# Patient Record
Sex: Male | Born: 1935
Health system: Southern US, Community
[De-identification: ages and names within clinical notes are randomized; demographics above are authoritative.]

## PROBLEM LIST (undated history)

## (undated) DIAGNOSIS — H269 Unspecified cataract: Secondary | ICD-10-CM

## (undated) DIAGNOSIS — N183 Chronic kidney disease, stage 3 unspecified: Secondary | ICD-10-CM

## (undated) DIAGNOSIS — Z8719 Personal history of other diseases of the digestive system: Secondary | ICD-10-CM

## (undated) DIAGNOSIS — R079 Chest pain, unspecified: Secondary | ICD-10-CM

## (undated) DIAGNOSIS — K219 Gastro-esophageal reflux disease without esophagitis: Secondary | ICD-10-CM

## (undated) DIAGNOSIS — I4891 Unspecified atrial fibrillation: Secondary | ICD-10-CM

## (undated) DIAGNOSIS — I10 Essential (primary) hypertension: Secondary | ICD-10-CM

## (undated) DIAGNOSIS — C4490 Unspecified malignant neoplasm of skin, unspecified: Secondary | ICD-10-CM

## (undated) DIAGNOSIS — F1910 Other psychoactive substance abuse, uncomplicated: Secondary | ICD-10-CM

## (undated) DIAGNOSIS — K2270 Barrett's esophagus without dysplasia: Secondary | ICD-10-CM

## (undated) DIAGNOSIS — E785 Hyperlipidemia, unspecified: Secondary | ICD-10-CM

## (undated) HISTORY — DX: Gastro-esophageal reflux disease without esophagitis: K21.9

## (undated) HISTORY — DX: Personal history of other diseases of the digestive system: Z87.19

## (undated) HISTORY — PX: UPPER GASTROINTESTINAL ENDOSCOPY: SHX188

## (undated) HISTORY — DX: Unspecified malignant neoplasm of skin, unspecified: C44.90

## (undated) HISTORY — DX: Hyperlipidemia, unspecified: E78.5

## (undated) HISTORY — PX: WISDOM TOOTH EXTRACTION: SHX21

## (undated) HISTORY — PX: COLONOSCOPY: SHX174

## (undated) HISTORY — DX: Essential (primary) hypertension: I10

## (undated) HISTORY — DX: Chronic kidney disease, stage 3 (moderate): N18.3

## (undated) HISTORY — DX: Chest pain, unspecified: R07.9

## (undated) HISTORY — DX: Chronic kidney disease, stage 3 unspecified: N18.30

## (undated) HISTORY — DX: Other psychoactive substance abuse, uncomplicated: F19.10

## (undated) HISTORY — DX: Unspecified cataract: H26.9

## (undated) HISTORY — DX: Barrett's esophagus without dysplasia: K22.70

## (undated) HISTORY — DX: Unspecified atrial fibrillation: I48.91

## (undated) HISTORY — PX: SKIN CANCER EXCISION: SHX779

---

## 1978-12-06 HISTORY — PX: WISDOM TOOTH EXTRACTION: SHX21

## 2005-04-07 ENCOUNTER — Ambulatory Visit: Payer: Self-pay | Admitting: Family Medicine

## 2005-04-23 ENCOUNTER — Ambulatory Visit: Payer: Self-pay | Admitting: Family Medicine

## 2005-05-07 ENCOUNTER — Ambulatory Visit: Payer: Self-pay | Admitting: Family Medicine

## 2005-11-22 ENCOUNTER — Ambulatory Visit: Payer: Self-pay | Admitting: Family Medicine

## 2005-12-15 ENCOUNTER — Ambulatory Visit: Payer: Self-pay | Admitting: Internal Medicine

## 2005-12-16 ENCOUNTER — Ambulatory Visit: Payer: Self-pay | Admitting: Internal Medicine

## 2005-12-16 ENCOUNTER — Encounter (INDEPENDENT_AMBULATORY_CARE_PROVIDER_SITE_OTHER): Payer: Self-pay | Admitting: Specialist

## 2007-12-08 ENCOUNTER — Ambulatory Visit: Payer: Self-pay | Admitting: Internal Medicine

## 2007-12-22 ENCOUNTER — Ambulatory Visit: Payer: Self-pay | Admitting: Internal Medicine

## 2007-12-22 ENCOUNTER — Encounter: Payer: Self-pay | Admitting: Internal Medicine

## 2008-08-02 ENCOUNTER — Encounter: Admission: RE | Admit: 2008-08-02 | Discharge: 2008-08-02 | Payer: Self-pay | Admitting: Internal Medicine

## 2008-12-06 HISTORY — PX: KNEE ARTHROSCOPY: SUR90

## 2009-03-05 ENCOUNTER — Encounter: Admission: RE | Admit: 2009-03-05 | Discharge: 2009-03-05 | Payer: Self-pay | Admitting: Orthopaedic Surgery

## 2009-12-09 ENCOUNTER — Encounter (INDEPENDENT_AMBULATORY_CARE_PROVIDER_SITE_OTHER): Payer: Self-pay | Admitting: *Deleted

## 2010-01-19 ENCOUNTER — Encounter (INDEPENDENT_AMBULATORY_CARE_PROVIDER_SITE_OTHER): Payer: Self-pay | Admitting: *Deleted

## 2010-01-21 ENCOUNTER — Ambulatory Visit: Payer: Self-pay | Admitting: Internal Medicine

## 2010-02-02 ENCOUNTER — Ambulatory Visit: Payer: Self-pay | Admitting: Internal Medicine

## 2010-02-03 ENCOUNTER — Encounter: Payer: Self-pay | Admitting: Internal Medicine

## 2010-12-06 HISTORY — PX: CATARACT EXTRACTION, BILATERAL: SHX1313

## 2011-01-05 NOTE — Letter (Signed)
Summary: EGD Instructions  [facility]  [address] [place], [place] [address]   Phone: [phone]  Fax: [phone]       DAI MCADAMS    07-22-1934    MRN: [id number]       Procedure Day /Date:  Monday 02/02/2010     Arrival Time: 8:00 am     Procedure Time: 9:00 am     Location of Procedure:                    _x  _ [facility] (4th Floor)    PREPARATION FOR ENDOSCOPY   OnMonday 2/28  THE DAY OF THE PROCEDURE:  1.   No solid foods, milk or milk products are allowed after midnight the night before your procedure.  2.   Do not drink anything colored red or purple.  Avoid juices with pulp.  No orange juice.  3.  You may drink clear liquids until 7:00 am, which is 2 hours before your procedure.                                                                                                CLEAR LIQUIDS INCLUDE: Water Jello Ice Popsicles Tea (sugar ok, no milk/cream) Powdered fruit flavored drinks Coffee (sugar ok, no milk/cream) Gatorade Juice: apple, white grape, white cranberry  Lemonade Clear bullion, consomm, broth Carbonated beverages (any kind) Strained chicken noodle soup Hard Candy   MEDICATION INSTRUCTIONS  Unless otherwise instructed, you should take regular prescription medications with a small sip of water as early as possible the morning of your procedure.              OTHER INSTRUCTIONS  You will need a responsible adult at least 75 years of age to accompany you and drive you home.   This person must remain in the waiting room during your procedure.  Wear loose fitting clothing that is easily removed.  Leave jewelry and other valuables at home.  However, you may wish to bring a book to read or an iPod/MP3 player to listen to music as you wait for your procedure to start.  Remove all body piercing jewelry and leave at home.  Total time from sign-in until discharge is approximately 2-3 hours.  You  should go home directly after your procedure and rest.  You can resume normal activities the day after your procedure.  The day of your procedure you should not:   Drive   Make legal decisions   Operate machinery   Drink alcohol   Return to work  You will receive specific instructions about eating, activities and medications before you leave.    The above instructions have been reviewed and explained to me by   Ezra Sites RN  January 21, 2010 8:09 AM    I fully understand and can verbalize these instructions _____________________________ Date _________

## 2011-01-05 NOTE — Letter (Signed)
Summary: Patient Notice-[facility]  [address] [place], [place] [address]   Phone: [phone]  Fax: [phone]        February 03, 2010 MRN: [id number]    ISHAAQ PENNA [address] [place], [place]  [address]    Dear Mr. VISCOMI,  I am pleased to inform you that the biopsies taken during your recent endoscopic examination did not show any evidence of cancer upon pathologic examination.  However, your biopsies indicate you have a condition known as Barrett's esophagus. While not cancer, it is pre-cancerous (can progress to cancer) and needs to be monitored with repeat endoscopic examination and biopsies.  Fortunately, it is quite rare that this develops into cancer, but careful monitoring of the condition along with taking your medication as prescribed is important in reducing the risk of developing cancer.  It is my recommendation that you have a repeat upper gastrointestinal endoscopic examination in 2_ years.  Additional information/recommendations:  __Please call [phone] to schedule a return visit to further      evaluate your condition.  _x_Continue with treatment plan as outlined the day of your exam.  Please call us if you have or develop heartburn, reflux symptoms, any swallowing problems, or if you have questions about your condition that have not been fully answered at this time.  Sincerely,  Hart Carwin MD  This letter has been electronically signed by your physician.  Appended Document: Patient Notice-Barrett's Esopghagus letter mailed 3.3.11

## 2011-01-05 NOTE — Procedures (Signed)
Summary: Upper Endoscopy  Patient: [name] Note: All result statuses are Final unless otherwise noted.  Tests: (1) Upper Endoscopy (EGD)   EGD Upper Endoscopy       DONE     [facility]     [address]     [place], [place]  [address]           ENDOSCOPY PROCEDURE REPORT           PATIENT:  David Bush, David Bush  MR#:  [id number]     BIRTHDATE:  [birth year], 75 yrs. old  GENDER:  male           ENDOSCOPIST:  Hedwig Morton. Juanda Chance, MD     Referred by:  Burton Apley, M.D.           PROCEDURE DATE:  02/02/2010     PROCEDURE:  EGD with biopsy     ASA CLASS:  Class II     INDICATIONS:  Barrett's esophagus on EGD 2007 and again in 12/2007           doing well on Nexiem 40 mg qd           MEDICATIONS:   Versed 7 mg, Fentanyl 50 mcg     TOPICAL ANESTHETIC:  Exactacain Spray           DESCRIPTION OF PROCEDURE:   After the risks benefits and     alternatives of the procedure were thoroughly explained, informed     consent was obtained.  The LB GIF-H180 [id number] endoscope was     introduced through the mouth and advanced to the second portion of     the duodenum, without limitations.  The instrument was slowly     withdrawn as the mucosa was fully examined.     <<PROCEDUREIMAGES>>           Barrett's esophagus was found in the distal esophagus. irregular     z-line, 4 cm Barrett's 38- 42 cm Multiple biopsies were obtained     and sent to pathology (see image1, image7, image6, and image8).     polyps, # of Two polyps were found. fundic gland polyps With     standard forceps, a biopsy was obtained and sent to pathology (see     image5).  Otherwise the examination was normal (see image2,     image3, and image4).    Retroflexed views revealed no     abnormalities.    The scope was then withdrawn from the patient     and the procedure completed.           COMPLICATIONS:  None           ENDOSCOPIC IMPRESSION:     1) Barrett's esophagus in the distal esophagus     2) Polyps,  # of Two polyps     3) Otherwise normal examination     3-4 cm Barrett's esophagus, s/p biopsies to r/o dysplasia     RECOMMENDATIONS:     1) Await biopsy results     2) Anti-reflux regimen to be follow     continue Nexiem 40 mg qd infefinitely           REPEAT EXAM:  In 2 year(s) for.           ______________________________     Hedwig Morton. Juanda Chance, MD           CC:           n.  eSIGNED:   Hedwig Morton. Louretta Tantillo at 02/02/2010 09:35 AM           Foye Spurling, [id number]  Note: An exclamation mark (!) indicates a result that was not dispersed into the flowsheet. Document Creation Date: 02/02/2010 9:36 AM _______________________________________________________________________  (1) Order result status: Final Collection or observation date-time: 02/02/2010 09:25 Requested date-time:  Receipt date-time:  Reported date-time:  Referring Physician:   Ordering Physician: Lina Sar [phone]) Specimen Source:  Source: Launa Grill Order Number: [id number] Lab site:   Appended Document: Upper Endoscopy     Procedures Next Due Date:    EGD: 02/2012

## 2011-01-05 NOTE — Letter (Signed)
Summary: Endoscopy Letter  [facility]  [address] [place], [place] [address]   Phone: [phone]  Fax: [phone]      December 09, 2009 MRN: [id number]   [name] [address] [place], [place]  [address]   Dear Mr. SOLORIO,   According to your medical record, it is time for you to schedule an Endoscopy. Endoscopic screening is recommended for patients with certain upper digestive tract conditions because of associated increased risk for cancers of the upper digestive system.  This letter has been generated based on the recommendations made at the time of your prior procedure. If you feel that in your particular situation this may no longer apply, please contact our office.  Please call our office at [phone]) to schedule this appointment or to update your records at your earliest convenience.  Thank you for cooperating with [place] to provide you with the very best care possible.   Sincerely,  Hedwig Morton. Juanda Chance, M.D.  [facility] [phone]

## 2011-01-05 NOTE — Miscellaneous (Signed)
Summary: [facility]  Clinical Lists Changes  Observations: Added new observation of NKA: T (01/21/2010 7:51)

## 2011-11-26 ENCOUNTER — Other Ambulatory Visit: Payer: Self-pay | Admitting: Dermatology

## 2012-01-27 ENCOUNTER — Encounter: Payer: Self-pay | Admitting: Internal Medicine

## 2012-02-25 ENCOUNTER — Ambulatory Visit (AMBULATORY_SURGERY_CENTER): Payer: Medicare Other | Admitting: *Deleted

## 2012-02-25 VITALS — Ht 73.0 in | Wt 210.0 lb

## 2012-02-25 DIAGNOSIS — K2270 Barrett's esophagus without dysplasia: Secondary | ICD-10-CM

## 2012-03-10 ENCOUNTER — Ambulatory Visit (AMBULATORY_SURGERY_CENTER): Payer: Medicare Other | Admitting: Internal Medicine

## 2012-03-10 ENCOUNTER — Encounter: Payer: Self-pay | Admitting: Internal Medicine

## 2012-03-10 VITALS — BP 127/72 | HR 55 | Temp 98.2°F | Resp 20 | Ht 73.0 in | Wt 210.0 lb

## 2012-03-10 DIAGNOSIS — K2270 Barrett's esophagus without dysplasia: Secondary | ICD-10-CM

## 2012-03-10 MED ORDER — SODIUM CHLORIDE 0.9 % IV SOLN: 500.0000 mL | INTRAVENOUS | Status: DC

## 2012-03-10 NOTE — Progress Notes (Signed)
Patient did not have preoperative order for IV antibiotic SSI prophylaxis. (G8918)Patient did not experience any of the following events: a burn prior to discharge; a fall within the facility; wrong site/side/patient/procedure/implant event; or a hospital transfer or hospital admission upon discharge from the facility. (G8907)Patient did not experience any of the following events: a burn prior to discharge; a fall within the facility; wrong site/side/patient/procedure/implant event; or a hospital transfer or hospital admission upon discharge from the facility. (G8907) 

## 2012-03-10 NOTE — Patient Instructions (Signed)
YOU HAD AN ENDOSCOPIC PROCEDURE TODAY AT THE [facility]: Refer to the procedure report that was given to you for any specific questions about what was found during the examination.  If the procedure report does not answer your questions, please call your gastroenterologist to clarify.  If you requested that your care partner not be given the details of your procedure findings, then the procedure report has been included in a sealed envelope for you to review at your convenience later.  YOU SHOULD EXPECT: Some feelings of bloating in the abdomen. Passage of more gas than usual.  Walking can help get rid of the air that was put into your GI tract during the procedure and reduce the bloating. If you had a lower endoscopy (such as a colonoscopy or flexible sigmoidoscopy) you may notice spotting of blood in your stool or on the toilet paper. If you underwent a bowel prep for your procedure, then you may not have a normal bowel movement for a few days.  DIET: Your first meal following the procedure should be a light meal and then it is ok to progress to your normal diet.  A half-sandwich or bowl of soup is an example of a good first meal.  Heavy or fried foods are harder to digest and may make you feel nauseous or bloated.  Likewise meals heavy in dairy and vegetables can cause extra gas to form and this can also increase the bloating.  Drink plenty of fluids but you should avoid alcoholic beverages for 24 hours.  ACTIVITY: Your care partner should take you home directly after the procedure.  You should plan to take it easy, moving slowly for the rest of the day.  You can resume normal activity the day after the procedure however you should NOT DRIVE or use heavy machinery for 24 hours (because of the sedation medicines used during the test).    SYMPTOMS TO REPORT IMMEDIATELY: A gastroenterologist can be reached at any hour.  During normal business hours, 8:30 AM to 5:00 PM Monday through Friday,  call [phone].  After hours and on weekends, please call the GI answering service at [phone] who will take a message and have the physician on call contact you.   Following lower endoscopy (colonoscopy or flexible sigmoidoscopy):  Excessive amounts of blood in the stool  Significant tenderness or worsening of abdominal pains  Swelling of the abdomen that is new, acute  Fever of 100F or higher  Following upper endoscopy (EGD)  Vomiting of blood or coffee ground material  New chest pain or pain under the shoulder blades  Painful or persistently difficult swallowing  New shortness of breath  Fever of 100F or higher  Black, tarry-looking stools  FOLLOW UP: If any biopsies were taken you will be contacted by phone or by letter within the next 1-3 weeks.  Call your gastroenterologist if you have not heard about the biopsies in 3 weeks.  Our staff will call the home number listed on your records the next business day following your procedure to check on you and address any questions or concerns that you may have at that time regarding the information given to you following your procedure. This is a courtesy call and so if there is no answer at the home number and we have not heard from you through the emergency physician on call, we will assume that you have returned to your regular daily activities without incident.  SIGNATURES/CONFIDENTIALITY: You and/or your care   partner have signed paperwork which will be entered into your electronic medical record.  These signatures attest to the fact that that the information above on your After Visit Summary has been reviewed and is understood.  Full responsibility of the confidentiality of this discharge information lies with you and/or your care-partner.  

## 2012-03-10 NOTE — Op Note (Signed)
[facility] [address] [place], [place]  [address]  ENDOSCOPY PROCEDURE REPORT  PATIENT:  David Bush, David Bush  MR#:  [id number] BIRTHDATE:  1934/06/21, 77 yrs. old  GENDER:  male  ENDOSCOPIST:  Hedwig Morton. Juanda Chance, MD Referred by:  Burton Apley, M.D.  PROCEDURE DATE:  03/10/2012 PROCEDURE:  EGD with biopsy, [address] ASA CLASS:  Class II INDICATIONS:  h/o Barrett's Esophagus EGD 2007,2009, 2011 showed Barrett's  MEDICATIONS:   MAC sedation, administered by CRNA, propofol (Diprivan) 120 mg TOPICAL ANESTHETIC:  none  DESCRIPTION OF PROCEDURE:   After the risks benefits and alternatives of the procedure were thoroughly explained, informed consent was obtained.  The LB GIF-H180 [id number] endoscope was introduced through the mouth and advanced to the second portion of the duodenum, without limitations.  The instrument was slowly withdrawn as the mucosa was fully examined. <<PROCEDUREIMAGES>>  irregular Z-line. no esophagitis or a stricture, Multiple biopsies were obtained and sent to pathology (see image1 and image5). r/o barrett's  Otherwise the examination was normal (see image4, image3, and image2).    Retroflexed views revealed no abnormalities.    The scope was then withdrawn from the patient and the procedure completed.  COMPLICATIONS:  None  ENDOSCOPIC IMPRESSION: 1) Irregular Z-line 2) Otherwise normal examination Barrett's esophagus - irregular z-line RECOMMENDATIONS: 1) Await pathology results 2) Anti-reflux regimen to be follow cont Nexiem 40 mg po qd  REPEAT EXAM:  In 2 year(s) for.  ______________________________ Hedwig Morton. Juanda Chance, MD  CC:  n. eSIGNED:   Hedwig Morton. Earl Losee at 03/10/2012 11:29 AM  Foye Spurling, [id number]

## 2012-03-13 ENCOUNTER — Telehealth: Payer: Self-pay | Admitting: *Deleted

## 2012-03-13 NOTE — Telephone Encounter (Signed)
  Follow up Call-  Call back number 03/10/2012  Post procedure Call Back phone  # [phone]  Permission to leave phone message Yes     Patient questions:  Do you have a fever, pain , or abdominal swelling? no Pain Score  0 *  Have you tolerated food without any problems? yes  Have you been able to return to your normal activities? yes  Do you have any questions about your discharge instructions: Diet   no Medications  no Follow up visit  no  Do you have questions or concerns about your Care? no  Actions: * If pain score is 4 or above: No action needed, pain <4.

## 2012-03-14 ENCOUNTER — Encounter: Payer: Self-pay | Admitting: Internal Medicine

## 2013-03-07 ENCOUNTER — Other Ambulatory Visit: Payer: Self-pay | Admitting: Dermatology

## 2013-09-07 ENCOUNTER — Other Ambulatory Visit: Payer: Self-pay | Admitting: Dermatology

## 2014-01-01 ENCOUNTER — Other Ambulatory Visit: Payer: Self-pay | Admitting: Dermatology

## 2014-02-12 ENCOUNTER — Encounter: Payer: Self-pay | Admitting: Internal Medicine

## 2014-02-19 ENCOUNTER — Encounter: Payer: Self-pay | Admitting: Internal Medicine

## 2014-03-22 ENCOUNTER — Other Ambulatory Visit: Payer: Self-pay | Admitting: Dermatology

## 2014-04-10 ENCOUNTER — Ambulatory Visit (AMBULATORY_SURGERY_CENTER): Payer: Self-pay | Admitting: *Deleted

## 2014-04-10 VITALS — Ht 73.0 in | Wt 214.4 lb

## 2014-04-10 DIAGNOSIS — K2270 Barrett's esophagus without dysplasia: Secondary | ICD-10-CM

## 2014-04-10 NOTE — Progress Notes (Signed)
No allergies to eggs or soy. No problems with anesthesia.  Pt given Emmi instructions for colonoscopy  No oxygen use  No diet drug use  

## 2014-04-24 ENCOUNTER — Ambulatory Visit (AMBULATORY_SURGERY_CENTER): Payer: Medicare Other | Admitting: Internal Medicine

## 2014-04-24 ENCOUNTER — Encounter: Payer: Self-pay | Admitting: Internal Medicine

## 2014-04-24 VITALS — BP 117/66 | HR 51 | Temp 96.5°F | Resp 16 | Ht 73.0 in | Wt 210.0 lb

## 2014-04-24 DIAGNOSIS — K2270 Barrett's esophagus without dysplasia: Secondary | ICD-10-CM

## 2014-04-24 MED ORDER — SODIUM CHLORIDE 0.9 % IV SOLN: 500.0000 mL | INTRAVENOUS | Status: DC

## 2014-04-24 NOTE — Patient Instructions (Signed)
Discharge instructions given with verbal understanding. Biopsies taken. Resume previous medications. YOU HAD AN ENDOSCOPIC PROCEDURE TODAY AT THE [facility]: Refer to the procedure report that was given to you for any specific questions about what was found during the examination.  If the procedure report does not answer your questions, please call your gastroenterologist to clarify.  If you requested that your care partner not be given the details of your procedure findings, then the procedure report has been included in a sealed envelope for you to review at your convenience later.  YOU SHOULD EXPECT: Some feelings of bloating in the abdomen. Passage of more gas than usual.  Walking can help get rid of the air that was put into your GI tract during the procedure and reduce the bloating. If you had a lower endoscopy (such as a colonoscopy or flexible sigmoidoscopy) you may notice spotting of blood in your stool or on the toilet paper. If you underwent a bowel prep for your procedure, then you may not have a normal bowel movement for a few days.  DIET: Your first meal following the procedure should be a light meal and then it is ok to progress to your normal diet.  A half-sandwich or bowl of soup is an example of a good first meal.  Heavy or fried foods are harder to digest and may make you feel nauseous or bloated.  Likewise meals heavy in dairy and vegetables can cause extra gas to form and this can also increase the bloating.  Drink plenty of fluids but you should avoid alcoholic beverages for 24 hours.  ACTIVITY: Your care partner should take you home directly after the procedure.  You should plan to take it easy, moving slowly for the rest of the day.  You can resume normal activity the day after the procedure however you should NOT DRIVE or use heavy machinery for 24 hours (because of the sedation medicines used during the test).    SYMPTOMS TO REPORT IMMEDIATELY: A gastroenterologist  can be reached at any hour.  During normal business hours, 8:30 AM to 5:00 PM Monday through Friday, call [phone].  After hours and on weekends, please call the GI answering service at [phone] who will take a message and have the physician on call contact you.   Following upper endoscopy (EGD)  Vomiting of blood or coffee ground material  New chest pain or pain under the shoulder blades  Painful or persistently difficult swallowing  New shortness of breath  Fever of 100F or higher  Black, tarry-looking stools  FOLLOW UP: If any biopsies were taken you will be contacted by phone or by letter within the next 1-3 weeks.  Call your gastroenterologist if you have not heard about the biopsies in 3 weeks.  Our staff will call the home number listed on your records the next business day following your procedure to check on you and address any questions or concerns that you may have at that time regarding the information given to you following your procedure. This is a courtesy call and so if there is no answer at the home number and we have not heard from you through the emergency physician on call, we will assume that you have returned to your regular daily activities without incident.  SIGNATURES/CONFIDENTIALITY: You and/or your care partner have signed paperwork which will be entered into your electronic medical record.  These signatures attest to the fact that that the information above on your After   Visit Summary has been reviewed and is understood.  Full responsibility of the confidentiality of this discharge information lies with you and/or your care-partner. 

## 2014-04-24 NOTE — Progress Notes (Signed)
Called to room to assist during endoscopic procedure.  Patient ID and intended procedure confirmed with present staff. Received instructions for my participation in the procedure from the performing physician.  

## 2014-04-24 NOTE — Op Note (Signed)
[facility]  [address]. [place], [address]   ENDOSCOPY PROCEDURE REPORT  PATIENT: David Bush, David Bush  MR#: [id number] BIRTHDATE: 1934-03-10 , 79  yrs. old GENDER: Male ENDOSCOPIST: Lafayette Dragon, MD REFERRED BY:  Lorene Dy, M.D. PROCEDURE DATE:  04/24/2014 PROCEDURE:  EGD w/ biopsy ASA CLASS:     Class II INDICATIONS:  Barrett's esophagus on prior endoscopies in 2007, 2011 and in April 2013.  Patient remains asymptomatic on Nexium 40 mg every morning. MEDICATIONS: MAC sedation, administered by CRNA and propofol (Diprivan) 100mg  IV TOPICAL ANESTHETIC: none  DESCRIPTION OF PROCEDURE: After the risks benefits and alternatives of the procedure were thoroughly explained, informed consent was obtained.  The LB [id number]-[id number] endoscope was introduced through the mouth and advanced to the second portion of the duodenum. Without limitations.  The instrument was slowly withdrawn as the mucosa was fully examined.      Esophagus: Proximal and mid esophageal mucosa appeared normal. Z line was very irregular, there were tones of gastric mucosa extending 2-3 cm into the esophagus. There was no stricture or active esophagitis. Multiple 4 quadrant biopsies were obtained to rule out dysplasia.[NBI confirmed presence of Barrett's esophagus Stomach: Rugal folds were unremarkable. Gastric antrum show few superficial erosions. Gastric outlet was normal. Retroflexion of the endoscope revealed normal fundus and cardia duodenum: Duodenal bulb and descending duodenum was normal          The scope was then withdrawn from the patient and the procedure completed.  COMPLICATIONS: There were no complications. ENDOSCOPIC IMPRESSION: short segment Barrett's esophagus. Status post biopsies to rule out dysplasia Minimal antral gastritis RECOMMENDATIONS: 1.  Await pathology results 2.  Anti-reflux regimen to be follow 3.  Continue PPI  REPEAT EXAM: for EGD pending  biopsy results.,probably in 2 years  eSigned:  Lafayette Dragon, MD 04/24/2014 10:01 AM   CC:  PATIENT NAME:  David Bush, David Bush MR#: [id number]

## 2014-04-25 ENCOUNTER — Telehealth: Payer: Self-pay | Admitting: *Deleted

## 2014-04-25 NOTE — Telephone Encounter (Signed)
  Follow up Call-  Call back number 04/24/2014 03/10/2012  Post procedure Call Back phone  # [phone]  Permission to leave phone message Yes Yes     Patient questions:  Do you have a fever, pain , or abdominal swelling? no Pain Score  0 *  Have you tolerated food without any problems? yes  Have you been able to return to your normal activities? yes  Do you have any questions about your discharge instructions: Diet   no Medications  no Follow up visit  no  Do you have questions or concerns about your Care? no  Actions: * If pain score is 4 or above: No action needed, pain <4.

## 2014-04-30 ENCOUNTER — Encounter: Payer: Self-pay | Admitting: Internal Medicine

## 2014-05-01 ENCOUNTER — Encounter: Payer: Self-pay | Admitting: *Deleted

## 2014-05-03 ENCOUNTER — Encounter: Payer: Self-pay | Admitting: Internal Medicine

## 2014-07-11 ENCOUNTER — Other Ambulatory Visit: Payer: Self-pay | Admitting: Dermatology

## 2015-01-01 DIAGNOSIS — J029 Acute pharyngitis, unspecified: Secondary | ICD-10-CM | POA: Diagnosis not present

## 2015-01-01 DIAGNOSIS — J209 Acute bronchitis, unspecified: Secondary | ICD-10-CM | POA: Diagnosis not present

## 2015-01-28 DIAGNOSIS — J209 Acute bronchitis, unspecified: Secondary | ICD-10-CM | POA: Diagnosis not present

## 2015-01-28 DIAGNOSIS — I10 Essential (primary) hypertension: Secondary | ICD-10-CM | POA: Diagnosis not present

## 2015-01-29 ENCOUNTER — Ambulatory Visit
Admission: RE | Admit: 2015-01-29 | Discharge: 2015-01-29 | Disposition: A | Discharge status: Home / Self Care | Payer: Medicare Other | Source: Ambulatory Visit | Attending: Internal Medicine | Admitting: Internal Medicine

## 2015-01-29 ENCOUNTER — Other Ambulatory Visit: Payer: Self-pay | Admitting: Internal Medicine

## 2015-01-29 DIAGNOSIS — R05 Cough: Secondary | ICD-10-CM

## 2015-01-29 DIAGNOSIS — R059 Cough, unspecified: Secondary | ICD-10-CM

## 2015-01-29 IMAGING — CR DG CHEST 2V
2 series · 2 of 2 positions shown · non-contrast
Comparison: [DATE]

CLINICAL DATA: Persistent cough for 1 month

EXAM:
CHEST  2 VIEW

[w chest pa]
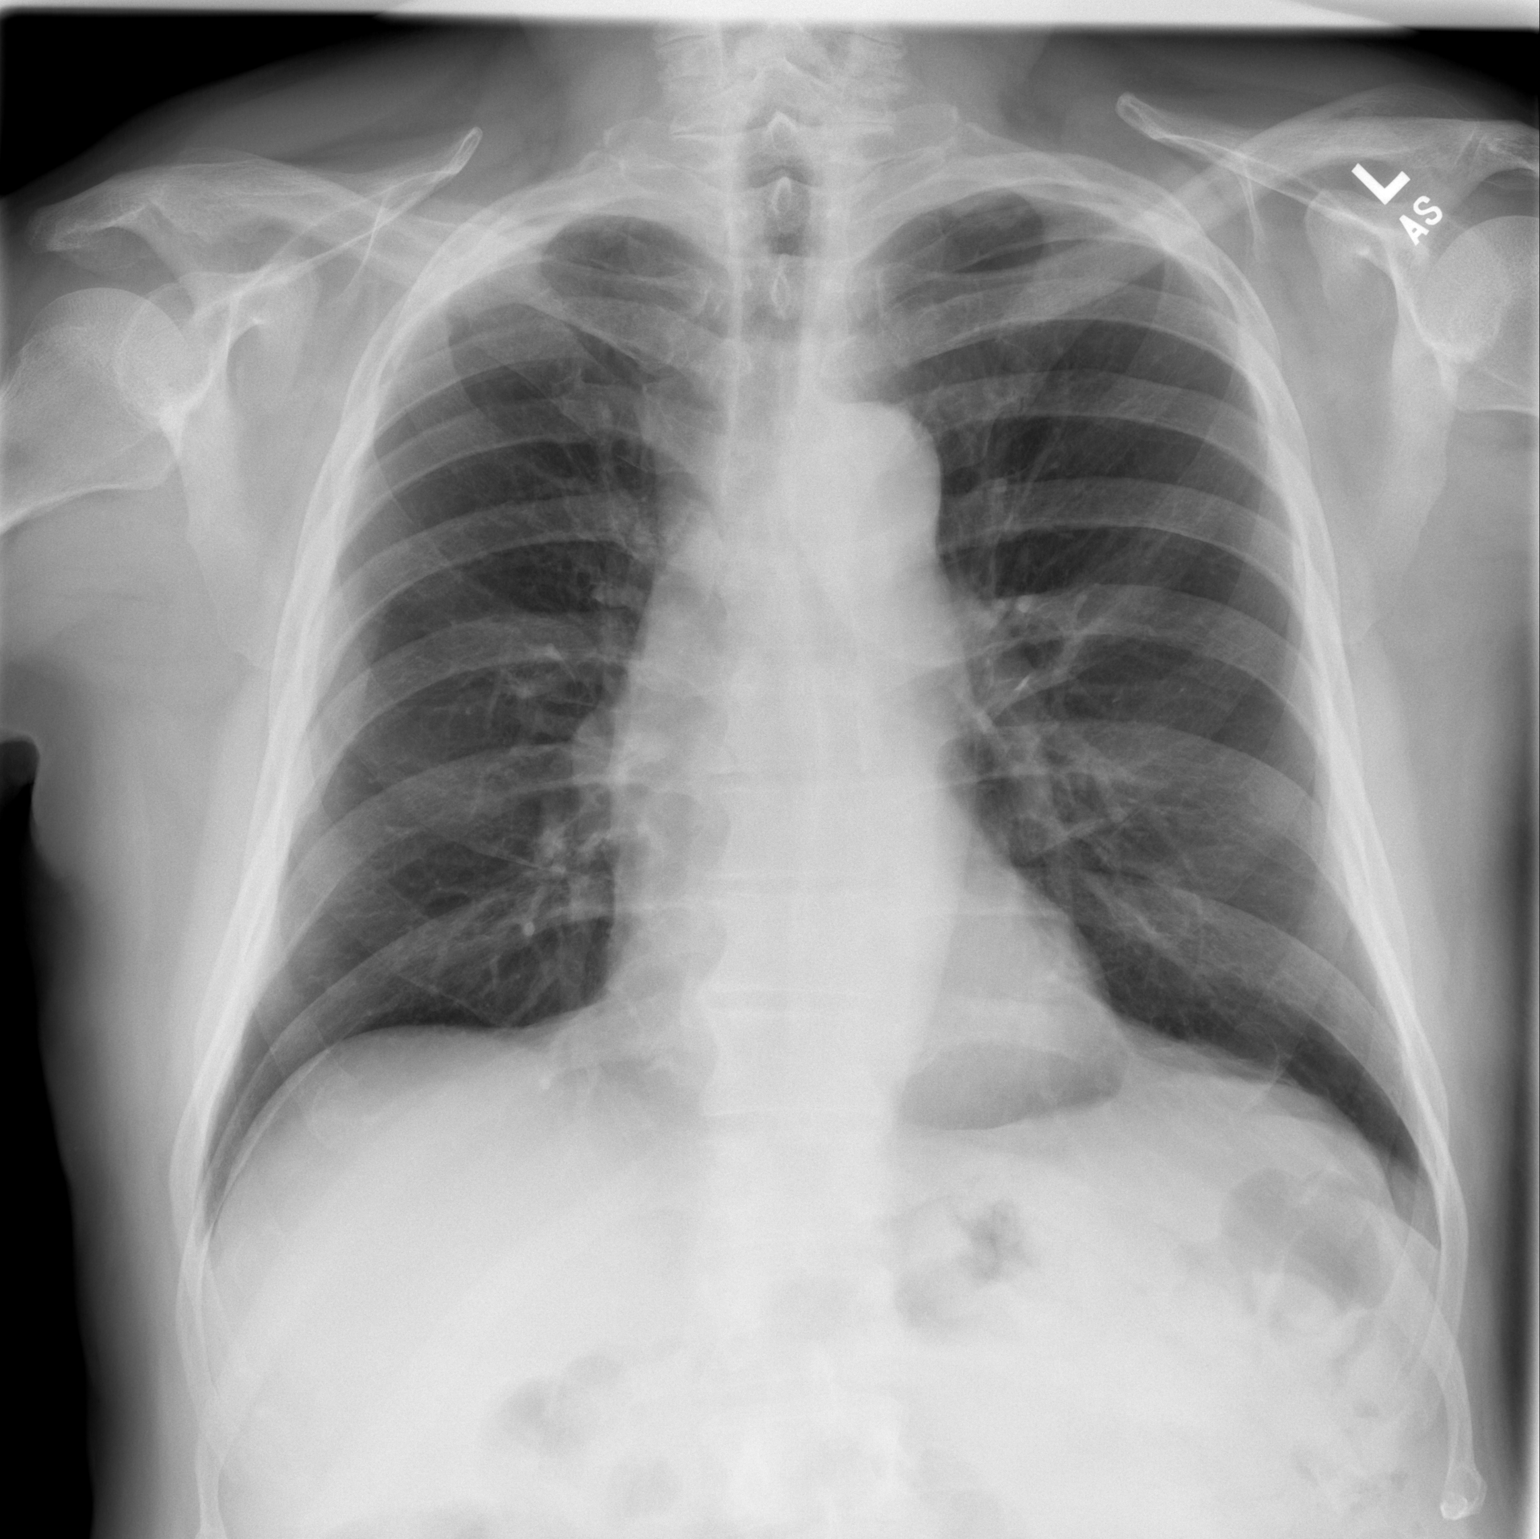

[w chest lat]
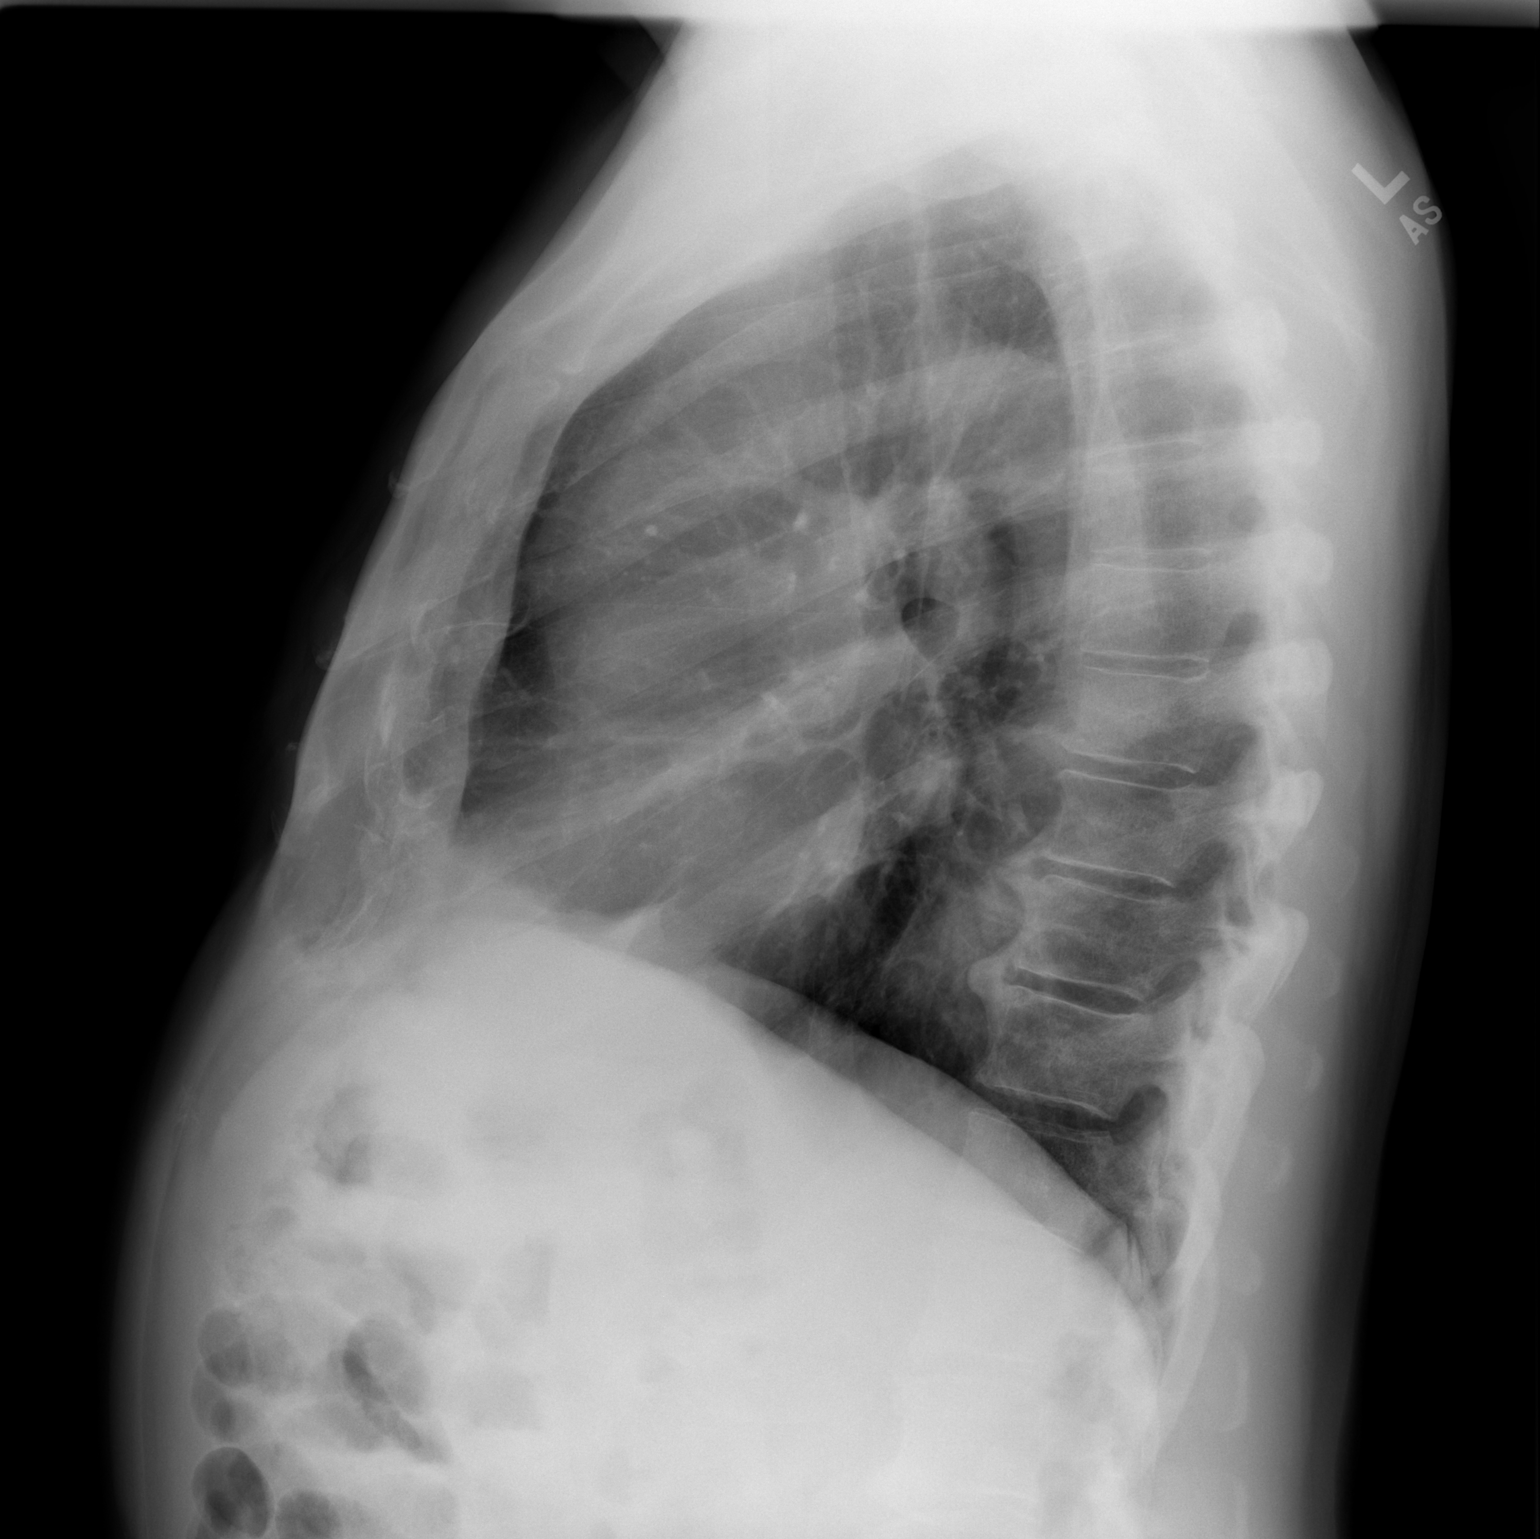

[2 of 2 positions shown; findings below may reference images not displayed]

FINDINGS: Cardiomediastinal silhouette is unremarkable. No acute infiltrate or
pleural effusion. No pulmonary edema. Degenerative changes lower
thoracic spine.
IMPRESSION: No active cardiopulmonary disease.

## 2015-02-10 DIAGNOSIS — N181 Chronic kidney disease, stage 1: Secondary | ICD-10-CM | POA: Diagnosis not present

## 2015-02-10 DIAGNOSIS — R972 Elevated prostate specific antigen [PSA]: Secondary | ICD-10-CM | POA: Diagnosis not present

## 2015-03-12 ENCOUNTER — Other Ambulatory Visit: Payer: Self-pay | Admitting: Dermatology

## 2015-03-12 DIAGNOSIS — L570 Actinic keratosis: Secondary | ICD-10-CM | POA: Diagnosis not present

## 2015-03-12 DIAGNOSIS — C44629 Squamous cell carcinoma of skin of left upper limb, including shoulder: Secondary | ICD-10-CM | POA: Diagnosis not present

## 2015-03-12 DIAGNOSIS — D485 Neoplasm of uncertain behavior of skin: Secondary | ICD-10-CM | POA: Diagnosis not present

## 2015-03-12 DIAGNOSIS — M795 Residual foreign body in soft tissue: Secondary | ICD-10-CM | POA: Diagnosis not present

## 2015-04-03 ENCOUNTER — Other Ambulatory Visit: Payer: Self-pay | Admitting: Dermatology

## 2015-04-03 DIAGNOSIS — C44629 Squamous cell carcinoma of skin of left upper limb, including shoulder: Secondary | ICD-10-CM | POA: Diagnosis not present

## 2015-04-30 DIAGNOSIS — L820 Inflamed seborrheic keratosis: Secondary | ICD-10-CM | POA: Diagnosis not present

## 2015-04-30 DIAGNOSIS — Z85828 Personal history of other malignant neoplasm of skin: Secondary | ICD-10-CM | POA: Diagnosis not present

## 2015-04-30 DIAGNOSIS — L570 Actinic keratosis: Secondary | ICD-10-CM | POA: Diagnosis not present

## 2015-04-30 DIAGNOSIS — L821 Other seborrheic keratosis: Secondary | ICD-10-CM | POA: Diagnosis not present

## 2015-05-12 DIAGNOSIS — N189 Chronic kidney disease, unspecified: Secondary | ICD-10-CM | POA: Diagnosis not present

## 2015-05-12 DIAGNOSIS — R972 Elevated prostate specific antigen [PSA]: Secondary | ICD-10-CM | POA: Diagnosis not present

## 2015-05-12 DIAGNOSIS — I10 Essential (primary) hypertension: Secondary | ICD-10-CM | POA: Diagnosis not present

## 2015-08-25 DIAGNOSIS — I70219 Atherosclerosis of native arteries of extremities with intermittent claudication, unspecified extremity: Secondary | ICD-10-CM | POA: Diagnosis not present

## 2015-08-25 DIAGNOSIS — R002 Palpitations: Secondary | ICD-10-CM | POA: Diagnosis not present

## 2015-08-25 DIAGNOSIS — Z79899 Other long term (current) drug therapy: Secondary | ICD-10-CM | POA: Diagnosis not present

## 2015-08-25 DIAGNOSIS — Z1389 Encounter for screening for other disorder: Secondary | ICD-10-CM | POA: Diagnosis not present

## 2015-08-25 DIAGNOSIS — H9113 Presbycusis, bilateral: Secondary | ICD-10-CM | POA: Diagnosis not present

## 2015-08-25 DIAGNOSIS — R42 Dizziness and giddiness: Secondary | ICD-10-CM | POA: Diagnosis not present

## 2015-08-25 DIAGNOSIS — Z0000 Encounter for general adult medical examination without abnormal findings: Secondary | ICD-10-CM | POA: Diagnosis not present

## 2015-08-25 DIAGNOSIS — I10 Essential (primary) hypertension: Secondary | ICD-10-CM | POA: Diagnosis not present

## 2015-08-25 DIAGNOSIS — Z1211 Encounter for screening for malignant neoplasm of colon: Secondary | ICD-10-CM | POA: Diagnosis not present

## 2015-08-25 DIAGNOSIS — E785 Hyperlipidemia, unspecified: Secondary | ICD-10-CM | POA: Diagnosis not present

## 2015-08-25 DIAGNOSIS — Z131 Encounter for screening for diabetes mellitus: Secondary | ICD-10-CM | POA: Diagnosis not present

## 2015-08-25 DIAGNOSIS — E780 Pure hypercholesterolemia: Secondary | ICD-10-CM | POA: Diagnosis not present

## 2015-08-25 DIAGNOSIS — R1032 Left lower quadrant pain: Secondary | ICD-10-CM | POA: Diagnosis not present

## 2015-08-25 DIAGNOSIS — R5383 Other fatigue: Secondary | ICD-10-CM | POA: Diagnosis not present

## 2015-08-25 DIAGNOSIS — Z125 Encounter for screening for malignant neoplasm of prostate: Secondary | ICD-10-CM | POA: Diagnosis not present

## 2015-09-03 DIAGNOSIS — Z23 Encounter for immunization: Secondary | ICD-10-CM | POA: Diagnosis not present

## 2015-09-12 DIAGNOSIS — L570 Actinic keratosis: Secondary | ICD-10-CM | POA: Diagnosis not present

## 2015-09-12 DIAGNOSIS — L821 Other seborrheic keratosis: Secondary | ICD-10-CM | POA: Diagnosis not present

## 2015-09-12 DIAGNOSIS — Z85828 Personal history of other malignant neoplasm of skin: Secondary | ICD-10-CM | POA: Diagnosis not present

## 2015-10-15 DIAGNOSIS — L821 Other seborrheic keratosis: Secondary | ICD-10-CM | POA: Diagnosis not present

## 2015-10-15 DIAGNOSIS — C44622 Squamous cell carcinoma of skin of right upper limb, including shoulder: Secondary | ICD-10-CM | POA: Diagnosis not present

## 2015-10-15 DIAGNOSIS — D485 Neoplasm of uncertain behavior of skin: Secondary | ICD-10-CM | POA: Diagnosis not present

## 2015-10-29 DIAGNOSIS — H26493 Other secondary cataract, bilateral: Secondary | ICD-10-CM | POA: Diagnosis not present

## 2015-11-25 DIAGNOSIS — L905 Scar conditions and fibrosis of skin: Secondary | ICD-10-CM | POA: Diagnosis not present

## 2015-11-25 DIAGNOSIS — L570 Actinic keratosis: Secondary | ICD-10-CM | POA: Diagnosis not present

## 2015-11-25 DIAGNOSIS — C44622 Squamous cell carcinoma of skin of right upper limb, including shoulder: Secondary | ICD-10-CM | POA: Diagnosis not present

## 2015-12-26 ENCOUNTER — Encounter: Payer: Self-pay | Admitting: Internal Medicine

## 2016-03-24 ENCOUNTER — Encounter: Payer: Self-pay | Admitting: Gastroenterology

## 2016-04-09 DIAGNOSIS — D485 Neoplasm of uncertain behavior of skin: Secondary | ICD-10-CM | POA: Diagnosis not present

## 2016-04-09 DIAGNOSIS — L570 Actinic keratosis: Secondary | ICD-10-CM | POA: Diagnosis not present

## 2016-04-09 DIAGNOSIS — Z85828 Personal history of other malignant neoplasm of skin: Secondary | ICD-10-CM | POA: Diagnosis not present

## 2016-04-09 DIAGNOSIS — C44622 Squamous cell carcinoma of skin of right upper limb, including shoulder: Secondary | ICD-10-CM | POA: Diagnosis not present

## 2016-04-09 DIAGNOSIS — L2084 Intrinsic (allergic) eczema: Secondary | ICD-10-CM | POA: Diagnosis not present

## 2016-04-26 ENCOUNTER — Encounter: Payer: Self-pay | Admitting: Gastroenterology

## 2016-05-10 ENCOUNTER — Ambulatory Visit (AMBULATORY_SURGERY_CENTER): Payer: Self-pay | Admitting: *Deleted

## 2016-05-10 VITALS — Ht 72.0 in | Wt 209.0 lb

## 2016-05-10 DIAGNOSIS — K2270 Barrett's esophagus without dysplasia: Secondary | ICD-10-CM

## 2016-05-10 NOTE — Progress Notes (Signed)
Patient denies any allergies to eggs or soy. Patient denies any problems with anesthesia/sedation. Patient denies any oxygen use at home and does not take any diet/weight loss medications.  

## 2016-05-21 DIAGNOSIS — C44622 Squamous cell carcinoma of skin of right upper limb, including shoulder: Secondary | ICD-10-CM | POA: Diagnosis not present

## 2016-05-24 ENCOUNTER — Encounter: Payer: Self-pay | Admitting: Gastroenterology

## 2016-05-24 ENCOUNTER — Ambulatory Visit (AMBULATORY_SURGERY_CENTER): Payer: Medicare Other | Admitting: Gastroenterology

## 2016-05-24 VITALS — BP 103/55 | HR 50 | Temp 97.8°F | Resp 17 | Ht 72.0 in | Wt 209.0 lb

## 2016-05-24 DIAGNOSIS — I10 Essential (primary) hypertension: Secondary | ICD-10-CM | POA: Diagnosis not present

## 2016-05-24 DIAGNOSIS — K2270 Barrett's esophagus without dysplasia: Secondary | ICD-10-CM

## 2016-05-24 MED ORDER — SODIUM CHLORIDE 0.9 % IV SOLN: 500.0000 mL | INTRAVENOUS | Status: DC

## 2016-05-24 NOTE — Op Note (Addendum)
[place] Patient Name: Romelle Glanton Procedure Date: 05/24/2016 10:08 AM MRN: [id number] Endoscopist: Mallie Mussel L. Loletha Carrow , MD Age: 82 Referring MD:  Date of Birth: 1934/07/17 Gender: Male Account #: [id number] Procedure:                Upper GI endoscopy Indications:              Surveillance for malignancy due to personal history                            of Barrett's esophagus Medicines:                Monitored Anesthesia Care Procedure:                Pre-Anesthesia Assessment:                           - Prior to the procedure, a History and Physical                            was performed, and patient medications and                            allergies were reviewed. The patient's tolerance of                            previous anesthesia was also reviewed. The risks                            and benefits of the procedure and the sedation                            options and risks were discussed with the patient.                            All questions were answered, and informed consent                            was obtained. Prior Anticoagulants: The patient has                            taken no previous anticoagulant or antiplatelet                            agents. ASA Grade Assessment: II - A patient with                            mild systemic disease. After reviewing the risks                            and benefits, the patient was deemed in                            satisfactory condition to undergo the procedure.  After obtaining informed consent, the endoscope was                            passed under direct vision. Throughout the                            procedure, the patient's blood pressure, pulse, and                            oxygen saturations were monitored continuously. The                            Model GIF-HQ190 [id number]) scope was introduced                            through the mouth, and  advanced to the second part                            of duodenum. The upper GI endoscopy was                            accomplished without difficulty. The patient                            tolerated the procedure well. Scope In: Scope Out: Findings:                 The esophagus and gastroesophageal junction were                            examined with white light. There were esophageal                            mucosal changes secondary to established                            short-segment Barrett's disease. There were two                            tongues and one island of Barrett's epithelium.                            These non-circumferential changes involved the                            mucosa at the upper extent of the gastric folds                            (39-40 cm from the incisors) extending to the                            Z-line (37 cm from the incisors). The maximum                            longitudinal  extent of these esophageal mucosal                            changes was therefore 3 cm in length. There were no                            suspicious lesions. Mucosa was biopsied with a cold                            forceps A total of 2 specimen bottles were sent to                            pathology.                           The stomach was normal.                           The cardia and gastric fundus were normal on                            retroflexion.                           The examined duodenum was normal. Complications:            No immediate complications. Estimated Blood Loss:     Estimated blood loss was minimal. Impression:               - Esophageal mucosal changes secondary to                            established short-segment Barrett's disease.                            Biopsied.                           - Normal stomach.                           - Normal examined duodenum. Recommendation:           - Patient has a contact  number available for                            emergencies. The signs and symptoms of potential                            delayed complications were discussed with the                            patient. Return to normal activities tomorrow.                            Written discharge instructions were provided to the  patient.                           - Resume previous diet.                           - Continue present medications.                           - Await pathology results. Henry L. Loletha Carrow, MD 05/24/2016 10:32:37 AM This report has been signed electronically.

## 2016-05-24 NOTE — Progress Notes (Signed)
Report given to PACU RN, vss 

## 2016-05-24 NOTE — Patient Instructions (Signed)
YOU HAD AN ENDOSCOPIC PROCEDURE TODAY AT THE [facility]:   Refer to the procedure report that was given to you for any specific questions about what was found during the examination.  If the procedure report does not answer your questions, please call your gastroenterologist to clarify.  If you requested that your care partner not be given the details of your procedure findings, then the procedure report has been included in a sealed envelope for you to review at your convenience later.  YOU SHOULD EXPECT: Some feelings of bloating in the abdomen. Passage of more gas than usual.  Walking can help get rid of the air that was put into your GI tract during the procedure and reduce the bloating. If you had a lower endoscopy (such as a colonoscopy or flexible sigmoidoscopy) you may notice spotting of blood in your stool or on the toilet paper. If you underwent a bowel prep for your procedure, you may not have a normal bowel movement for a few days.  Please Note:  You might notice some irritation and congestion in your nose or some drainage.  This is from the oxygen used during your procedure.  There is no need for concern and it should clear up in a day or so.  SYMPTOMS TO REPORT IMMEDIATELY:    Following upper endoscopy (EGD)  Vomiting of blood or coffee ground material  New chest pain or pain under the shoulder blades  Painful or persistently difficult swallowing  New shortness of breath  Fever of 100F or higher  Black, tarry-looking stools  For urgent or emergent issues, a gastroenterologist can be reached at any hour by calling [phone].   DIET: Your first meal following the procedure should be a small meal and then it is ok to progress to your normal diet. Heavy or fried foods are harder to digest and may make you feel nauseous or bloated.  Likewise, meals heavy in dairy and vegetables can increase bloating.  Drink plenty of fluids but you should avoid alcoholic beverages  for 24 hours.  ACTIVITY:  You should plan to take it easy for the rest of today and you should NOT DRIVE or use heavy machinery until tomorrow (because of the sedation medicines used during the test).    FOLLOW UP: Our staff will call the number listed on your records the next business day following your procedure to check on you and address any questions or concerns that you may have regarding the information given to you following your procedure. If we do not reach you, we will leave a message.  However, if you are feeling well and you are not experiencing any problems, there is no need to return our call.  We will assume that you have returned to your regular daily activities without incident.  If any biopsies were taken you will be contacted by phone or by letter within the next 1-3 weeks.  Please call us at [phone] if you have not heard about the biopsies in 3 weeks.    SIGNATURES/CONFIDENTIALITY: You and/or your care partner have signed paperwork which will be entered into your electronic medical record.  These signatures attest to the fact that that the information above on your After Visit Summary has been reviewed and is understood.  Full responsibility of the confidentiality of this discharge information lies with you and/or your care-partner. 

## 2016-05-24 NOTE — Progress Notes (Signed)
Called to room to assist during endoscopic procedure.  Patient ID and intended procedure confirmed with present staff. Received instructions for my participation in the procedure from the performing physician.  

## 2016-05-25 ENCOUNTER — Telehealth: Payer: Self-pay

## 2016-05-25 NOTE — Telephone Encounter (Signed)
  Follow up Call-  Call back number 05/24/2016 04/24/2014  Post procedure Call Back phone  # [phone]  Permission to leave phone message Yes Yes     Patient questions:  Do you have a fever, pain , or abdominal swelling? No. Pain Score  0 *  Have you tolerated food without any problems? Yes.    Have you been able to return to your normal activities? Yes.    Do you have any questions about your discharge instructions: Diet   No. Medications  No. Follow up visit  No.  Do you have questions or concerns about your Care? No.  Actions: * If pain score is 4 or above: No action needed, pain <4.

## 2016-05-28 ENCOUNTER — Encounter: Payer: Self-pay | Admitting: Gastroenterology

## 2016-08-11 ENCOUNTER — Other Ambulatory Visit: Payer: Self-pay | Admitting: Internal Medicine

## 2016-08-11 DIAGNOSIS — R1031 Right lower quadrant pain: Secondary | ICD-10-CM

## 2016-08-11 DIAGNOSIS — R1032 Left lower quadrant pain: Secondary | ICD-10-CM | POA: Diagnosis not present

## 2016-08-14 ENCOUNTER — Emergency Department (HOSPITAL_COMMUNITY)
Admission: EM | Admit: 2016-08-14 | Discharge: 2016-08-14 | Disposition: A | Discharge status: Home / Self Care | Payer: Medicare Other | Attending: Emergency Medicine | Admitting: Emergency Medicine

## 2016-08-14 ENCOUNTER — Emergency Department (HOSPITAL_COMMUNITY): Payer: Medicare Other

## 2016-08-14 ENCOUNTER — Encounter (HOSPITAL_COMMUNITY): Payer: Self-pay

## 2016-08-14 DIAGNOSIS — I10 Essential (primary) hypertension: Secondary | ICD-10-CM | POA: Diagnosis not present

## 2016-08-14 DIAGNOSIS — S39012A Strain of muscle, fascia and tendon of lower back, initial encounter: Secondary | ICD-10-CM | POA: Diagnosis not present

## 2016-08-14 DIAGNOSIS — T148 Other injury of unspecified body region: Secondary | ICD-10-CM | POA: Diagnosis not present

## 2016-08-14 DIAGNOSIS — Z7982 Long term (current) use of aspirin: Secondary | ICD-10-CM | POA: Diagnosis not present

## 2016-08-14 DIAGNOSIS — Z79899 Other long term (current) drug therapy: Secondary | ICD-10-CM | POA: Insufficient documentation

## 2016-08-14 DIAGNOSIS — Z87891 Personal history of nicotine dependence: Secondary | ICD-10-CM | POA: Diagnosis not present

## 2016-08-14 DIAGNOSIS — T148XXA Other injury of unspecified body region, initial encounter: Secondary | ICD-10-CM

## 2016-08-14 DIAGNOSIS — R109 Unspecified abdominal pain: Secondary | ICD-10-CM | POA: Diagnosis not present

## 2016-08-14 DIAGNOSIS — M5136 Other intervertebral disc degeneration, lumbar region: Secondary | ICD-10-CM | POA: Diagnosis not present

## 2016-08-14 DIAGNOSIS — Z85828 Personal history of other malignant neoplasm of skin: Secondary | ICD-10-CM | POA: Diagnosis not present

## 2016-08-14 LAB — URINALYSIS, ROUTINE W REFLEX MICROSCOPIC
Bilirubin Urine: NEGATIVE
GLUCOSE, UA: NEGATIVE mg/dL
HGB URINE DIPSTICK: NEGATIVE
Ketones, ur: NEGATIVE mg/dL
Nitrite: NEGATIVE
PROTEIN: NEGATIVE mg/dL
Specific Gravity, Urine: 1.018 (ref 1.005–1.030)
pH: 5.5 (ref 5.0–8.0)

## 2016-08-14 LAB — URINE MICROSCOPIC-ADD ON

## 2016-08-14 NOTE — ED Notes (Signed)
Pt stated right sided back pain. Dull pain that will move to the middle of his back.

## 2016-08-14 NOTE — ED Triage Notes (Signed)
Pt with rt flank pain since Monday.  No fever.  No n/v.  Pt wonders if pulled muscle.  Did not feel but has been lifting.  Denies urinary symptoms.

## 2016-08-14 NOTE — ED Provider Notes (Signed)
[place] DEPT Provider Note   CSN: [id number] Arrival date & time: 08/14/16  [id number]     History   Chief Complaint Chief Complaint  Patient presents with  . Flank Pain    HPI David Bush is a 82 y.o. male.  Patient is an 82 year old male with a history of Barrett's esophagus, hypertension, recovering alcoholic presenting today with a six-day history of right-sided back and side pain. He states it started Monday night and has not improved. Symptoms seem to be the worst at night especially when he rolls over in bed. He does notice it during the day but is not as bad. Laying flat on his back gives him complete relief and he describes the pain as a tight pulling sensation. He denies any radiation of the pain. He denies any numbness, tingling or leg pain in either of his lower extremities. No dysuria, hematuria or pain in his back with urination. He denies any change in bowel movements. Eating does not affect the pain. He does recall on Monday he pulled someone chairs out of the trunk of his car and several hours later is when the pain started. He denies any other trauma or fall. Patient used to drink alcohol but has been dry for 23 years. He denies any new medications. He states he has never had pain like this before. When he takes Tylenol it does help him sleep better.      Past Medical History:  Diagnosis Date  . Barrett's esophagus   . Cancer ([place])   . Hyperlipidemia   . Hypertension   . Skin cancer    squamous cell of face    There are no active problems to display for this patient.   Past Surgical History:  Procedure Laterality Date  . CATARACT EXTRACTION, BILATERAL  2012  . KNEE ARTHROSCOPY Left 2010   torn meniscus  . SKIN CANCER EXCISION  2015, 2012  . UPPER GASTROINTESTINAL ENDOSCOPY     barrett's esophagus  . [place] Medications    Prior to Admission medications   Medication Sig Start Date End Date Taking?  Authorizing Provider  acetaminophen (TYLENOL) 500 MG tablet Take 1,000 mg by mouth every 6 (six) hours as needed for moderate pain.   Yes Historical Provider, MD  aspirin 81 MG tablet Take 81 mg by mouth at bedtime.    Yes Historical Provider, MD  atenolol (TENORMIN) 50 MG tablet Take 25 mg by mouth daily.    Yes Historical Provider, MD  benazepril (LOTENSIN) 10 MG tablet Take 10 mg by mouth daily.   Yes Historical Provider, MD  chlorthalidone (HYGROTON) 25 MG tablet Take 12.5 mg by mouth daily. 06/16/16  Yes Historical Provider, MD  esomeprazole (NEXIUM) 40 MG capsule Take 40 mg by mouth at bedtime.    Yes Historical Provider, MD  Multiple Vitamins-Minerals (CENTRUM SILVER PO) Take 1 tablet by mouth daily.   Yes Historical Provider, MD  simvastatin (ZOCOR) 10 MG tablet Take 10 mg by mouth at bedtime.   Yes Historical Provider, MD  vitamin C (ASCORBIC ACID) 500 MG tablet Take 500 mg by mouth daily.   Yes Historical Provider, MD    Family History Family History  Problem Relation Age of Onset  . Esophageal cancer Mother   . Colon cancer Neg Hx     Social History Social History  Substance Use Topics  . Smoking status: Former Smoker    Quit date: 12/06/1966  .  Smokeless tobacco: Never Used  . 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     Allergies   Review of patient's allergies indicates no known allergies.   Review of Systems Review of Systems  All other systems reviewed and are negative.    Physical Exam Updated Vital Signs BP 132/82 (BP Location: Right Arm)   Pulse (!) 53   Temp 97.9 F (36.6 C) (Oral)   Resp 16   SpO2 98%   Physical Exam  Constitutional: He is oriented to person, place, and time. He appears well-developed and well-nourished. No distress.  HENT:  Head: Normocephalic and atraumatic.  Mouth/Throat: Oropharynx is clear and moist.  Eyes: Conjunctivae and EOM are normal. Pupils are equal, round, and reactive to light.  Neck: Normal  range of motion. Neck supple.  Cardiovascular: Normal rate, regular rhythm and intact distal pulses.   No murmur heard. Pulmonary/Chest: Effort normal and breath sounds normal. No respiratory distress. He has no wheezes. He has no rales.  Abdominal: Soft. He exhibits no distension. There is no tenderness. There is no rebound, no guarding and no CVA tenderness.  Musculoskeletal: Normal range of motion. He exhibits no edema or tenderness.       Back:  Neurological: He is alert and oriented to person, place, and time.  Skin: Skin is warm and dry. No rash noted. No erythema.  Psychiatric: He has a normal mood and affect. His behavior is normal.  Nursing note and vitals reviewed.    ED Treatments / Results  Labs (all labs ordered are listed, but only abnormal results are displayed) Labs Reviewed  URINALYSIS, ROUTINE W REFLEX MICROSCOPIC (NOT AT [facility]) - Abnormal; Notable for the following:       Result Value   Leukocytes, UA TRACE (*)    All other components within normal limits  URINE MICROSCOPIC-ADD ON - Abnormal; Notable for the following:    Squamous Epithelial / LPF 0-5 (*)    Bacteria, UA RARE (*)    All other components within normal limits    EKG  EKG Interpretation None       Radiology Ct Abdomen Pelvis Wo Contrast  Result Date: 08/14/2016 CLINICAL DATA:  Patient with right flank pain since Monday EXAM: CT ABDOMEN AND PELVIS WITHOUT CONTRAST TECHNIQUE: Multidetector CT imaging of the abdomen and pelvis was performed following the standard protocol without IV contrast. COMPARISON:  None. FINDINGS: Lower chest: Mild dependent atelectasis. No acute infiltrate or large effusion. Hepatobiliary: Isolated granuloma in the left hepatic lobe. Tiny 7 mm hypodensity within the peripheral left hepatic lobe, series 2., image number 11, too small to further characterize, possible tiny cyst. No calcified gallstones identified. No biliary dilatation identified. Pancreas: Noncontrast  appearance is within normal limits. Spleen: Non contrasted appearance within normal limits. Adrenals/Urinary Tract: Adrenal glands within normal limits. Nonspecific bilateral perinephric fat stranding. No evidence for hydronephrosis or hydroureter. No calcified stones are visualized within the kidneys or along the course of the ureters. Urinary bladder is unremarkable. Stomach/Bowel: Mild air filled distal esophagus. Gastric wall appears thickened but the stomach is nondistended. Small amount of radiodense material within the proximal duodenum. There is no dilated small or large bowel. Moderate stool burden in the colon. Minimal high attenuation within the appendix, but no evidence for acute inflammation. Multiple colonic diverticula, without evidence for diverticulitis. Vascular/Lymphatic: Mild atherosclerotic vascular calcification. No significantly enlarged mesenteric or retroperitoneal nodes. Reproductive: Coarse calcifications within the prostate gland. Fat filled inguinal hernias.  Other: No free air or free fluid. Musculoskeletal: Mild retrolisthesis of L3 on L4. Moderate-to-marked narrowing at L3-L4, L4-L5 and L5-S1 with vacuum discs and osteophytes. Bilateral facet disease also present. No acute osseous abnormality. IMPRESSION: 1. No evidence for nephrolithiasis, hydronephrosis, or hydroureter. Nonspecific bilateral perinephric fat stranding. 2. 7 mm hypodense probable cyst in the peripheral left hepatic lobe 3. Colonic diverticulosis without CT evidence for diverticulitis. 4. Mild atherosclerotic vascular calcifications. 5. Coarse prostatic calcification. 6. Moderate to marked degenerative disc disease of the lumbar spine Electronically Signed   By: Donavan Foil M.D.   On: 08/14/2016 09:23    Procedures Procedures (including critical care time)  Medications Ordered in ED Medications - No data to display   Initial Impression / Assessment and Plan / ED Course  I have reviewed the triage vital  signs and the nursing notes.  Pertinent labs & imaging results that were available during my care of the patient were reviewed by me and considered in my medical decision making (see chart for details).  Clinical Course   Patient is an 82 year old male complaining of right flank pain and side pain that started 6 days ago. Particular movements seem to make it worse but he has no other symptoms such as dysuria, GI symptoms, rashes. The pain does not radiate down his leg. Symptoms may be related to muscular strain with lower suspicion for AAA or dissection. Patient has good pulses distally with normal strength and sensation. Also potential for kidney stone versus other kidney pathology. UA without any hematuria however patient had seen his PCP earlier this week and has a CAT scan scheduled for Tuesday which will be done today. He denies any GI symptoms and has no abdominal tenderness on exam. Patient is reproduced by twisting but no significant tenderness with palpation. CAT scan pending  9:59 AM CT without significant findings. Other than lumbar disc disease and osteophytes. Patient's symptoms are most likely related to this. No sign of aortic aneurysm, renal mass or kidney stone. Patient will continue doing Tylenol and ibuprofen. Given strict return precautions and follow-up.  Final Clinical Impressions(s) / ED Diagnoses   Final diagnoses:  Muscle strain  Degenerative disc disease, lumbar    New Prescriptions New Prescriptions   No medications on file     Blanchie Dessert, MD 08/14/16 1003

## 2016-08-17 ENCOUNTER — Other Ambulatory Visit: Payer: Medicare Other

## 2016-08-20 ENCOUNTER — Encounter: Payer: Self-pay | Admitting: Family Medicine

## 2016-08-20 ENCOUNTER — Ambulatory Visit (INDEPENDENT_AMBULATORY_CARE_PROVIDER_SITE_OTHER): Payer: Medicare Other | Admitting: Family Medicine

## 2016-08-20 VITALS — BP 118/64 | HR 56 | Temp 97.9°F | Resp 16 | Ht 73.0 in | Wt 210.0 lb

## 2016-08-20 DIAGNOSIS — I10 Essential (primary) hypertension: Secondary | ICD-10-CM

## 2016-08-20 DIAGNOSIS — E785 Hyperlipidemia, unspecified: Secondary | ICD-10-CM | POA: Diagnosis not present

## 2016-08-20 DIAGNOSIS — Z7689 Persons encountering health services in other specified circumstances: Secondary | ICD-10-CM

## 2016-08-20 DIAGNOSIS — K2270 Barrett's esophagus without dysplasia: Secondary | ICD-10-CM | POA: Diagnosis not present

## 2016-08-20 DIAGNOSIS — Z0000 Encounter for general adult medical examination without abnormal findings: Secondary | ICD-10-CM | POA: Diagnosis not present

## 2016-08-20 DIAGNOSIS — M5136 Other intervertebral disc degeneration, lumbar region: Secondary | ICD-10-CM

## 2016-08-20 DIAGNOSIS — Z79899 Other long term (current) drug therapy: Secondary | ICD-10-CM | POA: Diagnosis not present

## 2016-08-20 DIAGNOSIS — Z7189 Other specified counseling: Secondary | ICD-10-CM | POA: Diagnosis not present

## 2016-08-20 DIAGNOSIS — Z125 Encounter for screening for malignant neoplasm of prostate: Secondary | ICD-10-CM | POA: Diagnosis not present

## 2016-08-20 LAB — CBC WITH DIFFERENTIAL/PLATELET
BASOS ABS: 71 {cells}/uL (ref 0–200)
BASOS PCT: 1 %
EOS ABS: 142 {cells}/uL (ref 15–500)
Eosinophils Relative: 2 %
HEMATOCRIT: 43.5 % (ref 38.5–50.0)
Hemoglobin: 14.9 g/dL (ref 13.0–17.0)
LYMPHS PCT: 25 %
Lymphs Abs: 1775 cells/uL (ref 850–3900)
MCH: 30.5 pg (ref 27.0–33.0)
MCHC: 34.3 g/dL (ref 32.0–36.0)
MCV: 89.1 fL (ref 80.0–100.0)
MONO ABS: 426 {cells}/uL (ref 200–950)
MONOS PCT: 6 %
MPV: 9.8 fL (ref 7.5–12.5)
Neutro Abs: 4686 cells/uL (ref 1500–7800)
Neutrophils Relative %: 66 %
Platelets: 219 10*3/uL (ref 140–400)
RBC: 4.88 MIL/uL (ref 4.20–5.80)
RDW: 13.7 % (ref 11.0–15.0)
WBC: 7.1 10*3/uL (ref 3.8–10.8)

## 2016-08-20 LAB — PSA: PSA: 2.9 ng/mL (ref <=4.0)

## 2016-08-20 MED ORDER — PANTOPRAZOLE SODIUM 40 MG PO TBEC: 40.0000 mg | DELAYED_RELEASE_TABLET | Freq: Every day | ORAL | 3 refills | Status: DC

## 2016-08-20 MED ORDER — PREDNISONE 20 MG PO TABS: ORAL_TABLET | ORAL | 0 refills | Status: DC

## 2016-08-20 MED ORDER — CIPROFLOXACIN HCL 500 MG PO TABS: 500.0000 mg | ORAL_TABLET | Freq: Two times a day (BID) | ORAL | 0 refills | Status: DC

## 2016-08-20 NOTE — Progress Notes (Signed)
Subjective:    Patient ID: David Bush, male    DOB: 1934/08/25, 82 y.o.   MRN: [id number]  HPI Was seen at the emergency room recently for severe right-sided lower back pain just above his gluteus. The pain was intense. It was exacerbated by changing positions. It hurts to roll over in the bed. It hurts to stand and walk. Patient went to emergency room. Urinalysis revealed 6-30 white blood cells per high-powered field and trace leukocyte esterase. This was not treated there. A CT scan was obtained which revealed diffuse degenerative disc disease particularly with retrolisthesis of L3 on L4. The patient does report pain radiating from his right lower back into his right hip. He denies any radiation of the pain below his gluteus. He denies any numbness or weakness in his legs. However the pain is not better and this was a week ago that the studies were done. He is here today also to establish care. He is due for a flu shot as well as the pneumonia vaccine. He is due for digital rectal exam as well as a PSA. He has a history of Barrett's esophagus. He is followed by GI, Dr. Wilfrid Lund.  He is on Nexium for Barrett's esophagus. His insurance will no longer cover Nexium but they will cover protonix. Past Medical History:  Diagnosis Date  . Barrett's esophagus   . Cancer ([place])   . Hyperlipidemia   . Hypertension   . Skin cancer    squamous cell of face   Past Surgical History:  Procedure Laterality Date  . CATARACT EXTRACTION, BILATERAL  2012  . KNEE ARTHROSCOPY Left 2010   torn meniscus  . SKIN CANCER EXCISION  2015, 2012  . UPPER GASTROINTESTINAL ENDOSCOPY     barrett's esophagus  . WISDOM TOOTH EXTRACTION  1980   Current Outpatient Prescriptions on File Prior to Visit  Medication Sig Dispense Refill  . acetaminophen (TYLENOL) 500 MG tablet Take 1,000 mg by mouth every 6 (six) hours as needed for moderate pain.    Marland Kitchen aspirin 81 MG tablet Take 81 mg by mouth at bedtime.     Marland Kitchen  atenolol (TENORMIN) 50 MG tablet Take 25 mg by mouth daily.     . benazepril (LOTENSIN) 10 MG tablet Take 10 mg by mouth daily.    . chlorthalidone (HYGROTON) 25 MG tablet Take 12.5 mg by mouth daily.    Marland Kitchen esomeprazole (NEXIUM) 40 MG capsule Take 40 mg by mouth at bedtime.     . Multiple Vitamins-Minerals (CENTRUM SILVER PO) Take 1 tablet by mouth daily.    . simvastatin (ZOCOR) 10 MG tablet Take 10 mg by mouth at bedtime.    . vitamin C (ASCORBIC ACID) 500 MG tablet Take 500 mg by mouth daily.     No current facility-administered medications on file prior to visit.    No Known Allergies Social History   Social History  . Marital status: Married    Spouse name: N/A  . Number of children: N/A  . Years of education: N/A   Occupational History  . Not on file.   Social History Main Topics  . Smoking status: Former Smoker    Quit date: 12/06/1966  . Smokeless tobacco: Never Used  . 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  . Drug use: No  . Sexual activity: Not on file   Other Topics Concern  . Not on file   Social History Narrative  .  No narrative on file   Family History  Problem Relation Age of Onset  . Esophageal cancer Mother   . Colon cancer Neg Hx       Review of Systems  All other systems reviewed and are negative.      Objective:   Physical Exam  Constitutional: He is oriented to person, place, and time. He appears well-developed and well-nourished. No distress.  HENT:  Head: Normocephalic and atraumatic.  Right Ear: External ear normal.  Left Ear: External ear normal.  Nose: Nose normal.  Mouth/Throat: Oropharynx is clear and moist. No oropharyngeal exudate.  Eyes: Conjunctivae and EOM are normal. Pupils are equal, round, and reactive to light. Right eye exhibits no discharge. Left eye exhibits no discharge. No scleral icterus.  Neck: Normal range of motion. Neck supple. No JVD present. No tracheal deviation present. No thyromegaly  present.  Cardiovascular: Normal rate, regular rhythm and intact distal pulses.   Murmur heard. Pulmonary/Chest: Effort normal and breath sounds normal. No stridor. No respiratory distress. He has no wheezes. He has no rales. He exhibits no tenderness.  Abdominal: Soft. Bowel sounds are normal. He exhibits no distension and no mass. There is no tenderness. There is no rebound and no guarding.  Genitourinary: Rectum normal, prostate normal and penis normal.  Musculoskeletal: He exhibits no edema or deformity.       Lumbar back: He exhibits decreased range of motion, tenderness, pain and spasm. He exhibits no bony tenderness.  Lymphadenopathy:    He has no cervical adenopathy.  Neurological: He is alert and oriented to person, place, and time. He has normal reflexes. He displays normal reflexes. No cranial nerve deficit. He exhibits normal muscle tone. Coordination normal.  Skin: Skin is warm. No rash noted. He is not diaphoretic. No erythema. No pallor.  Vitals reviewed.         Assessment & Plan:  DDD (degenerative disc disease), lumbar - Plan: predniSONE (DELTASONE) 20 MG tablet  Routine general medical examination at a health care facility - Plan: CBC with Differential/Platelet, COMPLETE METABOLIC PANEL WITH GFR, Lipid panel, PSA  Encounter to establish care with new doctor  Benign essential HTN  HLD (hyperlipidemia)  I reviewed the CT scan report from the emergency room along with urinalysis. I will treat the patient with Cipro 500 mg by mouth twice a day for 10 days for possible prostatitis given the fact he had 6-30 white blood cells per high-powered field in his urine sample and he also has trace leukocyte esterase on his urine sample. However I believe that the pain in his back was musculoskeletal and possibly referred from L3-L4 area therefore I will start the patient on a prednisone taper pack. Once he is off prednisone or like him to return for a flu shot and Prevnar 13.  Cancer screening is up-to-date. I will check a CBC, CMP, fasting lipid panel. Goal LDL cholesterol is less than 130. His blood pressure today is well goal. Regular anticipatory guidance is provided.  Switch Nexium to protonix for cost.

## 2016-08-21 LAB — COMPLETE METABOLIC PANEL WITH GFR
ALK PHOS: 52 U/L (ref 40–115)
ALT: 21 U/L (ref 9–46)
AST: 19 U/L (ref 10–35)
Albumin: 4.8 g/dL (ref 3.6–5.1)
BUN: 28 mg/dL — ABNORMAL HIGH (ref 7–25)
CALCIUM: 10.1 mg/dL (ref 8.6–10.3)
CO2: 27 mmol/L (ref 20–31)
Chloride: 100 mmol/L (ref 98–110)
Creat: 1.4 mg/dL — ABNORMAL HIGH (ref 0.70–1.11)
GFR, EST NON AFRICAN AMERICAN: 47 mL/min — AB (ref >=60)
GFR, Est African American: 54 mL/min — ABNORMAL LOW (ref >=60)
GLUCOSE: 106 mg/dL — AB (ref 70–99)
POTASSIUM: 4.2 mmol/L (ref 3.5–5.3)
Sodium: 137 mmol/L (ref 135–146)
TOTAL PROTEIN: 7.2 g/dL (ref 6.1–8.1)
Total Bilirubin: 0.4 mg/dL (ref 0.2–1.2)

## 2016-08-21 LAB — LIPID PANEL
CHOL/HDL RATIO: 3.9 ratio (ref <=5.0)
Cholesterol: 150 mg/dL (ref 125–200)
HDL: 38 mg/dL — AB (ref >=40)
LDL Cholesterol: 70 mg/dL (ref <130)
Triglycerides: 210 mg/dL — ABNORMAL HIGH (ref <150)
VLDL: 42 mg/dL — ABNORMAL HIGH (ref <30)

## 2016-08-23 ENCOUNTER — Encounter: Payer: Self-pay | Admitting: Family Medicine

## 2016-08-25 ENCOUNTER — Encounter: Payer: Self-pay | Admitting: Family Medicine

## 2016-08-31 ENCOUNTER — Ambulatory Visit (INDEPENDENT_AMBULATORY_CARE_PROVIDER_SITE_OTHER): Payer: Medicare Other

## 2016-08-31 DIAGNOSIS — Z23 Encounter for immunization: Secondary | ICD-10-CM | POA: Diagnosis not present

## 2016-09-01 ENCOUNTER — Other Ambulatory Visit: Payer: Self-pay | Admitting: Family Medicine

## 2016-09-01 MED ORDER — ATENOLOL 50 MG PO TABS: 25.0000 mg | ORAL_TABLET | Freq: Every day | ORAL | 3 refills | Status: DC

## 2016-09-01 MED ORDER — BENAZEPRIL HCL 10 MG PO TABS: 10.0000 mg | ORAL_TABLET | Freq: Every day | ORAL | 3 refills | Status: DC

## 2016-09-01 MED ORDER — PANTOPRAZOLE SODIUM 40 MG PO TBEC: 40.0000 mg | DELAYED_RELEASE_TABLET | Freq: Every day | ORAL | 3 refills | Status: DC

## 2016-09-01 MED ORDER — SIMVASTATIN 10 MG PO TABS: 10.0000 mg | ORAL_TABLET | Freq: Every day | ORAL | 3 refills | Status: DC

## 2016-09-01 MED ORDER — CHLORTHALIDONE 25 MG PO TABS
12.5000 mg | ORAL_TABLET | Freq: Every day | ORAL | 3 refills | Status: DC
Start: 2016-09-01 — End: 2017-10-11

## 2016-09-21 ENCOUNTER — Encounter: Payer: Self-pay | Admitting: Family Medicine

## 2016-09-21 DIAGNOSIS — E785 Hyperlipidemia, unspecified: Secondary | ICD-10-CM | POA: Insufficient documentation

## 2016-09-21 DIAGNOSIS — K2270 Barrett's esophagus without dysplasia: Secondary | ICD-10-CM | POA: Insufficient documentation

## 2016-09-21 DIAGNOSIS — I10 Essential (primary) hypertension: Secondary | ICD-10-CM | POA: Insufficient documentation

## 2016-09-21 DIAGNOSIS — H269 Unspecified cataract: Secondary | ICD-10-CM | POA: Insufficient documentation

## 2016-09-22 ENCOUNTER — Other Ambulatory Visit: Payer: Self-pay | Admitting: Family Medicine

## 2016-09-22 MED ORDER — ATENOLOL 50 MG PO TABS: 25.0000 mg | ORAL_TABLET | Freq: Every day | ORAL | 3 refills | Status: DC

## 2016-10-01 ENCOUNTER — Ambulatory Visit (INDEPENDENT_AMBULATORY_CARE_PROVIDER_SITE_OTHER): Payer: Medicare Other | Admitting: Family Medicine

## 2016-10-01 ENCOUNTER — Other Ambulatory Visit: Payer: Self-pay | Admitting: Family Medicine

## 2016-10-01 DIAGNOSIS — Z23 Encounter for immunization: Secondary | ICD-10-CM | POA: Diagnosis not present

## 2016-10-01 MED ORDER — ATENOLOL 50 MG PO TABS: 25.0000 mg | ORAL_TABLET | Freq: Every day | ORAL | 3 refills | Status: DC

## 2016-10-01 NOTE — Telephone Encounter (Signed)
Pt brought in refill forms from [facility].  Med escribed

## 2016-10-06 DIAGNOSIS — L821 Other seborrheic keratosis: Secondary | ICD-10-CM | POA: Diagnosis not present

## 2016-10-06 DIAGNOSIS — D485 Neoplasm of uncertain behavior of skin: Secondary | ICD-10-CM | POA: Diagnosis not present

## 2016-10-06 DIAGNOSIS — L570 Actinic keratosis: Secondary | ICD-10-CM | POA: Diagnosis not present

## 2016-10-06 DIAGNOSIS — L905 Scar conditions and fibrosis of skin: Secondary | ICD-10-CM | POA: Diagnosis not present

## 2016-10-06 DIAGNOSIS — Z85828 Personal history of other malignant neoplasm of skin: Secondary | ICD-10-CM | POA: Diagnosis not present

## 2016-10-06 DIAGNOSIS — Z23 Encounter for immunization: Secondary | ICD-10-CM | POA: Diagnosis not present

## 2016-11-03 DIAGNOSIS — H52223 Regular astigmatism, bilateral: Secondary | ICD-10-CM | POA: Diagnosis not present

## 2016-11-03 DIAGNOSIS — H35033 Hypertensive retinopathy, bilateral: Secondary | ICD-10-CM | POA: Diagnosis not present

## 2016-11-03 DIAGNOSIS — H5213 Myopia, bilateral: Secondary | ICD-10-CM | POA: Diagnosis not present

## 2016-11-03 DIAGNOSIS — H524 Presbyopia: Secondary | ICD-10-CM | POA: Diagnosis not present

## 2016-11-19 ENCOUNTER — Ambulatory Visit (INDEPENDENT_AMBULATORY_CARE_PROVIDER_SITE_OTHER): Payer: Medicare Other | Admitting: Family Medicine

## 2016-11-19 ENCOUNTER — Encounter: Payer: Self-pay | Admitting: Family Medicine

## 2016-11-19 VITALS — BP 110/70 | HR 62 | Temp 98.6°F | Resp 14 | Ht 73.0 in | Wt 206.0 lb

## 2016-11-19 DIAGNOSIS — J029 Acute pharyngitis, unspecified: Secondary | ICD-10-CM | POA: Diagnosis not present

## 2016-11-19 DIAGNOSIS — J069 Acute upper respiratory infection, unspecified: Secondary | ICD-10-CM

## 2016-11-19 DIAGNOSIS — B9789 Other viral agents as the cause of diseases classified elsewhere: Secondary | ICD-10-CM

## 2016-11-19 LAB — STREP GROUP A AG, W/REFLEX TO CULT: STREGTOCOCCUS GROUP A AG SCREEN: NOT DETECTED

## 2016-11-19 MED ORDER — GUAIFENESIN-CODEINE 100-10 MG/5ML PO SOLN: 5.0000 mL | Freq: Four times a day (QID) | ORAL | 0 refills | Status: DC | PRN

## 2016-11-19 NOTE — Patient Instructions (Addendum)
Take cough medicine,  Throat lozenges Call if not improved by next week  Upper Respiratory Infection, Adult Most upper respiratory infections (URIs) are a viral infection of the air passages leading to the lungs. A URI affects the nose, throat, and upper air passages. The most common type of URI is nasopharyngitis and is typically referred to as "the common cold." URIs run their course and usually go away on their own. Most of the time, a URI does not require medical attention, but sometimes a bacterial infection in the upper airways can follow a viral infection. This is called a secondary infection. Sinus and middle ear infections are common types of secondary upper respiratory infections. Bacterial pneumonia can also complicate a URI. A URI can worsen asthma and chronic obstructive pulmonary disease (COPD). Sometimes, these complications can require emergency medical care and may be life threatening. What are the causes? Almost all URIs are caused by viruses. A virus is a type of germ and can spread from one person to another. What increases the risk? You may be at risk for a URI if:  You smoke.  You have chronic heart or lung disease.  You have a weakened defense (immune) system.  You are very young or very old.  You have nasal allergies or asthma.  You work in crowded or poorly ventilated areas.  You work in health care facilities or schools. What are the signs or symptoms? Symptoms typically develop 2-3 days after you come in contact with a cold virus. Most viral URIs last 7-10 days. However, viral URIs from the influenza virus (flu virus) can last 14-18 days and are typically more severe. Symptoms may include:  Runny or stuffy (congested) nose.  Sneezing.  Cough.  Sore throat.  Headache.  Fatigue.  Fever.  Loss of appetite.  Pain in your forehead, behind your eyes, and over your cheekbones (sinus pain).  Muscle aches. How is this diagnosed? Your health care  provider may diagnose a URI by:  Physical exam.  Tests to check that your symptoms are not due to another condition such as:  Strep throat.  Sinusitis.  Pneumonia.  Asthma. How is this treated? A URI goes away on its own with time. It cannot be cured with medicines, but medicines may be prescribed or recommended to relieve symptoms. Medicines may help:  Reduce your fever.  Reduce your cough.  Relieve nasal congestion. Follow these instructions at home:  Take medicines only as directed by your health care provider.  Gargle warm saltwater or take cough drops to comfort your throat as directed by your health care provider.  Use a warm mist humidifier or inhale steam from a shower to increase air moisture. This may make it easier to breathe.  Drink enough fluid to keep your urine clear or pale yellow.  Eat soups and other clear broths and maintain good nutrition.  Rest as needed.  Return to work when your temperature has returned to normal or as your health care provider advises. You may need to stay home longer to avoid infecting others. You can also use a face mask and careful hand washing to prevent spread of the virus.  Increase the usage of your inhaler if you have asthma.  Do not use any tobacco products, including cigarettes, chewing tobacco, or electronic cigarettes. If you need help quitting, ask your health care provider. How is this prevented? The best way to protect yourself from getting a cold is to practice good hygiene.  Avoid oral or  hand contact with people with cold symptoms.  Wash your hands often if contact occurs. There is no clear evidence that vitamin C, vitamin E, echinacea, or exercise reduces the chance of developing a cold. However, it is always recommended to get plenty of rest, exercise, and practice good nutrition. Contact a health care provider if:  You are getting worse rather than better.  Your symptoms are not controlled by  medicine.  You have chills.  You have worsening shortness of breath.  You have brown or red mucus.  You have yellow or brown nasal discharge.  You have pain in your face, especially when you bend forward.  You have a fever.  You have swollen neck glands.  You have pain while swallowing.  You have white areas in the back of your throat. Get help right away if:  You have severe or persistent:  Headache.  Ear pain.  Sinus pain.  Chest pain.  You have chronic lung disease and any of the following:  Wheezing.  Prolonged cough.  Coughing up blood.  A change in your usual mucus.  You have a stiff neck.  You have changes in your:  Vision.  Hearing.  Thinking.  Mood. This information is not intended to replace advice given to you by your health care provider. Make sure you discuss any questions you have with your health care provider. Document Released: 05/18/2001 Document Revised: 07/25/2016 Document Reviewed: 02/27/2014 Elsevier Interactive Patient Education  2017 [employer].

## 2016-11-19 NOTE — Progress Notes (Signed)
   Subjective:    Patient ID: David Bush, male    DOB: 1934-01-15, 82 y.o.   MRN: [id number]  Patient presents for Illness (x3 days- sore throat, nonproductive cough)  Patient here with sore throat and nonproductive cough for the past 3 days. States he does not feel really bad. He has not had any fever no sinus pressure or drainage. He took Delsym over-the-counter and throat lozenge but this is not helping with this cough and it is keeping him up at night. No known sick contacts. He is a nonsmoker. No lung disease   Review Of Systems:  GEN- denies fatigue, fever, weight loss,weakness, recent illness HEENT- denies eye drainage, change in vision,+ nasal discharge, CVS- denies chest pain, palpitations RESP- denies SOB, +cough, wheeze ABD- denies N/V, change in stools, abd pain GU- denies dysuria, hematuria, dribbling, incontinence MSK- denies joint pain, muscle aches, injury Neuro- denies headache, dizziness, syncope, seizure activity       Objective:    BP 110/70 (BP Location: Left Arm, Patient Position: Sitting, Cuff Size: Large)   Pulse 62   Temp 98.6 F (37 C) (Oral)   Resp 14   Ht 6\' 1"  (1.854 m)   Wt 206 lb (93.4 kg)   SpO2 98%   BMI 27.18 kg/m  GEN- NAD, alert and oriented x3 HEENT- PERRL, EOMI, non injected sclera, pink conjunctiva, MMM, oropharynx mild injection, TM clear bilat no effusion,  no maxillary sinus tenderness, nares clear Neck- Supple, no LAD CVS- RRR, no murmur RESP-CTAB Pulses- Radial 2+  Dictation #1 [id number]  Strep Neg       Assessment & Plan:      Problem List Items Addressed This Visit    None    Visit Diagnoses    Viral URI with cough    -  Primary   Viral illness, treat symptoms, throat lozenge, robitussin codiene given, no red flags, if he worsens, fever, change in production call out zpak next week if needed   Relevant Orders   STREP GROUP A AG, W/REFLEX TO CULT (Completed)      Note: This dictation  was prepared with Dragon dictation along with smaller phrase technology. Any transcriptional errors that result from this process are unintentional.

## 2016-11-21 LAB — CULTURE, GROUP A STREP: Organism ID, Bacteria: NORMAL

## 2016-11-24 ENCOUNTER — Ambulatory Visit (INDEPENDENT_AMBULATORY_CARE_PROVIDER_SITE_OTHER): Payer: Medicare Other | Admitting: Family Medicine

## 2016-11-24 ENCOUNTER — Encounter: Payer: Self-pay | Admitting: Family Medicine

## 2016-11-24 VITALS — BP 112/72 | HR 80 | Temp 98.1°F | Resp 18 | Ht 73.0 in | Wt 204.0 lb

## 2016-11-24 DIAGNOSIS — J069 Acute upper respiratory infection, unspecified: Secondary | ICD-10-CM | POA: Diagnosis not present

## 2016-11-24 MED ORDER — AZITHROMYCIN 250 MG PO TABS: ORAL_TABLET | ORAL | 0 refills | Status: DC

## 2016-11-24 MED ORDER — METHYLPREDNISOLONE ACETATE 40 MG/ML IJ SUSP
40.0000 mg | Freq: Once | INTRAMUSCULAR | Status: AC
  Administered 2016-11-24: 40 mg via INTRAMUSCULAR

## 2016-11-24 NOTE — Patient Instructions (Signed)
Take antibiotics Prednisone shot given Take delsym for cough F/u as needed

## 2016-11-24 NOTE — Progress Notes (Signed)
   Subjective:    Patient ID: David Bush, male    DOB: Feb 23, 1934, 82 y.o.   MRN: [id number]  Patient presents for Illness (x1 week- cough, sore throat- seen on 12/15 with dx of viral URI)   Pt here with persistant cough, sore throat, seen on 12/15 I diagnosed him with viral URI and he was given robitussin with codiene.  Cough medicine did not help as much, continues to have productive cough, not improving, post nasal drip. No SOB, no wheezing,  Review Of Systems:  GEN- denies fatigue, fever, weight loss,weakness, recent illness HEENT- denies eye drainage, change in vision, +nasal discharge, CVS- denies chest pain, palpitations RESP- denies SOB, +cough, wheeze ABD- denies N/V, change in stools, abd pain GU- denies dysuria, hematuria, dribbling, incontinence MSK- denies joint pain, muscle aches, injury Neuro- denies headache, dizziness, syncope, seizure activity       Objective:    BP 112/72 (BP Location: Left Arm, Patient Position: Sitting, Cuff Size: Large)   Pulse 80   Temp 98.1 F (36.7 C) (Oral)   Resp 18   Ht 6\' 1"  (1.854 m)   Wt 204 lb (92.5 kg)   SpO2 98%   BMI 26.91 kg/m  GEN- NAD, alert and oriented x3 HEENT- PERRL, EOMI, non injected sclera, pink conjunctiva, MMM, oropharynx mild erythema, no exudate, nares clear rhimorrhea, TM clear  Neck- Supple, no LAD CVS- RRR, no murmur RESP-CTAB.irritated cough EXT- No edema Pulses- Radial  2+        Assessment & Plan:      Problem List Items Addressed This Visit    None    Visit Diagnoses    Acute URI    -  Primary   persistant symptoms, given depo medrol see if this helps with some of the bronchial irritation, zpak, delsym      Note: This dictation was prepared with Dragon dictation along with smaller phrase technology. Any transcriptional errors that result from this process are unintentional.

## 2017-04-13 DIAGNOSIS — Z85828 Personal history of other malignant neoplasm of skin: Secondary | ICD-10-CM | POA: Diagnosis not present

## 2017-04-13 DIAGNOSIS — D485 Neoplasm of uncertain behavior of skin: Secondary | ICD-10-CM | POA: Diagnosis not present

## 2017-04-13 DIAGNOSIS — L820 Inflamed seborrheic keratosis: Secondary | ICD-10-CM | POA: Diagnosis not present

## 2017-04-13 DIAGNOSIS — L570 Actinic keratosis: Secondary | ICD-10-CM | POA: Diagnosis not present

## 2017-04-13 DIAGNOSIS — L821 Other seborrheic keratosis: Secondary | ICD-10-CM | POA: Diagnosis not present

## 2017-08-24 ENCOUNTER — Other Ambulatory Visit: Payer: Self-pay | Admitting: Family Medicine

## 2017-08-24 DIAGNOSIS — Z0000 Encounter for general adult medical examination without abnormal findings: Secondary | ICD-10-CM

## 2017-08-24 DIAGNOSIS — Z79899 Other long term (current) drug therapy: Secondary | ICD-10-CM

## 2017-08-24 DIAGNOSIS — I10 Essential (primary) hypertension: Secondary | ICD-10-CM

## 2017-08-24 DIAGNOSIS — E785 Hyperlipidemia, unspecified: Secondary | ICD-10-CM

## 2017-08-24 DIAGNOSIS — Z125 Encounter for screening for malignant neoplasm of prostate: Secondary | ICD-10-CM

## 2017-08-25 ENCOUNTER — Ambulatory Visit (INDEPENDENT_AMBULATORY_CARE_PROVIDER_SITE_OTHER): Payer: Medicare Other | Admitting: *Deleted

## 2017-08-25 ENCOUNTER — Other Ambulatory Visit: Payer: Medicare Other

## 2017-08-25 DIAGNOSIS — Z125 Encounter for screening for malignant neoplasm of prostate: Secondary | ICD-10-CM

## 2017-08-25 DIAGNOSIS — I10 Essential (primary) hypertension: Secondary | ICD-10-CM

## 2017-08-25 DIAGNOSIS — Z0000 Encounter for general adult medical examination without abnormal findings: Secondary | ICD-10-CM | POA: Diagnosis not present

## 2017-08-25 DIAGNOSIS — Z79899 Other long term (current) drug therapy: Secondary | ICD-10-CM

## 2017-08-25 DIAGNOSIS — E785 Hyperlipidemia, unspecified: Secondary | ICD-10-CM | POA: Diagnosis not present

## 2017-08-25 DIAGNOSIS — Z23 Encounter for immunization: Secondary | ICD-10-CM

## 2017-08-25 LAB — COMPLETE METABOLIC PANEL WITH GFR
AG RATIO: 1.8 (calc) (ref 1.0–2.5)
ALT: 18 U/L (ref 9–46)
AST: 20 U/L (ref 10–35)
Albumin: 4.4 g/dL (ref 3.6–5.1)
Alkaline phosphatase (APISO): 57 U/L (ref 40–115)
BUN/Creatinine Ratio: 15 (calc) (ref 6–22)
BUN: 22 mg/dL (ref 7–25)
CHLORIDE: 100 mmol/L (ref 98–110)
CO2: 29 mmol/L (ref 20–32)
Calcium: 9.8 mg/dL (ref 8.6–10.3)
Creat: 1.5 mg/dL — ABNORMAL HIGH (ref 0.70–1.11)
GFR, EST AFRICAN AMERICAN: 50 mL/min/{1.73_m2} — AB (ref >=60)
GFR, EST NON AFRICAN AMERICAN: 43 mL/min/{1.73_m2} — AB (ref >=60)
GLOBULIN: 2.4 g/dL (ref 1.9–3.7)
Glucose, Bld: 114 mg/dL — ABNORMAL HIGH (ref 65–99)
Potassium: 4.6 mmol/L (ref 3.5–5.3)
Sodium: 137 mmol/L (ref 135–146)
Total Bilirubin: 0.6 mg/dL (ref 0.2–1.2)
Total Protein: 6.8 g/dL (ref 6.1–8.1)

## 2017-08-25 LAB — CBC WITH DIFFERENTIAL/PLATELET
BASOS PCT: 0.6 %
Basophils Absolute: 38 cells/uL (ref 0–200)
EOS PCT: 2.2 %
Eosinophils Absolute: 139 cells/uL (ref 15–500)
HCT: 42.5 % (ref 38.5–50.0)
HEMOGLOBIN: 14.4 g/dL (ref 13.2–17.1)
LYMPHS ABS: 1411 {cells}/uL (ref 850–3900)
MCH: 30.6 pg (ref 27.0–33.0)
MCHC: 33.9 g/dL (ref 32.0–36.0)
MCV: 90.2 fL (ref 80.0–100.0)
MONOS PCT: 7.5 %
MPV: 10 fL (ref 7.5–12.5)
NEUTROS ABS: 4240 {cells}/uL (ref 1500–7800)
Neutrophils Relative %: 67.3 %
Platelets: 189 10*3/uL (ref 140–400)
RBC: 4.71 10*6/uL (ref 4.20–5.80)
RDW: 12.5 % (ref 11.0–15.0)
Total Lymphocyte: 22.4 %
WBC mixed population: 473 cells/uL (ref 200–950)
WBC: 6.3 10*3/uL (ref 3.8–10.8)

## 2017-08-25 LAB — LIPID PANEL
CHOL/HDL RATIO: 4.1 (calc) (ref <5.0)
CHOLESTEROL: 153 mg/dL (ref <200)
HDL: 37 mg/dL — AB (ref >40)
LDL Cholesterol (Calc): 87 mg/dL (calc)
Non-HDL Cholesterol (Calc): 116 mg/dL (calc) (ref <130)
TRIGLYCERIDES: 196 mg/dL — AB (ref <150)

## 2017-08-25 LAB — PSA: PSA: 3.1 ng/mL (ref <=4.0)

## 2017-08-25 NOTE — Progress Notes (Signed)
Patient seen in office for Influenza Vaccination.   Tolerated IM administration well.   Immunization history updated.  

## 2017-08-29 ENCOUNTER — Ambulatory Visit (INDEPENDENT_AMBULATORY_CARE_PROVIDER_SITE_OTHER): Payer: Medicare Other | Admitting: Family Medicine

## 2017-08-29 ENCOUNTER — Encounter: Payer: Self-pay | Admitting: Family Medicine

## 2017-08-29 VITALS — BP 102/60 | HR 70 | Temp 98.3°F | Resp 16 | Ht 73.0 in | Wt 204.0 lb

## 2017-08-29 DIAGNOSIS — I10 Essential (primary) hypertension: Secondary | ICD-10-CM

## 2017-08-29 DIAGNOSIS — R7303 Prediabetes: Secondary | ICD-10-CM | POA: Diagnosis not present

## 2017-08-29 DIAGNOSIS — E7800 Pure hypercholesterolemia, unspecified: Secondary | ICD-10-CM

## 2017-08-29 DIAGNOSIS — Z0000 Encounter for general adult medical examination without abnormal findings: Secondary | ICD-10-CM | POA: Diagnosis not present

## 2017-08-29 DIAGNOSIS — K7689 Other specified diseases of liver: Secondary | ICD-10-CM | POA: Diagnosis not present

## 2017-08-29 MED ORDER — FLUTICASONE PROPIONATE 50 MCG/ACT NA SUSP: 2.0000 | Freq: Every day | NASAL | 6 refills | Status: DC

## 2017-08-29 NOTE — Progress Notes (Signed)
Subjective:    Patient ID: David Bush, male    DOB: 21-Jun-1934, 83 y.o.   MRN: [id number]  HPI Patient is here today for complete physical exam. He is due for a flu shot.  He is due for shingrix and Tdap.  We discussed both of these vaccines and he defers this at the present time. Patient declines a digital rectal exam. His PSA has trended up from 2.9-3.1. Given his advanced age, I recommended against further prostate cancer screening as long as the patient is asymptomatic.He has a history of Barrett's esophagus. He is followed by GI, Dr. Wilfrid Lund.  He is on Protonix for Barrett's esophagus.   He no longer requires a colonoscopy. He would like to repeat a picture of his liver. Last year, 7 mm cystlike lesion was seen on his liver coincidentally during an emergency room visit. He denies any symptoms or problems at the present time. I believe an ultrasound of the liver will be sufficient to rule out growth and would be a  cost-effective way of screening this area. His most recent lab work is listed below and shows the development of prediabetes as well as worsening renal function. He admits to having hypotensive episodes and orthostatic dizziness while playing golf this summer. I believe some of this is due to overmedication and dehydration. Appointment on 08/25/2017  Component Date Value Ref Range Status  . PSA 08/25/2017 3.1  < OR = 4.0 ng/mL Final   Comment: The total PSA value from this assay system is  standardized against the WHO standard. The test  result will be approximately 20% lower when compared  to the equimolar-standardized total PSA (Beckman  Coulter). Comparison of serial PSA results should be  interpreted with this fact in mind. . This test was performed using the Siemens  chemiluminescent method. Values obtained from  different assay methods cannot be used interchangeably. PSA levels, regardless of value, should not be interpreted as absolute evidence of the  presence or absence of disease.   . WBC 08/25/2017 6.3  3.8 - 10.8 Thousand/uL Final  . RBC 08/25/2017 4.71  4.20 - 5.80 Million/uL Final  . Hemoglobin 08/25/2017 14.4  13.2 - 17.1 g/dL Final  . HCT 08/25/2017 42.5  38.5 - 50.0 % Final  . MCV 08/25/2017 90.2  80.0 - 100.0 fL Final  . MCH 08/25/2017 30.6  27.0 - 33.0 pg Final  . MCHC 08/25/2017 33.9  32.0 - 36.0 g/dL Final  . RDW 08/25/2017 12.5  11.0 - 15.0 % Final  . Platelets 08/25/2017 189  140 - 400 Thousand/uL Final  . MPV 08/25/2017 10.0  7.5 - 12.5 fL Final  . Neutro Abs 08/25/2017 4240  1,500 - 7,800 cells/uL Final  . Lymphs Abs 08/25/2017 1411  850 - 3,900 cells/uL Final  . WBC mixed population 08/25/2017 473  200 - 950 cells/uL Final  . Eosinophils Absolute 08/25/2017 139  15 - 500 cells/uL Final  . Basophils Absolute 08/25/2017 38  0 - 200 cells/uL Final  . Neutrophils Relative % 08/25/2017 67.3  % Final  . Total Lymphocyte 08/25/2017 22.4  % Final  . Monocytes Relative 08/25/2017 7.5  % Final  . Eosinophils Relative 08/25/2017 2.2  % Final  . Basophils Relative 08/25/2017 0.6  % Final  . Cholesterol 08/25/2017 153  <200 mg/dL Final  . HDL 08/25/2017 37* >40 mg/dL Final  . Triglycerides 08/25/2017 196* <150 mg/dL Final  . LDL Cholesterol (Calc) 08/25/2017 87  mg/dL (calc) Final  Comment: Reference range: <100 . Desirable range <100 mg/dL for primary prevention;   <70 mg/dL for patients with CHD or diabetic patients  with > or = 2 CHD risk factors. Marland Kitchen LDL-C is now calculated using the Martin-Hopkins  calculation, which is a validated novel method providing  better accuracy than the Friedewald equation in the  estimation of LDL-C.  Cresenciano Genre et al. Annamaria Helling. 4193;790(24): 2061-2068  (http://education.[facility].com/faq/FAQ164)   . Total CHOL/HDL Ratio 08/25/2017 4.1  <5.0 (calc) Final  . Non-HDL Cholesterol (Calc) 08/25/2017 116  <130 mg/dL (calc) Final   Comment: For patients with diabetes plus 1 major ASCVD risk    factor, treating to a non-HDL-C goal of <100 mg/dL  (LDL-C of <70 mg/dL) is considered a therapeutic  option.   . Glucose, Bld 08/25/2017 114* 65 - 99 mg/dL Final   Comment: .            Fasting reference interval . For someone without known diabetes, a glucose value between 100 and 125 mg/dL is consistent with prediabetes and should be confirmed with a follow-up test. .   . BUN 08/25/2017 22  7 - 25 mg/dL Final  . Creat 08/25/2017 1.50* 0.70 - 1.11 mg/dL Final   Comment: For patients >59 years of age, the reference limit for Creatinine is approximately 13% higher for people identified as African-American. .   . GFR, Est Non African American 08/25/2017 43* > OR = 60 mL/min/1.59m2 Final  . GFR, Est African American 08/25/2017 50* > OR = 60 mL/min/1.71m2 Final  . BUN/Creatinine Ratio 08/25/2017 15  6 - 22 (calc) Final  . Sodium 08/25/2017 137  135 - 146 mmol/L Final  . Potassium 08/25/2017 4.6  3.5 - 5.3 mmol/L Final  . Chloride 08/25/2017 100  98 - 110 mmol/L Final  . CO2 08/25/2017 29  20 - 32 mmol/L Final  . Calcium 08/25/2017 9.8  8.6 - 10.3 mg/dL Final  . Total Protein 08/25/2017 6.8  6.1 - 8.1 g/dL Final  . Albumin 08/25/2017 4.4  3.6 - 5.1 g/dL Final  . Globulin 08/25/2017 2.4  1.9 - 3.7 g/dL (calc) Final  . AG Ratio 08/25/2017 1.8  1.0 - 2.5 (calc) Final  . Total Bilirubin 08/25/2017 0.6  0.2 - 1.2 mg/dL Final  . Alkaline phosphatase (APISO) 08/25/2017 57  40 - 115 U/L Final  . AST 08/25/2017 20  10 - 35 U/L Final  . ALT 08/25/2017 18  9 - 46 U/L Final    Past Medical History:  Diagnosis Date  . Barrett's esophagus   . Cancer ([place])   . CKD (chronic kidney disease) stage 3, GFR 30-59 ml/min   . Hyperlipidemia   . Hypertension   . Skin cancer    squamous cell of face   Past Surgical History:  Procedure Laterality Date  . CATARACT EXTRACTION, BILATERAL  2012  . KNEE ARTHROSCOPY Left 2010   torn meniscus  . SKIN CANCER EXCISION  2015, 2012  . UPPER  GASTROINTESTINAL ENDOSCOPY     barrett's esophagus  . WISDOM TOOTH EXTRACTION  1980   Current Outpatient Prescriptions on File Prior to Visit  Medication Sig Dispense Refill  . acetaminophen (TYLENOL) 500 MG tablet Take 1,000 mg by mouth every 6 (six) hours as needed for moderate pain.    Marland Kitchen aspirin 81 MG tablet Take 81 mg by mouth at bedtime.     Marland Kitchen atenolol (TENORMIN) 50 MG tablet Take 0.5 tablets (25 mg total) by mouth daily. 90 tablet  3  . benazepril (LOTENSIN) 10 MG tablet Take 1 tablet (10 mg total) by mouth daily. 90 tablet 3  . chlorthalidone (HYGROTON) 25 MG tablet Take 0.5 tablets (12.5 mg total) by mouth daily. 90 tablet 3  . Multiple Vitamins-Minerals (CENTRUM SILVER PO) Take 1 tablet by mouth daily.    . pantoprazole (PROTONIX) 40 MG tablet Take 1 tablet (40 mg total) by mouth daily. 90 tablet 3  . simvastatin (ZOCOR) 10 MG tablet Take 1 tablet (10 mg total) by mouth at bedtime. 90 tablet 3   No current facility-administered medications on file prior to visit.    No Known Allergies Social History   Social History  . Marital status: Married    Spouse name: N/A  . Number of children: N/A  . Years of education: N/A   Occupational History  . Not on file.   Social History Main Topics  . Smoking status: Former Smoker    Quit date: 12/06/1966  . Smokeless tobacco: Never Used  . 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  . Drug use: No  . Sexual activity: Not on file   Other Topics Concern  . Not on file   Social History Narrative  . No narrative on file   Family History  Problem Relation Age of Onset  . Esophageal cancer Mother   . Colon cancer Neg Hx       Review of Systems  All other systems reviewed and are negative.      Objective:   Physical Exam  Constitutional: He is oriented to person, place, and time. He appears well-developed and well-nourished. No distress.  HENT:  Head: Normocephalic and atraumatic.  Right Ear: External  ear normal.  Left Ear: External ear normal.  Nose: Nose normal.  Mouth/Throat: Oropharynx is clear and moist. No oropharyngeal exudate.  Eyes: Pupils are equal, round, and reactive to light. Conjunctivae and EOM are normal. Right eye exhibits no discharge. Left eye exhibits no discharge. No scleral icterus.  Neck: Normal range of motion. Neck supple. No JVD present. No tracheal deviation present. No thyromegaly present.  Cardiovascular: Normal rate, regular rhythm and intact distal pulses.   Murmur heard. Pulmonary/Chest: Effort normal and breath sounds normal. No stridor. No respiratory distress. He has no wheezes. He has no rales. He exhibits no tenderness.  Abdominal: Soft. Bowel sounds are normal. He exhibits no distension and no mass. There is no tenderness. There is no rebound and no guarding.  Genitourinary: Rectum normal, prostate normal and penis normal.  Musculoskeletal: He exhibits no edema or deformity.       Lumbar back: He exhibits decreased range of motion, tenderness, pain and spasm. He exhibits no bony tenderness.  Lymphadenopathy:    He has no cervical adenopathy.  Neurological: He is alert and oriented to person, place, and time. He has normal reflexes. No cranial nerve deficit. He exhibits normal muscle tone. Coordination normal.  Skin: Skin is warm. No rash noted. He is not diaphoretic. No erythema. No pallor.  Vitals reviewed.         Assessment & Plan:  Routine general medical examination at a health care facility  Benign essential HTN  Pure hypercholesterolemia  Prediabetes  Patient received his flu shot. He declines the shingles vaccine and the tetanus shot. Colonoscopy is up-to-date. PSA is within normal limits and shows excessive growth. I will schedule the patient for right upper quadrant ultrasound to look at the liver. Cholesterol is acceptable.  We did discuss a low carbohydrate diet to prevent worsening prediabetes. I will recheck this in 6 months.  Blood pressure is low. Given the rise in his creatinine, I recommended discontinuation of chlorthalidone.

## 2017-09-08 ENCOUNTER — Ambulatory Visit (HOSPITAL_COMMUNITY): Payer: Medicare Other

## 2017-09-09 ENCOUNTER — Ambulatory Visit (HOSPITAL_COMMUNITY)
Admission: RE | Admit: 2017-09-09 | Discharge: 2017-09-09 | Disposition: A | Discharge status: Home / Self Care | Payer: Medicare Other | Source: Ambulatory Visit | Attending: Family Medicine | Admitting: Family Medicine

## 2017-09-09 DIAGNOSIS — K7689 Other specified diseases of liver: Secondary | ICD-10-CM

## 2017-09-09 DIAGNOSIS — K760 Fatty (change of) liver, not elsewhere classified: Secondary | ICD-10-CM | POA: Insufficient documentation

## 2017-10-11 ENCOUNTER — Encounter: Payer: Self-pay | Admitting: Family Medicine

## 2017-10-11 ENCOUNTER — Ambulatory Visit (INDEPENDENT_AMBULATORY_CARE_PROVIDER_SITE_OTHER): Payer: Medicare Other | Admitting: Family Medicine

## 2017-10-11 VITALS — BP 170/70 | HR 46 | Temp 97.8°F | Resp 16 | Ht 73.0 in | Wt 206.0 lb

## 2017-10-11 DIAGNOSIS — I10 Essential (primary) hypertension: Secondary | ICD-10-CM | POA: Diagnosis not present

## 2017-10-11 DIAGNOSIS — N183 Chronic kidney disease, stage 3 unspecified: Secondary | ICD-10-CM

## 2017-10-11 MED ORDER — BENAZEPRIL HCL 40 MG PO TABS: 40.0000 mg | ORAL_TABLET | Freq: Every day | ORAL | 3 refills | Status: DC

## 2017-10-11 NOTE — Progress Notes (Signed)
Subjective:    Patient ID: David Bush, male    DOB: [birth year], 83 y.o.   MRN: [id number]  HPI  At the patient's physical exam in September, I discontinued hydrochlorothiazide due to declining renal function and chronic kidney disease to avoid worsening the underlying problem.  However since making these changes, his blood pressure has steadily increased and his average systolic blood pressure ranges between 150 and 160/80-90.  He is currently on benazepril 10 mg a day in addition to metoprolol.  He also has relative bradycardia although he is asymptomatic Past Medical History:  Diagnosis Date  . Barrett's esophagus   . Cancer ([place])   . CKD (chronic kidney disease) stage 3, GFR 30-59 ml/min (HCC)   . Hyperlipidemia   . Hypertension   . Skin cancer    squamous cell of face   Past Surgical History:  Procedure Laterality Date  . CATARACT EXTRACTION, BILATERAL  2012  . KNEE ARTHROSCOPY Left 2010   torn meniscus  . SKIN CANCER EXCISION  2015, 2012  . UPPER GASTROINTESTINAL ENDOSCOPY     barrett's esophagus  . WISDOM TOOTH EXTRACTION  1980   Current Outpatient Medications on File Prior to Visit  Medication Sig Dispense Refill  . acetaminophen (TYLENOL) 500 MG tablet Take 1,000 mg by mouth every 6 (six) hours as needed for moderate pain.    Marland Kitchen aspirin 81 MG tablet Take 81 mg by mouth at bedtime.     Marland Kitchen atenolol (TENORMIN) 50 MG tablet Take 0.5 tablets (25 mg total) by mouth daily. 90 tablet 3  . benazepril (LOTENSIN) 10 MG tablet Take 1 tablet (10 mg total) by mouth daily. 90 tablet 3  . fluticasone (FLONASE) 50 MCG/ACT nasal spray Place 2 sprays into both nostrils daily. 16 g 6  . Multiple Vitamins-Minerals (CENTRUM SILVER PO) Take 1 tablet by mouth daily.    . pantoprazole (PROTONIX) 40 MG tablet Take 1 tablet (40 mg total) by mouth daily. 90 tablet 3  . simvastatin (ZOCOR) 10 MG tablet Take 1 tablet (10 mg total) by mouth at bedtime. 90 tablet 3   No current  facility-administered medications on file prior to visit.    No Known Allergies Social History   Socioeconomic History  . Marital status: Married    Spouse name: Not on file  . Number of children: Not on file  . Years of education: Not on file  . Highest education level: Not on file  Social Needs  . Financial resource strain: Not on file  . Food insecurity - worry: Not on file  . Food insecurity - inability: Not on file  . Transportation needs - medical: Not on file  . Transportation needs - non-medical: Not on file  Occupational History  . Not on file  Tobacco Use  . Smoking status: Former Smoker    Last attempt to quit: 12/06/1966    Years since quitting: 50.8  . Smokeless tobacco: Never Used  Substance and Sexual Activity  . 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  . Drug use: No  . Sexual activity: Not on file  Other Topics Concern  . Not on file  Social History Narrative  . Not on file     Review of Systems  All other systems reviewed and are negative.      Objective:   Physical Exam  Constitutional: He appears well-developed and well-nourished.  Neck: No JVD present.  Cardiovascular: Normal rate, regular rhythm and normal heart sounds.  Pulmonary/Chest: Effort normal and breath sounds normal. No respiratory distress. He has no wheezes. He has no rales.  Abdominal: Soft. Bowel sounds are normal.  Musculoskeletal: He exhibits no edema.  Vitals reviewed.         Assessment & Plan:  Benign essential HTN  CRI (chronic renal insufficiency), stage 3 (moderate) (HCC)  Patient's blood pressure is elevated today.  I have recommended increasing benazepril from 10 mg a day to 40 mg a day and monitoring renal function and blood pressure more closely.  I explained to the patient that the higher dose ACE inhibitor would be renal protective.  However also explained that it could cause his creatinine to rise as much as  30%.  We will need to watch this closely along with his blood pressure.  If his renal function declines, we may need to discontinue the benazepril or at least reduce the dose and replace it with Cardura

## 2017-10-13 ENCOUNTER — Other Ambulatory Visit: Payer: Self-pay | Admitting: Family Medicine

## 2017-10-21 DIAGNOSIS — L821 Other seborrheic keratosis: Secondary | ICD-10-CM | POA: Diagnosis not present

## 2017-10-21 DIAGNOSIS — Z23 Encounter for immunization: Secondary | ICD-10-CM | POA: Diagnosis not present

## 2017-10-21 DIAGNOSIS — L570 Actinic keratosis: Secondary | ICD-10-CM | POA: Diagnosis not present

## 2017-10-21 DIAGNOSIS — Z85828 Personal history of other malignant neoplasm of skin: Secondary | ICD-10-CM | POA: Diagnosis not present

## 2017-10-21 DIAGNOSIS — D485 Neoplasm of uncertain behavior of skin: Secondary | ICD-10-CM | POA: Diagnosis not present

## 2017-10-24 ENCOUNTER — Telehealth: Payer: Self-pay | Admitting: Family Medicine

## 2017-10-24 NOTE — Telephone Encounter (Signed)
Dr Dennard Schaumann prescribed patient increased dose of his bp med, it is not working, would like a call back regarding this  [phone]

## 2017-10-24 NOTE — Telephone Encounter (Signed)
Benazepril increased from 10mg  to 40mg  at last OV.   Call placed to patient to inquire. [place].

## 2017-10-26 ENCOUNTER — Ambulatory Visit (INDEPENDENT_AMBULATORY_CARE_PROVIDER_SITE_OTHER): Payer: Medicare Other | Admitting: Family Medicine

## 2017-10-26 ENCOUNTER — Encounter: Payer: Self-pay | Admitting: Family Medicine

## 2017-10-26 VITALS — BP 160/70 | HR 58 | Temp 97.6°F | Resp 18 | Ht 73.0 in | Wt 207.0 lb

## 2017-10-26 DIAGNOSIS — I10 Essential (primary) hypertension: Secondary | ICD-10-CM | POA: Diagnosis not present

## 2017-10-26 DIAGNOSIS — J069 Acute upper respiratory infection, unspecified: Secondary | ICD-10-CM

## 2017-10-26 MED ORDER — DOXAZOSIN MESYLATE 4 MG PO TABS: 4.0000 mg | ORAL_TABLET | Freq: Every day | ORAL | 3 refills | Status: DC

## 2017-10-26 MED ORDER — HYDROCODONE-HOMATROPINE 5-1.5 MG/5ML PO SYRP
5.0000 mL | ORAL_SOLUTION | Freq: Three times a day (TID) | ORAL | 0 refills | Status: DC | PRN
Start: 2017-10-26 — End: 2017-12-26

## 2017-10-26 MED ORDER — AZITHROMYCIN 250 MG PO TABS: ORAL_TABLET | ORAL | 0 refills | Status: DC

## 2017-10-26 NOTE — Telephone Encounter (Signed)
Pt seen in ov today  

## 2017-10-26 NOTE — Progress Notes (Signed)
Subjective:    Patient ID: David Bush, male    DOB: 10/31/1934, 83 y.o.   MRN: [id number]  HPI 10/11/17 At the patient's physical exam in September, I discontinued hydrochlorothiazide due to declining renal function and chronic kidney disease to avoid worsening the underlying problem.  However since making these changes, his blood pressure has steadily increased and his average systolic blood pressure ranges between 150 and 160/80-90.  He is currently on benazepril 10 mg a day in addition to metoprolol.  He also has relative bradycardia although he is asymptomatic.  At that time, my plan was: Patient's blood pressure is elevated today.  I have recommended increasing benazepril from 10 mg a day to 40 mg a day and monitoring renal function and blood pressure more closely.  I explained to the patient that the higher dose ACE inhibitor would be renal protective.  However also explained that it could cause his creatinine to rise as much as 30%.  We will need to watch this closely along with his blood pressure.  If his renal function declines, we may need to discontinue the benazepril or at least reduce the dose and replace it with Cardura  10/26/17 Blood pressure remains significantly elevated despite being on benazepril 40 mg a day.  He reports similar values at home.  He denies any chest pain.  Over the last 2 days he has developed a head cold.  He reports pain and pressure in his frontal sinuses.  He reports rhinorrhea and postnasal drip.  He reports sore scratchy throat and a nonproductive cough that is worse at night when he lies down.  He denies any shortness of breath or purulent sputum Past Medical History:  Diagnosis Date  . Barrett's esophagus   . Cancer ([place])   . CKD (chronic kidney disease) stage 3, GFR 30-59 ml/min (HCC)   . Hyperlipidemia   . Hypertension   . Skin cancer    squamous cell of face   Past Surgical History:  Procedure Laterality Date  . CATARACT EXTRACTION,  BILATERAL  2012  . KNEE ARTHROSCOPY Left 2010   torn meniscus  . SKIN CANCER EXCISION  2015, 2012  . UPPER GASTROINTESTINAL ENDOSCOPY     barrett's esophagus  . WISDOM TOOTH EXTRACTION  1980   Current Outpatient Medications on File Prior to Visit  Medication Sig Dispense Refill  . acetaminophen (TYLENOL) 500 MG tablet Take 1,000 mg by mouth every 6 (six) hours as needed for moderate pain.    Marland Kitchen aspirin 81 MG tablet Take 81 mg by mouth at bedtime.     Marland Kitchen atenolol (TENORMIN) 50 MG tablet Take 0.5 tablets (25 mg total) by mouth daily. 90 tablet 3  . benazepril (LOTENSIN) 40 MG tablet Take 1 tablet (40 mg total) daily by mouth. 90 tablet 3  . fluticasone (FLONASE) 50 MCG/ACT nasal spray Place 2 sprays into both nostrils daily. 16 g 6  . Multiple Vitamins-Minerals (CENTRUM SILVER PO) Take 1 tablet by mouth daily.    . pantoprazole (PROTONIX) 40 MG tablet TAKE 1 TABLET DAILY 90 tablet 3  . simvastatin (ZOCOR) 10 MG tablet TAKE 1 TABLET AT BEDTIME 90 tablet 3   No current facility-administered medications on file prior to visit.    No Known Allergies Social History   Socioeconomic History  . Marital status: Married    Spouse name: Not on file  . Number of children: Not on file  . Years of education: Not on file  . Highest  education level: Not on file  Social Needs  . Financial resource strain: Not on file  . Food insecurity - worry: Not on file  . Food insecurity - inability: Not on file  . Transportation needs - medical: Not on file  . Transportation needs - non-medical: Not on file  Occupational History  . Not on file  Tobacco Use  . Smoking status: Former Smoker    Last attempt to quit: 12/06/1966    Years since quitting: 50.9  . Smokeless tobacco: Never Used  Substance and Sexual Activity  . 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  . Drug use: No  . Sexual activity: Not on file  Other Topics Concern  . Not on file  Social  History Narrative  . Not on file     Review of Systems  All other systems reviewed and are negative.      Objective:   Physical Exam  Constitutional: He appears well-developed and well-nourished. No distress.  HENT:  Right Ear: External ear normal.  Left Ear: External ear normal.  Nose: Nose normal.  Mouth/Throat: Oropharynx is clear and moist. No oropharyngeal exudate.  Eyes: Conjunctivae are normal.  Neck: Neck supple. No JVD present.  Cardiovascular: Normal rate, regular rhythm and normal heart sounds. Exam reveals no gallop and no friction rub.  No murmur heard. Pulmonary/Chest: Effort normal and breath sounds normal. No respiratory distress. He has no wheezes. He has no rales.  Abdominal: Soft. Bowel sounds are normal. He exhibits no distension. There is no tenderness.  Musculoskeletal: He exhibits no edema.  Lymphadenopathy:    He has no cervical adenopathy.  Skin: He is not diaphoretic.  Vitals reviewed.         Assessment & Plan:  Benign essential HTN  Viral upper respiratory tract infection  Patient's blood pressure is too high.  Continue lisinopril 40 mg a day but add Cardura 4 mg a day and recheck blood pressure and BMP in 1 month.  Monitor for orthostatic dizziness.  Patient also appears to be developing a viral upper respiratory infection.  I explained to the patient that this must resolve on its own.  Antibiotics will not help.  He can take Mucinex DM for cough and chest congestion.  Allow 1 week to run its course.  I did give him Hycodan 1 teaspoon every 8 hours as needed for severe cough particularly at night when he cannot sleep.  Because of the holiday weekend, I did give him a Z-Pak with strict instructions not to fill unless he develops a fever greater than 101, worsening cough, increasing chest congestion and/or purulent sputum.

## 2017-11-16 DIAGNOSIS — Z961 Presence of intraocular lens: Secondary | ICD-10-CM | POA: Diagnosis not present

## 2017-11-16 DIAGNOSIS — H35033 Hypertensive retinopathy, bilateral: Secondary | ICD-10-CM | POA: Diagnosis not present

## 2017-11-16 DIAGNOSIS — H26493 Other secondary cataract, bilateral: Secondary | ICD-10-CM | POA: Diagnosis not present

## 2017-12-24 ENCOUNTER — Other Ambulatory Visit: Payer: Self-pay | Admitting: Family Medicine

## 2017-12-26 ENCOUNTER — Encounter: Payer: Self-pay | Admitting: Family Medicine

## 2017-12-26 ENCOUNTER — Ambulatory Visit (INDEPENDENT_AMBULATORY_CARE_PROVIDER_SITE_OTHER): Payer: Medicare Other | Admitting: Family Medicine

## 2017-12-26 VITALS — BP 162/70 | HR 48 | Temp 97.6°F | Resp 16 | Ht 73.0 in | Wt 211.0 lb

## 2017-12-26 DIAGNOSIS — I10 Essential (primary) hypertension: Secondary | ICD-10-CM | POA: Diagnosis not present

## 2017-12-26 NOTE — Progress Notes (Signed)
Subjective:    Patient ID: David Bush, male    DOB: 1934/09/16, 84 y.o.   MRN: [id number]  Hypertension    10/11/17 At the patient's physical exam in September, I discontinued hydrochlorothiazide due to declining renal function and chronic kidney disease to avoid worsening the underlying problem.  However since making these changes, his blood pressure has steadily increased and his average systolic blood pressure ranges between 150 and 160/80-90.  He is currently on benazepril 10 mg a day in addition to metoprolol.  He also has relative bradycardia although he is asymptomatic.  At that time, my plan was: Patient's blood pressure is elevated today.  I have recommended increasing benazepril from 10 mg a day to 40 mg a day and monitoring renal function and blood pressure more closely.  I explained to the patient that the higher dose ACE inhibitor would be renal protective.  However also explained that it could cause his creatinine to rise as much as 30%.  We will need to watch this closely along with his blood pressure.  If his renal function declines, we may need to discontinue the benazepril or at least reduce the dose and replace it with Cardura  10/26/17 Blood pressure remains significantly elevated despite being on benazepril 40 mg a day.  He reports similar values at home.  He denies any chest pain.  Over the last 2 days he has developed a head cold.  He reports pain and pressure in his frontal sinuses.  He reports rhinorrhea and postnasal drip.  He reports sore scratchy throat and a nonproductive cough that is worse at night when he lies down.  He denies any shortness of breath or purulent sputum.  At that time, my plan was: Patient's blood pressure is too high.  Continue lisinopril 40 mg a day but add Cardura 4 mg a day and recheck blood pressure and BMP in 1 month.  Monitor for orthostatic dizziness.  Patient also appears to be developing a viral upper respiratory infection.  I explained  to the patient that this must resolve on its own.  Antibiotics will not help.  He can take Mucinex DM for cough and chest congestion.  Allow 1 week to run its course.  I did give him Hycodan 1 teaspoon every 8 hours as needed for severe cough particularly at night when he cannot sleep.  Because of the holiday weekend, I did give him a Z-Pak with strict instructions not to fill unless he develops a fever greater than 101, worsening cough, increasing chest congestion and/or purulent sputum.  12/26/17 Patient's blood pressure has been averaging 140-160/80. His urine output has improved since beginning Cardura. His average blood pressures 150. However his bradycardia is getting worse. Furthermore he had an episode of palpitations 2 weeks ago while trying to sleep where his heart rate was erratic for several minutes. His any chest pain shortness of breath dyspnea on exertion or strokelike symptoms Past Medical History:  Diagnosis Date  . Barrett's esophagus   . Cancer ([place])   . CKD (chronic kidney disease) stage 3, GFR 30-59 ml/min (HCC)   . Hyperlipidemia   . Hypertension   . Skin cancer    squamous cell of face   Past Surgical History:  Procedure Laterality Date  . CATARACT EXTRACTION, BILATERAL  2012  . KNEE ARTHROSCOPY Left 2010   torn meniscus  . SKIN CANCER EXCISION  2015, 2012  . UPPER GASTROINTESTINAL ENDOSCOPY     barrett's esophagus  .  WISDOM TOOTH EXTRACTION  1980   Current Outpatient Medications on File Prior to Visit  Medication Sig Dispense Refill  . acetaminophen (TYLENOL) 500 MG tablet Take 1,000 mg by mouth every 6 (six) hours as needed for moderate pain.    Marland Kitchen aspirin 81 MG tablet Take 81 mg by mouth at bedtime.     Marland Kitchen atenolol (TENORMIN) 50 MG tablet TAKE ONE-HALF (1/2) TABLET DAILY 90 tablet 3  . benazepril (LOTENSIN) 40 MG tablet Take 1 tablet (40 mg total) daily by mouth. 90 tablet 3  . doxazosin (CARDURA) 4 MG tablet Take 1 tablet (4 mg total) by mouth daily. 30 tablet 3    . fluticasone (FLONASE) 50 MCG/ACT nasal spray Place 2 sprays into both nostrils daily. 16 g 6  . Multiple Vitamins-Minerals (CENTRUM SILVER PO) Take 1 tablet by mouth daily.    . pantoprazole (PROTONIX) 40 MG tablet TAKE 1 TABLET DAILY 90 tablet 3  . simvastatin (ZOCOR) 10 MG tablet TAKE 1 TABLET AT BEDTIME 90 tablet 3   No current facility-administered medications on file prior to visit.    No Known Allergies Social History   Socioeconomic History  . Marital status: Married    Spouse name: Not on file  . Number of children: Not on file  . Years of education: Not on file  . Highest education level: Not on file  Social Needs  . Financial resource strain: Not on file  . Food insecurity - worry: Not on file  . Food insecurity - inability: Not on file  . Transportation needs - medical: Not on file  . Transportation needs - non-medical: Not on file  Occupational History  . Not on file  Tobacco Use  . Smoking status: Former Smoker    Last attempt to quit: 12/06/1966    Years since quitting: 51.0  . Smokeless tobacco: Never Used  Substance and Sexual Activity  . 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  . Drug use: No  . Sexual activity: Not on file  Other Topics Concern  . Not on file  Social History Narrative  . Not on file     Review of Systems  All other systems reviewed and are negative.      Objective:   Physical Exam  Constitutional: He appears well-developed and well-nourished. No distress.  HENT:  Right Ear: External ear normal.  Left Ear: External ear normal.  Nose: Nose normal.  Mouth/Throat: Oropharynx is clear and moist. No oropharyngeal exudate.  Eyes: Conjunctivae are normal.  Neck: Neck supple. No JVD present.  Cardiovascular: Normal rate, regular rhythm and normal heart sounds. Exam reveals no gallop and no friction rub.  No murmur heard. Pulmonary/Chest: Effort normal and breath sounds normal. No  respiratory distress. He has no wheezes. He has no rales.  Abdominal: Soft. Bowel sounds are normal. He exhibits no distension. There is no tenderness.  Musculoskeletal: He exhibits no edema.  Lymphadenopathy:    He has no cervical adenopathy.  Skin: He is not diaphoretic.  Vitals reviewed.         Assessment & Plan:  Benign essential HTN  I will schedule the patient for Holter monitor to evaluate the cause of his palpitations. I suspect PVCs coupled with bradycardia. Discontinue atenolol and switch to a more selective beta blocker Bystolic 10 mg a day and recheck blood pressure in one month. If blood pressure remains elevated, patient would like just to  resume hydrochlorothiazide at his previous dose since this worked well for him

## 2017-12-28 ENCOUNTER — Telehealth: Payer: Self-pay | Admitting: Family Medicine

## 2017-12-28 NOTE — Telephone Encounter (Signed)
-----   Message from [name], MD sent at 12/26/2017  1:45 PM EST ----- Needs holter monitor

## 2017-12-28 NOTE — Telephone Encounter (Signed)
[facility] called and Holter monitor ordered.

## 2018-01-04 DIAGNOSIS — L570 Actinic keratosis: Secondary | ICD-10-CM | POA: Diagnosis not present

## 2018-01-04 NOTE — Telephone Encounter (Signed)
Received monitor and pt called and apt made.

## 2018-01-05 ENCOUNTER — Ambulatory Visit (INDEPENDENT_AMBULATORY_CARE_PROVIDER_SITE_OTHER): Payer: Medicare Other | Admitting: *Deleted

## 2018-01-05 ENCOUNTER — Other Ambulatory Visit: Payer: Self-pay

## 2018-01-05 DIAGNOSIS — R002 Palpitations: Secondary | ICD-10-CM

## 2018-01-05 NOTE — Progress Notes (Signed)
Patient seen in office to have holter monitor placed.   Educated on monitor and event summary.   Patient to return in 24 hours to have monitor removed.   Verbalized understanding.

## 2018-01-05 NOTE — Patient Instructions (Signed)
Return in 24 hours to have holter monitor removed.

## 2018-01-06 ENCOUNTER — Ambulatory Visit: Payer: Medicare Other

## 2018-01-06 DIAGNOSIS — R002 Palpitations: Secondary | ICD-10-CM

## 2018-01-06 NOTE — Progress Notes (Signed)
Patient was in office for Holter monitor removal

## 2018-01-16 ENCOUNTER — Other Ambulatory Visit: Payer: Self-pay | Admitting: Family Medicine

## 2018-01-16 ENCOUNTER — Telehealth: Payer: Self-pay | Admitting: Family Medicine

## 2018-01-16 DIAGNOSIS — I472 Ventricular tachycardia, unspecified: Secondary | ICD-10-CM

## 2018-01-16 NOTE — Telephone Encounter (Signed)
Holter monitor shows isolated afib w/ rvr vs. V tach - per Dr. Dennard Schaumann need to see cardiologist. Pt called and requested Dr. Allegra Lai. Note in referral for specific MD and pt aware.

## 2018-01-17 ENCOUNTER — Encounter: Payer: Self-pay | Admitting: Cardiology

## 2018-01-17 ENCOUNTER — Ambulatory Visit (INDEPENDENT_AMBULATORY_CARE_PROVIDER_SITE_OTHER): Payer: Medicare Other | Admitting: Cardiology

## 2018-01-17 ENCOUNTER — Other Ambulatory Visit: Payer: Self-pay

## 2018-01-17 VITALS — BP 138/78 | HR 82 | Ht 73.0 in | Wt 209.2 lb

## 2018-01-17 DIAGNOSIS — R002 Palpitations: Secondary | ICD-10-CM

## 2018-01-17 DIAGNOSIS — I480 Paroxysmal atrial fibrillation: Secondary | ICD-10-CM

## 2018-01-17 DIAGNOSIS — I10 Essential (primary) hypertension: Secondary | ICD-10-CM

## 2018-01-17 DIAGNOSIS — E785 Hyperlipidemia, unspecified: Secondary | ICD-10-CM

## 2018-01-17 LAB — TSH: TSH: 3.5 u[IU]/mL (ref 0.450–4.500)

## 2018-01-17 MED ORDER — DRONEDARONE HCL 400 MG PO TABS: 400.0000 mg | ORAL_TABLET | Freq: Two times a day (BID) | ORAL | 3 refills | Status: DC

## 2018-01-17 NOTE — Addendum Note (Signed)
Addended by: Stanton Kidney on: 01/17/2018 05:18 PM   Modules accepted: Orders

## 2018-01-17 NOTE — Patient Instructions (Addendum)
Medication Instructions:  Your physician has recommended you make the following change in your medication:  1. START Multaq 400 mg twice daily  * If you need a refill on your cardiac medications before your next appointment, please call your pharmacy. *  Labwork: Today: TSH  Testing/Procedures: Your physician has requested that you have an echocardiogram. Echocardiography is a painless test that uses sound waves to create images of your heart. It provides your doctor with information about the size and shape of your heart and how well your heart's chambers and valves are working. This procedure takes approximately one hour. There are no restrictions for this procedure.  Follow-Up: Your physician recommends that you schedule a follow-up appointment in: 3 months with Dr. Curt Bears.  Thank you for choosing [facility]!!   Trinidad Curet, RN [phone]  Any Other Special Instructions Will Be Listed Below (If Applicable).  Dronedarone tablets What is this medicine? DRONEDARONE (droe NE da rone) is an antiarrhythmic drug. It helps make your heart beat regularly. This medicine may be used for other purposes; ask your health care provider or pharmacist if you have questions. COMMON BRAND NAME(S): Multaq What should I tell my health care provider before I take this medicine? They need to know if you have any of these conditions: -heart failure -history of irregular heartbeat -liver disease -liver or lung problems with the past use of amiodarone -low levels of magnesium in the blood -low levels of potassium in the blood -other heart disease -an unusual or allergic reaction to dronedarone, other medicines, foods, dyes, or preservatives -pregnant or trying to get pregnant -breast-feeding How should I use this medicine? Take this medicine by mouth with a glass of water. Follow the directions on the prescription label. Take one tablet with the morning meal and one tablet with the  evening meal. Do not take your medicine more often than directed. Do not stop taking except on the advice of your doctor or health care professional. A special MedGuide will be given to you by the pharmacist with each prescription and refill. Be sure to read this information carefully each time. Talk to your pediatrician regarding the use of this medicine in children. Special care may be needed. Overdosage: If you think you have taken too much of this medicine contact a poison control center or emergency room at once. NOTE: This medicine is only for you. Do not share this medicine with others. What if I miss a dose? If you miss a dose, take it as soon as you can. If it is almost time for your next dose, take only that dose. Do not take double or extra doses. What may interact with this medicine? Do not take this medicine with any of the following medications: -arsenic trioxide -certain antibiotics like clarithromycin, erythromycin, pentamidine, telithromycin, troleandomycin -certain medicines for depression like tricyclic antidepressants -certain medicines for fungal infections like fluconazole, itraconazole, ketoconazole, posaconazole, voriconazole -certain medicines for irregular heart beat like amiodarone, disopyramide, dofetilide, flecainide, ibutilide, quinidine, propafenone, sotalol -certain medicines for malaria like chloroquine, halofantrine -cisapride -cyclosporine -droperidol -haloperidol -methadone -other medicines that prolong the QT interval (cause an abnormal heart rhythm) -pimozide -nefazodone -phenothiazines like chlorpromazine, mesoridazine, prochlorperazine, thioridazine -ritonavir -ziprasidone This medicine may also interact with the following medications: -certain medicines for blood pressure, heart disease, or irregular heart beat like diltiazem, metoprolol, propranolol, verapamil -certain medicines for cholesterol like atorvastatin, lovastatin, simvastatin -certain  medicines for seizures like carbamazepine, phenobarbital, phenytoin -digoxin -grapefruit juice -rifampin -sirolimus -St. John's Wort -tacrolimus  This list may not describe all possible interactions. Give your health care provider a list of all the medicines, herbs, non-prescription drugs, or dietary supplements you use. Also tell them if you smoke, drink alcohol, or use illegal drugs. Some items may interact with your medicine. What should I watch for while using this medicine? Your condition will be monitored closely when you first begin therapy. Often, this drug is first started in a hospital or other monitored health care setting. Once you are on maintenance therapy, visit your doctor or health care professional for regular checks on your progress. Because your condition and use of this medicine carry some risk, it is a good idea to carry an identification card, necklace or bracelet with details of your condition, medications, and doctor or health care professional. Dennis Bast may get drowsy or dizzy. Do not drive, use machinery, or do anything that needs mental alertness until you know how this medicine affects you. Do not stand or sit up quickly, especially if you are an older patient. This reduces the risk of dizzy or fainting spells. What side effects may I notice from receiving this medicine? Side effects that you should report to your doctor or health care professional as soon as possible: -allergic reactions like skin rash, itching or hives, swelling of the face, lips, or tongue -breathing problems -cough -dark urine -fast, irregular heartbeat -general ill feeling or flu-like symptoms -light-colored stools -loss of appetite, nausea -right upper belly pain -slow heartbeat -stomach pain -swelling of the legs or ankles -unusually weak or tired -weight gain -yellowing of the eyes or skin Side effects that usually do not require medical attention (report to your doctor or health care  professional if they continue or are bothersome): -nausea -vomiting -stomach pain This list may not describe all possible side effects. Call your doctor for medical advice about side effects. You may report side effects to FDA at 1-800-FDA-1088. Where should I keep my medicine? Keep out of the reach of children. Store at room temperature between 15 and 30 degrees C (59 and 86 degrees F). Throw away any unused medicine after the expiration date. NOTE: This sheet is a summary. It may not cover all possible information. If you have questions about this medicine, talk to your doctor, pharmacist, or health care provider.  2018 Elsevier/Gold Standard (2015-12-25 12:43:06)

## 2018-01-17 NOTE — Progress Notes (Signed)
Electrophysiology Office Note   Date:  01/17/2018   ID:  David Bush, David Bush 1934/09/29, MRN [id number]  PCP:  [name], MD  Cardiologist:   Primary Electrophysiologist:  Zeniyah Peaster Meredith Leeds, MD    Chief Complaint  Patient presents with  . Palpitations  . Bradycardia     History of Present Illness: David Bush is a 83 y.o. male who is being seen today for the evaluation of bradycardia, palpitations at the request of [name], MD. Presenting today for electrophysiology evaluation.  He has a history of Barrett's esophagus, CKD stage III, hypertension, hyperlipidemia.  His medications have been adjusted due to his CKD for blood pressure.  He was put on Cardura which has improved his blood pressure control, though he has become progressively bradycardic.  He was having palpitations and thus f wore a Holter monitor that showed episodes of wide-complex tachycardia.  Had a few episodes of resting at night where his heart rate is fast and he feels palpitations.  He also has weakness and fatigue.   Today, he denies symptoms of chest pain, shortness of breath, orthopnea, PND, lower extremity edema, claudication, dizziness, presyncope, syncope, bleeding, or neurologic sequela. The patient is tolerating medications without difficulties.    Past Medical History:  Diagnosis Date  . Barrett's esophagus   . Cancer ([place])   . CKD (chronic kidney disease) stage 3, GFR 30-59 ml/min (HCC)   . Hyperlipidemia   . Hypertension   . Skin cancer    squamous cell of face   Past Surgical History:  Procedure Laterality Date  . CATARACT EXTRACTION, BILATERAL  2012  . KNEE ARTHROSCOPY Left 2010   torn meniscus  . SKIN CANCER EXCISION  2015, 2012  . UPPER GASTROINTESTINAL ENDOSCOPY     barrett's esophagus  . WISDOM TOOTH EXTRACTION  1980     Current Outpatient Medications  Medication Sig Dispense Refill  . acetaminophen (TYLENOL) 500 MG tablet Take 1,000 mg by mouth  every 6 (six) hours as needed for moderate pain.    Marland Kitchen aspirin 81 MG tablet Take 81 mg by mouth at bedtime.     . benazepril (LOTENSIN) 40 MG tablet Take 1 tablet (40 mg total) daily by mouth. 90 tablet 3  . doxazosin (CARDURA) 4 MG tablet Take 1 tablet (4 mg total) by mouth daily. 30 tablet 3  . fluticasone (FLONASE) 50 MCG/ACT nasal spray Place 2 sprays into both nostrils daily. 16 g 6  . Multiple Vitamins-Minerals (CENTRUM SILVER PO) Take 1 tablet by mouth daily.    . pantoprazole (PROTONIX) 40 MG tablet TAKE 1 TABLET DAILY 90 tablet 3  . simvastatin (ZOCOR) 10 MG tablet TAKE 1 TABLET AT BEDTIME 90 tablet 3  . atenolol (TENORMIN) 50 MG tablet TAKE ONE-HALF (1/2) TABLET DAILY (Patient not taking: Reported on 01/17/2018) 90 tablet 3  . dronedarone (MULTAQ) 400 MG tablet Take 1 tablet (400 mg total) by mouth 2 (two) times daily with a meal. 60 tablet 3   No current facility-administered medications for this visit.     Allergies:   Patient has no known allergies.   Social History:  The patient  reports that he quit smoking about 51 years ago. he has never used smokeless tobacco. He reports that he does not drink alcohol or use drugs.   Family History:  The patient's family history includes Esophageal cancer in his mother; Kidney cancer in his sister; Kidney disease in his brother; Other in his brother; Stroke  in his father.    ROS:  Please see the history of present illness.   Otherwise, review of systems is positive for palpitations, dizziness.   All other systems are reviewed and negative.    PHYSICAL EXAM: VS:  BP 138/78   Pulse 82   Ht 6\' 1"  (1.854 m)   Wt 209 lb 3.2 oz (94.9 kg)   BMI 27.60 kg/m  , BMI Body mass index is 27.6 kg/m. GEN: Well nourished, well developed, in no acute distress  HEENT: normal  Neck: no JVD, carotid bruits, or masses Cardiac: RRR; no murmurs, rubs, or gallops,no edema  Respiratory:  clear to auscultation bilaterally, normal work of breathing GI: soft,  nontender, nondistended, + BS MS: no deformity or atrophy  Skin: warm and dry Neuro:  Strength and sensation are intact Psych: euthymic mood, full affect  EKG:  EKG is ordered today. Personal review of the ekg ordered shows sinus arrhythmia, nonspecific T wave abnormality  Recent Labs: 08/25/2017: ALT 18; BUN 22; Creat 1.50; Hemoglobin 14.4; Platelets 189; Potassium 4.6; Sodium 137    Lipid Panel     Component Value Date/Time   CHOL 153 08/25/2017 0801   TRIG 196 (H) 08/25/2017 0801   HDL 37 (L) 08/25/2017 0801   CHOLHDL 4.1 08/25/2017 0801   VLDL 42 (H) 08/20/2016 1105   LDLCALC 70 08/20/2016 1105     Wt Readings from Last 3 Encounters:  01/17/18 209 lb 3.2 oz (94.9 kg)  12/26/17 211 lb (95.7 kg)  10/26/17 207 lb (93.9 kg)      Other studies Reviewed: Additional studies/ records that were reviewed today include: Epic notes   Holter monitor 01/05/18-personally reviewed Minimum heart rate 41 bpm at 2:25 AM Heart rate 144 bpm 6:05 PM Average heart rate 54 bpm Sinus rhythm with APCs and PVCs Wide-complex tachycardia, irregular, likely atrial fibrillation  ASSESSMENT AND PLAN:  1.  Hypertension: Blood pressure well controlled today.  Continue current medications.  2.  Hyperlipidemia: Continue Zocor  3. Paroxysmal atrial fibrillation: Did anticoagulate.  Have a wide-complex tachycardia on Holter monitor that was quite regular.  This appears to be due to runs of atrial fibrillation.  He has had a few seconds worth of tachycardia, this does not appear to put him at high risk of stroke.  If he does have a more prolonged episodes, would plan to anticoagulate.  As he is having symptoms of palpitations, we Vermelle Cammarata start him on Multitak today.  We Jing Howatt try to avoid AV nodal blockers.  This patients CHA2DS2-VASc Score and unadjusted Ischemic Stroke Rate (% per year) is equal to 3.2 % stroke rate/year from a score of 3  Above score calculated as 1 point each if present [CHF, HTN,  DM, Vascular=MI/PAD/Aortic Plaque, Age if 65-74, or Male] Above score calculated as 2 points each if present [Age > 75, or Stroke/TIA/TE]    Current medicines are reviewed at length with the patient today.   The patient does not have concerns regarding his medicines.  The following changes were made today:  Multaq  Labs/ tests ordered today include:  Orders Placed This Encounter  Procedures  . TSH  . ECHOCARDIOGRAM COMPLETE     Disposition:   FU with Kenyona Rena 3 months  Signed, Dali Kraner Meredith Leeds, MD  01/17/2018 9:05 AM     [facility] [address] [place] [address] [phone] (office) [phone] (fax)

## 2018-01-18 ENCOUNTER — Other Ambulatory Visit: Payer: Self-pay | Admitting: *Deleted

## 2018-01-18 NOTE — Telephone Encounter (Signed)
Patients wife requested [facility] as [facility] will be [facility]. I questioned if they would rather wait to see if he tolerates the medication as it is new for him, but she stated that they would just have the rx cancelled if that was the case. Okay to go ahead and send this in as requested? Please advise. Thanks, MI

## 2018-01-19 MED ORDER — DRONEDARONE HCL 400 MG PO TABS: 400.0000 mg | ORAL_TABLET | Freq: Two times a day (BID) | ORAL | 3 refills | Status: DC

## 2018-01-19 NOTE — Telephone Encounter (Signed)
Ok to send to [facility]

## 2018-01-23 ENCOUNTER — Ambulatory Visit (HOSPITAL_COMMUNITY): Payer: Medicare Other | Attending: Cardiovascular Disease

## 2018-01-23 ENCOUNTER — Other Ambulatory Visit: Payer: Self-pay

## 2018-01-23 DIAGNOSIS — I350 Nonrheumatic aortic (valve) stenosis: Secondary | ICD-10-CM | POA: Diagnosis not present

## 2018-01-23 DIAGNOSIS — R002 Palpitations: Secondary | ICD-10-CM | POA: Diagnosis not present

## 2018-01-23 DIAGNOSIS — I480 Paroxysmal atrial fibrillation: Secondary | ICD-10-CM | POA: Insufficient documentation

## 2018-01-23 DIAGNOSIS — Z87891 Personal history of nicotine dependence: Secondary | ICD-10-CM | POA: Insufficient documentation

## 2018-01-23 DIAGNOSIS — E785 Hyperlipidemia, unspecified: Secondary | ICD-10-CM | POA: Diagnosis not present

## 2018-01-23 DIAGNOSIS — I129 Hypertensive chronic kidney disease with stage 1 through stage 4 chronic kidney disease, or unspecified chronic kidney disease: Secondary | ICD-10-CM | POA: Diagnosis not present

## 2018-01-23 DIAGNOSIS — N189 Chronic kidney disease, unspecified: Secondary | ICD-10-CM | POA: Insufficient documentation

## 2018-01-30 ENCOUNTER — Ambulatory Visit (INDEPENDENT_AMBULATORY_CARE_PROVIDER_SITE_OTHER): Payer: Medicare Other | Admitting: Family Medicine

## 2018-01-30 ENCOUNTER — Encounter: Payer: Self-pay | Admitting: Family Medicine

## 2018-01-30 VITALS — BP 140/60 | HR 76 | Temp 98.9°F | Resp 16 | Ht 73.0 in | Wt 208.0 lb

## 2018-01-30 DIAGNOSIS — I10 Essential (primary) hypertension: Secondary | ICD-10-CM | POA: Diagnosis not present

## 2018-01-30 DIAGNOSIS — I480 Paroxysmal atrial fibrillation: Secondary | ICD-10-CM | POA: Diagnosis not present

## 2018-01-30 NOTE — Progress Notes (Signed)
Subjective:    Patient ID: David Bush, male    DOB: 07/16/1934, 84 y.o.   MRN: [id number]  Hypertension    10/11/17 At the patient's physical exam in September, I discontinued hydrochlorothiazide due to declining renal function and chronic kidney disease to avoid worsening the underlying problem.  However since making these changes, his blood pressure has steadily increased and his average systolic blood pressure ranges between 150 and 160/80-90.  He is currently on benazepril 10 mg a day in addition to metoprolol.  He also has relative bradycardia although he is asymptomatic.  At that time, my plan was: Patient's blood pressure is elevated today.  I have recommended increasing benazepril from 10 mg a day to 40 mg a day and monitoring renal function and blood pressure more closely.  I explained to the patient that the higher dose ACE inhibitor would be renal protective.  However also explained that it could cause his creatinine to rise as much as 30%.  We will need to watch this closely along with his blood pressure.  If his renal function declines, we may need to discontinue the benazepril or at least reduce the dose and replace it with Cardura  10/26/17 Blood pressure remains significantly elevated despite being on benazepril 40 mg a day.  He reports similar values at home.  He denies any chest pain.  Over the last 2 days he has developed a head cold.  He reports pain and pressure in his frontal sinuses.  He reports rhinorrhea and postnasal drip.  He reports sore scratchy throat and a nonproductive cough that is worse at night when he lies down.  He denies any shortness of breath or purulent sputum.  At that time, my plan was: Patient's blood pressure is too high.  Continue lisinopril 40 mg a day but add Cardura 4 mg a day and recheck blood pressure and BMP in 1 month.  Monitor for orthostatic dizziness.  Patient also appears to be developing a viral upper respiratory infection.  I explained  to the patient that this must resolve on its own.  Antibiotics will not help.  He can take Mucinex DM for cough and chest congestion.  Allow 1 week to run its course.  I did give him Hycodan 1 teaspoon every 8 hours as needed for severe cough particularly at night when he cannot sleep.  Because of the holiday weekend, I did give him a Z-Pak with strict instructions not to fill unless he develops a fever greater than 101, worsening cough, increasing chest congestion and/or purulent sputum.  12/26/17 Patient's blood pressure has been averaging 140-160/80. His urine output has improved since beginning Cardura. His average blood pressures 150. However his bradycardia is getting worse. Furthermore he had an episode of palpitations 2 weeks ago while trying to sleep where his heart rate was erratic for several minutes. His any chest pain shortness of breath dyspnea on exertion or strokelike symptoms.  At that time, my plan was:  I will schedule the patient for Holter monitor to evaluate the cause of his palpitations. I suspect PVCs coupled with bradycardia. Discontinue atenolol and switch to a more selective beta blocker Bystolic 10 mg a day and recheck blood pressure in one month. If blood pressure remains elevated, patient would like just to resume hydrochlorothiazide at his previous dose since this worked well for him  01/30/18 Holter monitor showed a wide complex tachycardia lasting for 8 seconds with features consistent with A. fib with RVR.  Has since seen cardiology who has started the patient on multaq in an effort to try to control his heart rate and prevent runs of A. fib.  He discontinued his beta-blocker due to bradycardia.  Patient states that he is having worsening palpitations.  He cannot sleep at night due to palpitations.  I am not certain if this is due to withdrawal from his beta-blocker or whether this is due to runs of atrial fibrillation.  On his exam today he has an irregularly irregular heart  rhythm with frequent PVCs versus atrial fibrillation.  Therefore I will obtain an EKG to verify.  Meanwhile he cannot tolerate doxazosin.  He reports orthostatic dizziness.  He wants to resume his hydrochlorothiazide despite his chronic renal insufficiency. Past Medical History:  Diagnosis Date  . Barrett's esophagus   . Cancer ([place])   . CKD (chronic kidney disease) stage 3, GFR 30-59 ml/min (HCC)   . Hyperlipidemia   . Hypertension   . Skin cancer    squamous cell of face   Past Surgical History:  Procedure Laterality Date  . CATARACT EXTRACTION, BILATERAL  2012  . KNEE ARTHROSCOPY Left 2010   torn meniscus  . SKIN CANCER EXCISION  2015, 2012  . UPPER GASTROINTESTINAL ENDOSCOPY     barrett's esophagus  . WISDOM TOOTH EXTRACTION  1980   Current Outpatient Medications on File Prior to Visit  Medication Sig Dispense Refill  . acetaminophen (TYLENOL) 500 MG tablet Take 1,000 mg by mouth every 6 (six) hours as needed for moderate pain.    Marland Kitchen aspirin 81 MG tablet Take 81 mg by mouth at bedtime.     . benazepril (LOTENSIN) 40 MG tablet Take 1 tablet (40 mg total) daily by mouth. 90 tablet 3  . doxazosin (CARDURA) 4 MG tablet Take 1 tablet (4 mg total) by mouth daily. 30 tablet 3  . dronedarone (MULTAQ) 400 MG tablet Take 1 tablet (400 mg total) by mouth 2 (two) times daily with a meal. 180 tablet 3  . fluticasone (FLONASE) 50 MCG/ACT nasal spray Place 2 sprays into both nostrils daily. 16 g 6  . Multiple Vitamins-Minerals (CENTRUM SILVER PO) Take 1 tablet by mouth daily.    . pantoprazole (PROTONIX) 40 MG tablet TAKE 1 TABLET DAILY 90 tablet 3  . simvastatin (ZOCOR) 10 MG tablet TAKE 1 TABLET AT BEDTIME 90 tablet 3   No current facility-administered medications on file prior to visit.    No Known Allergies Social History   Socioeconomic History  . Marital status: Married    Spouse name: Not on file  . Number of children: Not on file  . Years of education: Not on file  . Highest  education level: Not on file  Social Needs  . Financial resource strain: Not on file  . Food insecurity - worry: Not on file  . Food insecurity - inability: Not on file  . Transportation needs - medical: Not on file  . Transportation needs - non-medical: Not on file  Occupational History  . Not on file  Tobacco Use  . Smoking status: Former Smoker    Last attempt to quit: 12/06/1966    Years since quitting: 51.1  . Smokeless tobacco: Never Used  Substance and Sexual Activity  . 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  . Drug use: No  . Sexual activity: Not on file  Other Topics Concern  . Not on file  Social History Narrative  . Not on file     Review of Systems  All other systems reviewed and are negative.      Objective:   Physical Exam  Constitutional: He appears well-developed and well-nourished. No distress.  HENT:  Right Ear: External ear normal.  Left Ear: External ear normal.  Nose: Nose normal.  Mouth/Throat: Oropharynx is clear and moist. No oropharyngeal exudate.  Eyes: Conjunctivae are normal.  Neck: Neck supple. No JVD present.  Cardiovascular: Normal rate and normal heart sounds. An irregularly irregular rhythm present. Exam reveals no gallop and no friction rub.  No murmur heard. Pulmonary/Chest: Effort normal and breath sounds normal. No respiratory distress. He has no wheezes. He has no rales.  Abdominal: Soft. Bowel sounds are normal. He exhibits no distension. There is no tenderness.  Musculoskeletal: He exhibits no edema.  Lymphadenopathy:    He has no cervical adenopathy.  Skin: He is not diaphoretic.  Vitals reviewed.         Assessment & Plan:  Benign essential HTN  Paroxysmal atrial fibrillation (HCC) At this point, we will discontinue doxazosin due to orthostatic dizziness.  Patient can resume his hydrochlorothiazide and we will monitor his renal function every 4 months.  Regarding his  palpitations, his exam today is concerning for atrial fibrillation.  I will obtain an EKG to verify.  EKG today shows normal sinus rhythm with definite P waves however there are frequent PACs.  Therefore there is no reason for anticoagulation but yet.  However I recommended that he call his cardiologist and discussed the frequent palpitations he is experiencing.  His cardiologist may want to have a longer cardiac monitor to determine if he is having more frequent runs of atrial fibrillation versus frequent PACs as the management options would likely differ depending upon the source of his palpitations.  Patient states that he will call his cardiologist and arrange follow-up.

## 2018-01-31 ENCOUNTER — Encounter: Payer: Self-pay | Admitting: Cardiology

## 2018-01-31 ENCOUNTER — Ambulatory Visit (INDEPENDENT_AMBULATORY_CARE_PROVIDER_SITE_OTHER): Payer: Medicare Other | Admitting: Cardiology

## 2018-01-31 VITALS — BP 124/72 | HR 60 | Ht 73.0 in | Wt 209.0 lb

## 2018-01-31 DIAGNOSIS — E785 Hyperlipidemia, unspecified: Secondary | ICD-10-CM

## 2018-01-31 DIAGNOSIS — I480 Paroxysmal atrial fibrillation: Secondary | ICD-10-CM | POA: Diagnosis not present

## 2018-01-31 DIAGNOSIS — Z79899 Other long term (current) drug therapy: Secondary | ICD-10-CM

## 2018-01-31 DIAGNOSIS — I10 Essential (primary) hypertension: Secondary | ICD-10-CM | POA: Diagnosis not present

## 2018-01-31 MED ORDER — METOPROLOL SUCCINATE ER 25 MG PO TB24: 25.0000 mg | ORAL_TABLET | Freq: Every day | ORAL | 3 refills | Status: DC

## 2018-01-31 MED ORDER — FLECAINIDE ACETATE 100 MG PO TABS: 100.0000 mg | ORAL_TABLET | Freq: Two times a day (BID) | ORAL | 3 refills | Status: DC

## 2018-01-31 NOTE — Patient Instructions (Addendum)
Medication Instructions:  Your physician has recommended you make the following change in your medication:  1. START Eliquis 5 mg twice daily  - DO NOT START THIS MEDICATION UNTIL THE OFFICE CALLS AND TELLS YOU TO (WE WILL CHECK YOUR KIDNEY FUNCTION) 2. START Metoprolol Succinate (Toprol) 25 mg once daily 3. STOP Multaq 4. START Flecainide 100 mg twice daily -- YOU WILL START THIS MEDICATION 7-10 DAYS PRIOR TO STRESS TEST.  * If you need a refill on your cardiac medications before your next appointment, please call your pharmacy. *  Labwork: BMET today  Testing/Procedures: Your physician has requested that you have an exercise tolerance test - at least 10 days from now.. For further information please visit [email or web address]. Please also follow instruction sheet, as given.  Follow-Up: Keep your follow up on 5/6 with Dr. Curt Bears.   Thank you for choosing [facility]!!   Trinidad Curet, RN [phone]  Any Other Special Instructions Will Be Listed Below (If Applicable).  Flecainide tablets What is this medicine? FLECAINIDE (FLEK a nide) is an antiarrhythmic drug. This medicine is used to prevent irregular heart rhythm. It can also slow down fast heartbeats called tachycardia. This medicine may be used for other purposes; ask your health care provider or pharmacist if you have questions. COMMON BRAND NAME(S): Tambocor What should I tell my health care provider before I take this medicine? They need to know if you have any of these conditions: -abnormal levels of potassium in the blood -heart disease including heart rhythm and heart rate problems -kidney or liver disease -recent heart attack -an unusual or allergic reaction to flecainide, local anesthetics, other medicines, foods, dyes, or preservatives -pregnant or trying to get pregnant -breast-feeding How should I use this medicine? Take this medicine by mouth with a glass of water. Follow the directions on the  prescription label. You can take this medicine with or without food. Take your doses at regular intervals. Do not take your medicine more often than directed. Do not stop taking this medicine suddenly. This may cause serious, heart-related side effects. If your doctor wants you to stop the medicine, the dose may be slowly lowered over time to avoid any side effects. Talk to your pediatrician regarding the use of this medicine in children. While this drug may be prescribed for children as young as 1 year of age for selected conditions, precautions do apply. Overdosage: If you think you have taken too much of this medicine contact a poison control center or emergency room at once. NOTE: This medicine is only for you. Do not share this medicine with others. What if I miss a dose? If you miss a dose, take it as soon as you can. If it is almost time for your next dose, take only that dose. Do not take double or extra doses. What may interact with this medicine? Do not take this medicine with any of the following medications: -amoxapine -arsenic trioxide -certain antibiotics like clarithromycin, erythromycin, gatifloxacin, gemifloxacin, levofloxacin, moxifloxacin, sparfloxacin, or troleandomycin -certain antidepressants called tricyclic antidepressants like amitriptyline, imipramine, or nortriptyline -certain medicines to control heart rhythm like disopyramide, dofetilide, encainide, moricizine, procainamide, propafenone, and quinidine -cisapride -cyclobenzaprine -delavirdine -droperidol -haloperidol -hawthorn -imatinib -levomethadyl -maprotiline -medicines for malaria like chloroquine and halofantrine -pentamidine -phenothiazines like chlorpromazine, mesoridazine, prochlorperazine, thioridazine -pimozide -quinine -ranolazine -ritonavir -sertindole -ziprasidone This medicine may also interact with the following medications: -cimetidine -medicines for angina or high blood  pressure -medicines to control heart rhythm like amiodarone and digoxin This list  may not describe all possible interactions. Give your health care provider a list of all the medicines, herbs, non-prescription drugs, or dietary supplements you use. Also tell them if you smoke, drink alcohol, or use illegal drugs. Some items may interact with your medicine. What should I watch for while using this medicine? Visit your doctor or health care professional for regular checks on your progress. Because your condition and the use of this medicine carries some risk, it is a good idea to carry an identification card, necklace or bracelet with details of your condition, medications and doctor or health care professional. Check your blood pressure and pulse rate regularly. Ask your health care professional what your blood pressure and pulse rate should be, and when you should contact him or her. Your doctor or health care professional also may schedule regular blood tests and electrocardiograms to check your progress. You may get drowsy or dizzy. Do not drive, use machinery, or do anything that needs mental alertness until you know how this medicine affects you. Do not stand or sit up quickly, especially if you are an older patient. This reduces the risk of dizzy or fainting spells. Alcohol can make you more dizzy, increase flushing and rapid heartbeats. Avoid alcoholic drinks. What side effects may I notice from receiving this medicine? Side effects that you should report to your doctor or health care professional as soon as possible: -chest pain, continued irregular heartbeats -difficulty breathing -swelling of the legs or feet -trembling, shaking -unusually weak or tired Side effects that usually do not require medical attention (report to your doctor or health care professional if they continue or are bothersome): -blurred vision -constipation -headache -nausea, vomiting -stomach pain This list may not  describe all possible side effects. Call your doctor for medical advice about side effects. You may report side effects to FDA at 1-800-FDA-1088. Where should I keep my medicine? Keep out of the reach of children. Store at room temperature between 15 and 30 degrees C (59 and 86 degrees F). Protect from light. Keep container tightly closed. Throw away any unused medicine after the expiration date. NOTE: This sheet is a summary. It may not cover all possible information. If you have questions about this medicine, talk to your doctor, pharmacist, or health care provider.  2018 Elsevier/Gold Standard (2008-03-27 16:46:09)

## 2018-01-31 NOTE — Progress Notes (Signed)
Electrophysiology Office Note   Date:  01/31/2018   ID:  David Bush [birth year], MRN [id number]  PCP:  David Frizzle, MD  Cardiologist:   Primary Electrophysiologist:  David Bark Meredith Leeds, MD    Chief Complaint  Patient presents with  . Follow-up    PAF/PAC's     History of Present Illness: David Bush is a 84 y.o. male who is being seen today for the evaluation of bradycardia, palpitations at the request of David Frizzle, MD. Presenting today for electrophysiology evaluation.  He has a history of Barrett's esophagus, CKD stage III, hypertension, hyperlipidemia.  His medications have been adjusted due to his CKD for blood pressure.    Today, denies symptoms of palpitations, chest pain, shortness of breath, orthopnea, PND, lower extremity edema, claudication, dizziness, presyncope, syncope, bleeding, or neurologic sequela. The patient is tolerating medications without difficulties.  He is continuing to have episodic palpitations.  Occur for extended periods of time, up to a few hours.  He does not feel like the Multitak has been of much help.  Past Medical History:  Diagnosis Date  . Barrett's esophagus   . Cancer ([place])   . CKD (chronic kidney disease) stage 3, GFR 30-59 ml/min (HCC)   . Hyperlipidemia   . Hypertension   . Skin cancer    squamous cell of face   Past Surgical History:  Procedure Laterality Date  . CATARACT EXTRACTION, BILATERAL  2012  . KNEE ARTHROSCOPY Left 2010   torn meniscus  . SKIN CANCER EXCISION  2015, 2012  . UPPER GASTROINTESTINAL ENDOSCOPY     barrett's esophagus  . WISDOM TOOTH EXTRACTION  1980     Current Outpatient Medications  Medication Sig Dispense Refill  . acetaminophen (TYLENOL) 500 MG tablet Take 1,000 mg by mouth every 6 (six) hours as needed for moderate pain.    Marland Kitchen aspirin 81 MG tablet Take 81 mg by mouth at bedtime.     . benazepril (LOTENSIN) 40 MG tablet Take 1 tablet (40 mg total) daily by mouth.  90 tablet 3  . fluticasone (FLONASE) 50 MCG/ACT nasal spray Place 2 sprays into both nostrils daily. 16 g 6  . Multiple Vitamins-Minerals (CENTRUM SILVER PO) Take 1 tablet by mouth daily.    . pantoprazole (PROTONIX) 40 MG tablet TAKE 1 TABLET DAILY 90 tablet 3  . simvastatin (ZOCOR) 10 MG tablet TAKE 1 TABLET AT BEDTIME 90 tablet 3  . flecainide (TAMBOCOR) 100 MG tablet Take 1 tablet (100 mg total) by mouth 2 (two) times daily. 60 tablet 3  . metoprolol succinate (TOPROL-XL) 25 MG 24 hr tablet Take 1 tablet (25 mg total) by mouth daily. Take with or immediately following a meal. 30 tablet 3   No current facility-administered medications for this visit.     Allergies:   Patient has no known allergies.   Social History:  The patient  reports that he quit smoking about 51 years ago. he has never used smokeless tobacco. He reports that he does not drink alcohol or use drugs.   Family History:  The patient's family history includes Esophageal cancer in his mother; Kidney cancer in his sister; Kidney disease in his brother; Other in his brother; Stroke in his father.   ROS:  Please see the history of present illness.   Otherwise, review of systems is positive for palpitations.   All other systems are reviewed and negative.   PHYSICAL EXAM: VS:  BP 124/72   Pulse  60   Ht 6\' 1"  (1.854 m)   Wt 209 lb (94.8 kg)   BMI 27.57 kg/m  , BMI Body mass index is 27.57 kg/m. GEN: Well nourished, well developed, in no acute distress  HEENT: normal  Neck: no JVD, carotid bruits, or masses Cardiac: RRR; no murmurs, rubs, or gallops,no edema  Respiratory:  clear to auscultation bilaterally, normal work of breathing GI: soft, nontender, nondistended, + BS MS: no deformity or atrophy  Skin: warm and dry Neuro:  Strength and sensation are intact Psych: euthymic mood, full affect  EKG:  EKG is not ordered today. Personal review of the ekg ordered 01/30/18 shows sinus rhythm, PACs  Recent  Labs: 08/25/2017: ALT 18; BUN 22; Creat 1.50; Hemoglobin 14.4; Platelets 189; Potassium 4.6; Sodium 137 01/17/2018: TSH 3.500    Lipid Panel     Component Value Date/Time   CHOL 153 08/25/2017 0801   TRIG 196 (H) 08/25/2017 0801   HDL 37 (L) 08/25/2017 0801   CHOLHDL 4.1 08/25/2017 0801   VLDL 42 (H) 08/20/2016 1105   LDLCALC 70 08/20/2016 1105     Wt Readings from Last 3 Encounters:  01/31/18 209 lb (94.8 kg)  01/30/18 208 lb (94.3 kg)  01/17/18 209 lb 3.2 oz (94.9 kg)      Other studies Reviewed: Additional studies/ records that were reviewed today include: Epic notes   Holter monitor 01/05/18-personally reviewed Minimum heart rate 41 bpm at 2:25 AM Heart rate 144 bpm 6:05 PM Average heart rate 54 bpm Sinus rhythm with APCs and PVCs Wide-complex tachycardia, irregular, likely atrial fibrillation  ASSESSMENT AND PLAN:  1.  Hypertension: Controlled today.  No changes.  2.  Hyperlipidemia: Continue Zocor  3. Paroxysmal atrial fibrillation: Continuing to have episodic palpitations which have been lasting up to a few hours.  Due to that, it is likely that he would need anticoagulation.  We David Bush start him on Eliquis today.  It also appears that the [facility] that he is on is not helping to control his symptoms.  We David Bush thus start him on flecainide 100 mg and Toprol-XL 25 mg.  He did have bradycardia on his cardiac monitor but this was nocturnal and thus I think he David Bush tolerate the Toprol-XL without issue.  This patients CHA2DS2-VASc Score and unadjusted Ischemic Stroke Rate (% per year) is equal to 3.2 % stroke rate/year from a score of 3  Above score calculated as 1 point each if present [CHF, HTN, DM, Vascular=MI/PAD/Aortic Plaque, Age if 65-74, or Male] Above score calculated as 2 points each if present [Age > 75, or Stroke/TIA/TE]    Current medicines are reviewed at length with the patient today.   The patient does not have concerns regarding his medicines.  The  following changes were made today: Stop Multitak, start flecainide, Toprol-XL  Labs/ tests ordered today include:  Orders Placed This Encounter  Procedures  . Basic Metabolic Panel (BMET)  . Exercise Tolerance Test     Disposition:   FU with [place] 3 months  Signed, Chelsye Suhre Meredith Leeds, MD  01/31/2018 12:42 PM     [facility] [address] [place] [address] [phone] (office) [phone] (fax)

## 2018-02-01 LAB — BASIC METABOLIC PANEL
BUN/Creatinine Ratio: 14 (ref 10–24)
BUN: 22 mg/dL (ref 8–27)
CALCIUM: 9.5 mg/dL (ref 8.6–10.2)
CO2: 22 mmol/L (ref 20–29)
CREATININE: 1.57 mg/dL — AB (ref 0.76–1.27)
Chloride: 103 mmol/L (ref 96–106)
GFR calc Af Amer: 47 mL/min/{1.73_m2} — ABNORMAL LOW (ref >59)
GFR, EST NON AFRICAN AMERICAN: 41 mL/min/{1.73_m2} — AB (ref >59)
GLUCOSE: 78 mg/dL (ref 65–99)
Potassium: 4.7 mmol/L (ref 3.5–5.2)
SODIUM: 142 mmol/L (ref 134–144)

## 2018-02-03 ENCOUNTER — Telehealth: Payer: Self-pay | Admitting: Cardiology

## 2018-02-03 MED ORDER — APIXABAN 2.5 MG PO TABS: 2.5000 mg | ORAL_TABLET | Freq: Two times a day (BID) | ORAL | 0 refills | Status: DC

## 2018-02-03 NOTE — Telephone Encounter (Signed)
Advised to discontinue ASA. 2.5 mg tablet sent to [facility] - w/ 30 day free card. Samples also left at front desk for pt to pick up next week. Patient verbalized understanding and agreeable to plan.

## 2018-02-03 NOTE — Telephone Encounter (Signed)
New Message   Pt c/o medication issue:  1. Name of Medication: Eliquis and Aspirin  2. How are you currently taking this medication (dosage and times per day)? 5mg  and 81  3. Are you having a reaction (difficulty breathing--STAT)? no  4. What is your medication issue? Pt's wife is calling to see if its ok to take his 73 aspirin with the Eliquis and to also change the Eliquis to 2.5 mg. Please call

## 2018-02-08 ENCOUNTER — Telehealth: Payer: Self-pay | Admitting: Cardiology

## 2018-02-08 ENCOUNTER — Telehealth: Payer: Self-pay | Admitting: Family Medicine

## 2018-02-08 NOTE — Telephone Encounter (Signed)
Patient called [facility] stating that you had mentioned to him about a med that would help shrink his prostate and he would like that sent to his mail order pharmacy.  Please advise on which med.

## 2018-02-08 NOTE — Telephone Encounter (Signed)
Pt tells me that he only noticed HR low one time.  Reported by his BP cuff. Denies any symptoms such as syncope, dizziness or weakness. Discussed with patient the importance of palpating his pulse for a full minute if cuff gives him a low HR and/or symptoms occur, such as light headedness. Dr. Curt Bears aware and agreeable to plan. Patient verbalized understanding and agreeable to plan.

## 2018-02-08 NOTE — Telephone Encounter (Signed)
David Bush is calling because his heart rate is down to 42. Dr.Camnitz started him on a new medication(Toprolol 25 mg)  and he wondering if that is causing his heart rate to be low? Please call   Thanks

## 2018-02-09 MED ORDER — FINASTERIDE 5 MG PO TABS: 5.0000 mg | ORAL_TABLET | Freq: Every day | ORAL | 3 refills | Status: DC

## 2018-02-09 NOTE — Telephone Encounter (Signed)
Medication called/sent to requested pharmacy  

## 2018-02-09 NOTE — Telephone Encounter (Signed)
Could try finasteride 5 mg poqday

## 2018-02-10 ENCOUNTER — Ambulatory Visit: Payer: Medicare Other

## 2018-02-10 ENCOUNTER — Other Ambulatory Visit: Payer: Self-pay | Admitting: Cardiology

## 2018-02-10 ENCOUNTER — Telehealth: Payer: Self-pay | Admitting: *Deleted

## 2018-02-10 DIAGNOSIS — R079 Chest pain, unspecified: Secondary | ICD-10-CM

## 2018-02-10 DIAGNOSIS — I493 Ventricular premature depolarization: Secondary | ICD-10-CM

## 2018-02-10 LAB — TROPONIN T: Troponin T TROPT: <0.011 ng/mL (ref <0.011)

## 2018-02-10 NOTE — Telephone Encounter (Signed)
Pt seen in office today for GXT post Flecainide start. Pt having CP upon arrival.  GXT cancelled, per Dr. Curt Bears. Order to get Troponin and order rest stress Myoview Information reviewed with patient and instructions given.

## 2018-02-15 ENCOUNTER — Telehealth (HOSPITAL_COMMUNITY): Payer: Self-pay | Admitting: *Deleted

## 2018-02-15 NOTE — Telephone Encounter (Signed)
Left message on voicemail in reference to upcoming appointment scheduled for 02/20/18. Phone number given for a call back so details instructions can be given.  David Bush   

## 2018-02-16 ENCOUNTER — Other Ambulatory Visit: Payer: Self-pay | Admitting: *Deleted

## 2018-02-16 ENCOUNTER — Telehealth (HOSPITAL_COMMUNITY): Payer: Self-pay | Admitting: *Deleted

## 2018-02-16 MED ORDER — APIXABAN 2.5 MG PO TABS: 2.5000 mg | ORAL_TABLET | Freq: Two times a day (BID) | ORAL | 3 refills | Status: DC

## 2018-02-16 NOTE — Telephone Encounter (Signed)
Patient given detailed instructions per Myocardial Perfusion Study Information Sheet for the test on 02/20/18. Patient notified to arrive 15 minutes early and that it is imperative to arrive on time for appointment to keep from having the test rescheduled.  If you need to cancel or reschedule your appointment, please call the office within 24 hours of your appointment. . Patient verbalized understanding. David Bush

## 2018-02-16 NOTE — Telephone Encounter (Signed)
Sent in 90 day supply w/ refills to [facility]

## 2018-02-20 ENCOUNTER — Ambulatory Visit (HOSPITAL_COMMUNITY): Payer: Medicare Other | Attending: Cardiovascular Disease

## 2018-02-20 DIAGNOSIS — I10 Essential (primary) hypertension: Secondary | ICD-10-CM | POA: Insufficient documentation

## 2018-02-20 DIAGNOSIS — I4891 Unspecified atrial fibrillation: Secondary | ICD-10-CM | POA: Diagnosis not present

## 2018-02-20 DIAGNOSIS — I493 Ventricular premature depolarization: Secondary | ICD-10-CM | POA: Diagnosis not present

## 2018-02-20 DIAGNOSIS — R079 Chest pain, unspecified: Secondary | ICD-10-CM | POA: Insufficient documentation

## 2018-02-20 LAB — MYOCARDIAL PERFUSION IMAGING
CHL CUP MPHR: 139 {beats}/min
CHL CUP NUCLEAR SRS: 11
CSEPEDS: 45 s
Estimated workload: 10.1 METS
Exercise duration (min): 8 min
LV dias vol: 102 mL (ref 62–150)
LVSYSVOL: 45 mL
NUC STRESS TID: 0.81
Peak HR: 129 {beats}/min
Percent HR: 92 %
RATE: 0.39
Rest HR: 46 {beats}/min
SDS: 3
SSS: 14

## 2018-02-20 MED ORDER — TECHNETIUM TC 99M TETROFOSMIN IV KIT
10.4000 | PACK | Freq: Once | INTRAVENOUS | Status: AC | PRN
  Administered 2018-02-20: 10.4 via INTRAVENOUS
  Filled 2018-02-20: qty 11

## 2018-02-20 MED ORDER — TECHNETIUM TC 99M TETROFOSMIN IV KIT
31.7000 | PACK | Freq: Once | INTRAVENOUS | Status: AC | PRN
  Administered 2018-02-20: 31.7 via INTRAVENOUS
  Filled 2018-02-20: qty 32

## 2018-02-24 ENCOUNTER — Encounter (HOSPITAL_COMMUNITY): Payer: Medicare Other

## 2018-03-01 DIAGNOSIS — D485 Neoplasm of uncertain behavior of skin: Secondary | ICD-10-CM | POA: Diagnosis not present

## 2018-03-01 DIAGNOSIS — L720 Epidermal cyst: Secondary | ICD-10-CM | POA: Diagnosis not present

## 2018-03-02 ENCOUNTER — Encounter: Payer: Self-pay | Admitting: Cardiology

## 2018-03-16 ENCOUNTER — Telehealth: Payer: Self-pay | Admitting: Cardiology

## 2018-03-16 ENCOUNTER — Telehealth: Payer: Self-pay | Admitting: Family Medicine

## 2018-03-16 NOTE — Telephone Encounter (Signed)
Diarrhea is not a common side effect of any of his medicines.  Protonix possibly, but given 4 day history, could be a stomach bug.  I would try probiotic and imodium as needed and see if symptoms resolve over the weekend.

## 2018-03-16 NOTE — Telephone Encounter (Signed)
Pt reports diarrhea for the past 4 days, occurring every morning and lasing about an hour, then resolving.  Informed patient that the Flecainide and Toprol, he was started on at the end of February, both have very small percentages of diarrhea SE. Flecainide 0.7% - 3% SE risk Toprol > 2% - 5% SE risk   Informed that being he is only experiencing once every morning that I don't believe it could be r/t Flecainide as he is not experiencing every evening after taking his evening dose. He understands and agrees. Informed that I am not sure if r/t to Toprol or not. Advised to follow up w/ PCP and make sure diarrhea is not r/t to something else, such as acute illness.   Pt advised to call office back if PCP r/o other causes and we need to switch to different medication to see if resolves. Patient verbalized understanding and agreeable to plan.

## 2018-03-16 NOTE — Telephone Encounter (Signed)
Pt c/o medication issue:  1. Name of Medication:  metoprolol succinate (TOPROL-XL) 25 MG 24 hr tablet    2. How are you currently taking this medication (dosage and times per day)? Take 1 tablet (25 mg total) by mouth daily. Take with or immediately following a meal. 3. Are you having a reaction (difficulty breathing--STAT)? no 4. What is your medication issue? diarrhea

## 2018-03-16 NOTE — Telephone Encounter (Signed)
Spoke with patient's wife and informed her per Dr. Dennard Schaumann- Diarrhea is not a common side effect of any of his medicine. Protonix possibly, but not given a 4 day history, could be a stomach bug. Would try probiotic and imodium as needed and see if symptoms resolve over the weekend. Patient's wife stated that she had contacted patient cardiologist and told that their is a 3% or 2% chance of diarrhea on some medications. Patient's wife stated that she would like an appointment. Transferred to front desk to schedule appointment.

## 2018-03-16 NOTE — Telephone Encounter (Signed)
Patient called in with c/o diarrhea x 4 days. Patient states that he has bouts of diarrhea every morning. Symptoms will last about 1 hour and then resolve. Patient is concerned that it could be 1 of his medications.  He has not tried any antidiarrhea medications. Please advise?

## 2018-03-17 ENCOUNTER — Ambulatory Visit (INDEPENDENT_AMBULATORY_CARE_PROVIDER_SITE_OTHER): Payer: Medicare Other | Admitting: Family Medicine

## 2018-03-17 ENCOUNTER — Encounter: Payer: Self-pay | Admitting: Family Medicine

## 2018-03-17 VITALS — BP 118/60 | HR 54 | Temp 97.7°F | Resp 14 | Ht 73.0 in | Wt 203.0 lb

## 2018-03-17 DIAGNOSIS — R197 Diarrhea, unspecified: Secondary | ICD-10-CM | POA: Diagnosis not present

## 2018-03-17 LAB — COMPLETE METABOLIC PANEL WITH GFR
AG RATIO: 1.8 (calc) (ref 1.0–2.5)
ALBUMIN MSPROF: 4.5 g/dL (ref 3.6–5.1)
ALT: 14 U/L (ref 9–46)
AST: 16 U/L (ref 10–35)
Alkaline phosphatase (APISO): 67 U/L (ref 40–115)
BILIRUBIN TOTAL: 0.5 mg/dL (ref 0.2–1.2)
BUN / CREAT RATIO: 21 (calc) (ref 6–22)
BUN: 32 mg/dL — ABNORMAL HIGH (ref 7–25)
CHLORIDE: 104 mmol/L (ref 98–110)
CO2: 29 mmol/L (ref 20–32)
Calcium: 9.6 mg/dL (ref 8.6–10.3)
Creat: 1.52 mg/dL — ABNORMAL HIGH (ref 0.70–1.11)
GFR, Est African American: 49 mL/min/{1.73_m2} — ABNORMAL LOW (ref >=60)
GFR, Est Non African American: 42 mL/min/{1.73_m2} — ABNORMAL LOW (ref >=60)
GLOBULIN: 2.5 g/dL (ref 1.9–3.7)
GLUCOSE: 114 mg/dL — AB (ref 65–99)
POTASSIUM: 4.5 mmol/L (ref 3.5–5.3)
SODIUM: 138 mmol/L (ref 135–146)
Total Protein: 7 g/dL (ref 6.1–8.1)

## 2018-03-17 LAB — CBC WITH DIFFERENTIAL/PLATELET
Basophils Absolute: 48 cells/uL (ref 0–200)
Basophils Relative: 0.7 %
EOS PCT: 3 %
Eosinophils Absolute: 207 cells/uL (ref 15–500)
HCT: 40.6 % (ref 38.5–50.0)
Hemoglobin: 14.2 g/dL (ref 13.2–17.1)
Lymphs Abs: 1090 cells/uL (ref 850–3900)
MCH: 30.7 pg (ref 27.0–33.0)
MCHC: 35 g/dL (ref 32.0–36.0)
MCV: 87.9 fL (ref 80.0–100.0)
MPV: 10.1 fL (ref 7.5–12.5)
Monocytes Relative: 7.7 %
Neutro Abs: 5023 cells/uL (ref 1500–7800)
Neutrophils Relative %: 72.8 %
PLATELETS: 194 10*3/uL (ref 140–400)
RBC: 4.62 10*6/uL (ref 4.20–5.80)
RDW: 13 % (ref 11.0–15.0)
TOTAL LYMPHOCYTE: 15.8 %
WBC mixed population: 531 cells/uL (ref 200–950)
WBC: 6.9 10*3/uL (ref 3.8–10.8)

## 2018-03-17 MED ORDER — HYDROCHLOROTHIAZIDE 12.5 MG PO CAPS: 12.5000 mg | ORAL_CAPSULE | Freq: Every day | ORAL | 0 refills | Status: DC

## 2018-03-17 NOTE — Progress Notes (Signed)
Subjective:    Patient ID: David Bush, male    DOB: [birth year], 84 y.o.   MRN: [id number]  HPI Patient developed diarrhea 4 days ago.  It tends to hit him only in the morning.  It is within a few hours of waking up, he will have the sudden urge to defecate.  He reports very heavy watery brown diarrhea.  Then the rest of the day he is fine.  He denies any recent travel.  He denies leaving the country.  He denies any recent antibiotic use.  There have been no recent medication changes.  He is not been in the hospital. Past Medical History:  Diagnosis Date  . Barrett's esophagus   . Cancer ([place])   . CKD (chronic kidney disease) stage 3, GFR 30-59 ml/min (HCC)   . Hyperlipidemia   . Hypertension   . Skin cancer    squamous cell of face   Past Surgical History:  Procedure Laterality Date  . CATARACT EXTRACTION, BILATERAL  2012  . KNEE ARTHROSCOPY Left 2010   torn meniscus  . SKIN CANCER EXCISION  2015, 2012  . UPPER GASTROINTESTINAL ENDOSCOPY     barrett's esophagus  . WISDOM TOOTH EXTRACTION  1980   Current Outpatient Medications on File Prior to Visit  Medication Sig Dispense Refill  . acetaminophen (TYLENOL) 500 MG tablet Take 1,000 mg by mouth every 6 (six) hours as needed for moderate pain.    Marland Kitchen apixaban (ELIQUIS) 2.5 MG TABS tablet Take 1 tablet (2.5 mg total) by mouth 2 (two) times daily. 180 tablet 3  . benazepril (LOTENSIN) 40 MG tablet Take 1 tablet (40 mg total) daily by mouth. 90 tablet 3  . finasteride (PROSCAR) 5 MG tablet Take 1 tablet (5 mg total) by mouth daily. 90 tablet 3  . flecainide (TAMBOCOR) 100 MG tablet Take 1 tablet (100 mg total) by mouth 2 (two) times daily. 60 tablet 3  . fluticasone (FLONASE) 50 MCG/ACT nasal spray Place 2 sprays into both nostrils daily. 16 g 6  . metoprolol succinate (TOPROL-XL) 25 MG 24 hr tablet Take 1 tablet (25 mg total) by mouth daily. Take with or immediately following a meal. 30 tablet 3  . Multiple Vitamins-Minerals  (CENTRUM SILVER PO) Take 1 tablet by mouth daily.    . pantoprazole (PROTONIX) 40 MG tablet TAKE 1 TABLET DAILY 90 tablet 3  . simvastatin (ZOCOR) 10 MG tablet TAKE 1 TABLET AT BEDTIME 90 tablet 3   No current facility-administered medications on file prior to visit.    No Known Allergies Social History   Socioeconomic History  . Marital status: Married    Spouse name: Not on file  . Number of children: Not on file  . Years of education: Not on file  . Highest education level: Not on file  Occupational History  . Not on file  Social Needs  . Financial resource strain: Not on file  . Food insecurity:    Worry: Not on file    Inability: Not on file  . Transportation needs:    Medical: Not on file    Non-medical: Not on file  Tobacco Use  . Smoking status: Former Smoker    Last attempt to quit: 12/06/1966    Years since quitting: 51.3  . Smokeless tobacco: Never Used  Substance and Sexual Activity  . 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  . Drug use: No  .  Sexual activity: Not on file  Lifestyle  . Physical activity:    Days per week: Not on file    Minutes per session: Not on file  . Stress: Not on file  Relationships  . Social connections:    Talks on phone: Not on file    Gets together: Not on file    Attends religious service: Not on file    Active member of club or organization: Not on file    Attends meetings of clubs or organizations: Not on file    Relationship status: Not on file  . Intimate partner violence:    Fear of current or ex partner: Not on file    Emotionally abused: Not on file    Physically abused: Not on file    Forced sexual activity: Not on file  Other Topics Concern  . Not on file  Social History Narrative  . Not on file     Review of Systems  All other systems reviewed and are negative.      Objective:   Physical Exam  Constitutional: He appears well-developed and well-nourished.    HENT:  Mouth/Throat: Mucous membranes are dry. No oropharyngeal exudate.  Cardiovascular: Normal rate and regular rhythm.  Pulmonary/Chest: Effort normal and breath sounds normal. No respiratory distress. He has no wheezes. He has no rales.  Abdominal: Soft. Bowel sounds are normal. He exhibits no distension. There is no tenderness. There is no rebound.  Vitals reviewed.         Assessment & Plan:  Diarrhea of presumed infectious origin - Plan: COMPLETE METABOLIC PANEL WITH GFR, CBC with Differential/Platelet  Patient's exam suggests mild dehydration.  Hold HCTZ.  Recommended taking Imodium at night prior to bed and another tablet of Imodium in the morning when he wakes up given the diurnal variation of his diarrhea.  Anticipate gradual self-limited resolution over the next 3-4 days.  At that point he can discontinue the Imodium.  Encourage the patient to drink more Gatorade.  He does report some orthostatic dizziness so hopefully that will improve off the hydrochlorothiazide and by increasing his fluid intake.  Check CBC and CMP.  Recheck in 1 week if no better or sooner if worse.

## 2018-03-20 ENCOUNTER — Encounter (INDEPENDENT_AMBULATORY_CARE_PROVIDER_SITE_OTHER): Payer: Self-pay

## 2018-03-22 ENCOUNTER — Encounter: Payer: Self-pay | Admitting: Family Medicine

## 2018-04-04 ENCOUNTER — Telehealth: Payer: Self-pay

## 2018-04-04 NOTE — Telephone Encounter (Signed)
-----   Message from [employer], [place] sent at 04/04/2018 4:34 PM EDT -----  Patient is requesting 90 day supply of Metoprolol succinate 25 mg daily and flecainide 100 mg BID, I saw telephone note about patient concerns with diarrhea caused by these medications. Should I go ahead and refill for the 90 day supply to [facility] or wait until patient sees primary care doctor? Thanks.

## 2018-04-04 NOTE — Telephone Encounter (Signed)
Ok to refill for 90 days  

## 2018-04-05 MED ORDER — FLECAINIDE ACETATE 100 MG PO TABS: 100.0000 mg | ORAL_TABLET | Freq: Two times a day (BID) | ORAL | 0 refills | Status: DC

## 2018-04-05 MED ORDER — METOPROLOL SUCCINATE ER 25 MG PO TB24: 25.0000 mg | ORAL_TABLET | Freq: Every day | ORAL | 0 refills | Status: DC

## 2018-04-10 ENCOUNTER — Ambulatory Visit: Payer: Medicare Other | Admitting: Cardiology

## 2018-04-18 ENCOUNTER — Encounter: Payer: Self-pay | Admitting: Cardiology

## 2018-04-18 ENCOUNTER — Ambulatory Visit (INDEPENDENT_AMBULATORY_CARE_PROVIDER_SITE_OTHER): Payer: Medicare Other | Admitting: Cardiology

## 2018-04-18 VITALS — BP 140/68 | HR 48 | Ht 73.0 in | Wt 203.6 lb

## 2018-04-18 DIAGNOSIS — I10 Essential (primary) hypertension: Secondary | ICD-10-CM | POA: Diagnosis not present

## 2018-04-18 DIAGNOSIS — E785 Hyperlipidemia, unspecified: Secondary | ICD-10-CM | POA: Diagnosis not present

## 2018-04-18 DIAGNOSIS — I480 Paroxysmal atrial fibrillation: Secondary | ICD-10-CM

## 2018-04-18 NOTE — Progress Notes (Signed)
Electrophysiology Office Note   Date:  04/18/2018   ID:  David Bush, DOB 03/21/1934, MRN [id number]  PCP:  David Frizzle, MD  Cardiologist:   Primary Electrophysiologist:  David Meredith Leeds, MD    Chief Complaint  Patient presents with  . Atrial Fibrillation     History of Present Illness: David Bush is a 84 y.o. male who is being seen today for the evaluation of bradycardia, palpitations at the request of David Frizzle, MD. Presenting today for electrophysiology evaluation.  He has a history of Barrett's esophagus, CKD stage III, hypertension, hyperlipidemia.  His medications have been adjusted due to his CKD for blood pressure.  Multaq was stopped at the last visit and he was started on flecainide.  Today, denies symptoms of palpitations, chest pain, shortness of breath, orthopnea, PND, lower extremity edema, claudication, dizziness, presyncope, syncope, bleeding, or neurologic sequela. The patient is tolerating medications without difficulties.  Overall he is doing well.  He has noted no further episodes of atrial fibrillation.  His main complaint is dizziness.  He gets dizzy when he bends down and stands up again.  This mainly happens on the [place] when he bends down to pick up his ball.  Past Medical History:  Diagnosis Date  . Barrett's esophagus   . Cancer ([place])   . CKD (chronic kidney disease) stage 3, GFR 30-59 ml/min (HCC)   . Hyperlipidemia   . Hypertension   . Skin cancer    squamous cell of face   Past Surgical History:  Procedure Laterality Date  . CATARACT EXTRACTION, BILATERAL  2012  . KNEE ARTHROSCOPY Left 2010   torn meniscus  . SKIN CANCER EXCISION  2015, 2012  . UPPER GASTROINTESTINAL ENDOSCOPY     barrett's esophagus  . WISDOM TOOTH EXTRACTION  1980     Current Outpatient Medications  Medication Sig Dispense Refill  . acetaminophen (TYLENOL) 500 MG tablet Take 1,000 mg by mouth every 6 (six) hours as needed for  moderate pain.    Marland Kitchen apixaban (ELIQUIS) 2.5 MG TABS tablet Take 1 tablet (2.5 mg total) by mouth 2 (two) times daily. 180 tablet 3  . benazepril (LOTENSIN) 40 MG tablet Take 1 tablet (40 mg total) daily by mouth. 90 tablet 3  . finasteride (PROSCAR) 5 MG tablet Take 1 tablet (5 mg total) by mouth daily. 90 tablet 3  . flecainide (TAMBOCOR) 100 MG tablet Take 1 tablet (100 mg total) by mouth 2 (two) times daily. 180 tablet 0  . metoprolol succinate (TOPROL-XL) 25 MG 24 hr tablet Take 1 tablet (25 mg total) by mouth daily. Take with or immediately following a meal. 90 tablet 0  . Multiple Vitamins-Minerals (CENTRUM SILVER PO) Take 1 tablet by mouth daily.    . pantoprazole (PROTONIX) 40 MG tablet TAKE 1 TABLET DAILY 90 tablet 3  . simvastatin (ZOCOR) 10 MG tablet TAKE 1 TABLET AT BEDTIME 90 tablet 3  . hydrochlorothiazide (MICROZIDE) 12.5 MG capsule Take 1 capsule (12.5 mg total) by mouth daily. (Patient not taking: Reported on 04/18/2018) 30 capsule 0   No current facility-administered medications for this visit.     Allergies:   Patient has no known allergies.   Social History:  The patient  reports that he quit smoking about 51 years ago. He has never used smokeless tobacco. He reports that he does not drink alcohol or use drugs.   Family History:  The patient's family history includes Esophageal cancer in his  mother; Kidney cancer in his sister; Kidney disease in his brother; Other in his brother; Stroke in his father.   ROS:  Please see the history of present illness.   Otherwise, review of systems is positive for dizziness, easy bruising.   All other systems are reviewed and negative.   PHYSICAL EXAM: VS:  BP 140/68   Pulse (!) 48   Ht 6\' 1"  (1.854 m)   Wt 203 lb 9.6 oz (92.4 kg)   BMI 26.86 kg/m  , BMI Body mass index is 26.86 kg/m. GEN: Well nourished, well developed, in no acute distress  HEENT: normal  Neck: no JVD, carotid bruits, or masses Cardiac: RRR; no murmurs, rubs, or  gallops,no edema  Respiratory:  clear to auscultation bilaterally, normal work of breathing GI: soft, nontender, nondistended, + BS MS: no deformity or atrophy  Skin: warm and dry Neuro:  Strength and sensation are intact Psych: euthymic mood, full affect  EKG:  EKG is ordered today. Personal review of the ekg ordered shows sinus rhythm, rate 48  Recent Labs: 01/17/2018: TSH 3.500 03/17/2018: ALT 14; BUN 32; Creat 1.52; Hemoglobin 14.2; Platelets 194; Potassium 4.5; Sodium 138    Lipid Panel     Component Value Date/Time   CHOL 153 08/25/2017 0801   TRIG 196 (H) 08/25/2017 0801   HDL 37 (L) 08/25/2017 0801   CHOLHDL 4.1 08/25/2017 0801   VLDL 42 (H) 08/20/2016 1105   LDLCALC 87 08/25/2017 0801     Wt Readings from Last 3 Encounters:  04/18/18 203 lb 9.6 oz (92.4 kg)  03/17/18 203 lb (92.1 kg)  02/20/18 209 lb (94.8 kg)      Other studies Reviewed: Additional studies/ records that were reviewed today include: Epic notes   Holter monitor 01/05/18-personally reviewed Minimum heart rate 41 bpm at 2:25 AM Heart rate 144 bpm 6:05 PM Average heart rate 54 bpm Sinus rhythm with APCs and PVCs Wide-complex tachycardia, irregular, likely atrial fibrillation  ASSESSMENT AND PLAN:  1.  Hypertension: well controlled, no changes  2.  Hyperlipidemia: Continue Zocor  3. Paroxysmal atrial fibrillation: Early anticoagulated with Eliquis.  He was started on flecainide and Toprol-XL at the last visit.  He is having no further episodes of atrial fibrillation.  He is getting dizzy mainly when he stands up during the day.  I told him to take his Toprol-XL at night which may help with his dizziness.    This patients CHA2DS2-VASc Score and unadjusted Ischemic Stroke Rate (% per year) is equal to 3.2 % stroke rate/year from a score of 3  Above score calculated as 1 point each if present [CHF, HTN, DM, Vascular=MI/PAD/Aortic Plaque, Age if 65-74, or Male] Above score calculated as 2  points each if present [Age > 75, or Stroke/TIA/TE]    Current medicines are reviewed at length with the patient today.   The patient does not have concerns regarding his medicines.  The following changes were made today: None  Labs/ tests ordered today include:  Orders Placed This Encounter  Procedures  . EKG 12-Lead     Disposition:   FU with David Camnitz 6 months  Signed, David Meredith Leeds, MD  04/18/2018 8:44 AM     [facility] [address] [place] [address] [phone] (office) [phone] (fax)

## 2018-04-18 NOTE — Patient Instructions (Signed)
Medication Instructions:  Your physician recommends that you continue on your current medications as directed. Please refer to the Current Medication list given to you today.  Labwork: None ordered  Testing/Procedures: None ordered  Follow-Up: Your physician wants you to follow-up in: 6 months with Dr. Camnitz.  You will receive a reminder letter in the mail two months in advance. If you don't receive a letter, please call our office to schedule the follow-up appointment.  * If you need a refill on your cardiac medications before your next appointment, please call your pharmacy.   *Please note that any paperwork needing to be filled out by the provider will need to be addressed at the front desk prior to seeing the provider. Please note that any FMLA, disability or other documents regarding health condition is subject to a $25.00 charge that must be received prior to completion of paperwork in the form of a money order or check.  Thank you for choosing [facility]!!   Maybelline Kolarik, RN [phone]        

## 2018-04-21 DIAGNOSIS — Z85828 Personal history of other malignant neoplasm of skin: Secondary | ICD-10-CM | POA: Diagnosis not present

## 2018-04-21 DIAGNOSIS — L821 Other seborrheic keratosis: Secondary | ICD-10-CM | POA: Diagnosis not present

## 2018-04-21 DIAGNOSIS — L570 Actinic keratosis: Secondary | ICD-10-CM | POA: Diagnosis not present

## 2018-05-30 DIAGNOSIS — D485 Neoplasm of uncertain behavior of skin: Secondary | ICD-10-CM | POA: Diagnosis not present

## 2018-05-30 DIAGNOSIS — C44622 Squamous cell carcinoma of skin of right upper limb, including shoulder: Secondary | ICD-10-CM | POA: Diagnosis not present

## 2018-05-31 DIAGNOSIS — D485 Neoplasm of uncertain behavior of skin: Secondary | ICD-10-CM | POA: Diagnosis not present

## 2018-05-31 DIAGNOSIS — C44622 Squamous cell carcinoma of skin of right upper limb, including shoulder: Secondary | ICD-10-CM | POA: Diagnosis not present

## 2018-06-15 DIAGNOSIS — D0461 Carcinoma in situ of skin of right upper limb, including shoulder: Secondary | ICD-10-CM | POA: Diagnosis not present

## 2018-06-15 DIAGNOSIS — C44622 Squamous cell carcinoma of skin of right upper limb, including shoulder: Secondary | ICD-10-CM | POA: Diagnosis not present

## 2018-06-22 ENCOUNTER — Other Ambulatory Visit: Payer: Self-pay | Admitting: Cardiology

## 2018-07-19 DIAGNOSIS — L0100 Impetigo, unspecified: Secondary | ICD-10-CM | POA: Diagnosis not present

## 2018-07-19 DIAGNOSIS — T451X5D Adverse effect of antineoplastic and immunosuppressive drugs, subsequent encounter: Secondary | ICD-10-CM | POA: Diagnosis not present

## 2018-07-19 DIAGNOSIS — C44622 Squamous cell carcinoma of skin of right upper limb, including shoulder: Secondary | ICD-10-CM | POA: Diagnosis not present

## 2018-07-19 DIAGNOSIS — L0889 Other specified local infections of the skin and subcutaneous tissue: Secondary | ICD-10-CM | POA: Diagnosis not present

## 2018-08-30 ENCOUNTER — Ambulatory Visit (INDEPENDENT_AMBULATORY_CARE_PROVIDER_SITE_OTHER): Payer: Medicare Other | Admitting: Family Medicine

## 2018-08-30 DIAGNOSIS — Z23 Encounter for immunization: Secondary | ICD-10-CM | POA: Diagnosis not present

## 2018-09-18 ENCOUNTER — Other Ambulatory Visit: Payer: Self-pay | Admitting: Family Medicine

## 2018-09-22 ENCOUNTER — Other Ambulatory Visit: Payer: Medicare Other

## 2018-09-22 DIAGNOSIS — Z789 Other specified health status: Secondary | ICD-10-CM

## 2018-09-22 DIAGNOSIS — R7303 Prediabetes: Secondary | ICD-10-CM

## 2018-09-22 DIAGNOSIS — Z0000 Encounter for general adult medical examination without abnormal findings: Secondary | ICD-10-CM | POA: Diagnosis not present

## 2018-09-22 DIAGNOSIS — I10 Essential (primary) hypertension: Secondary | ICD-10-CM

## 2018-09-22 DIAGNOSIS — Z125 Encounter for screening for malignant neoplasm of prostate: Secondary | ICD-10-CM

## 2018-09-22 DIAGNOSIS — E7800 Pure hypercholesterolemia, unspecified: Secondary | ICD-10-CM

## 2018-09-22 DIAGNOSIS — IMO0001 Reserved for inherently not codable concepts without codable children: Secondary | ICD-10-CM

## 2018-09-23 LAB — CBC WITH DIFFERENTIAL/PLATELET
Basophils Absolute: 41 cells/uL (ref 0–200)
Basophils Relative: 0.7 %
EOS ABS: 153 {cells}/uL (ref 15–500)
Eosinophils Relative: 2.6 %
HCT: 40.1 % (ref 38.5–50.0)
HEMOGLOBIN: 13.5 g/dL (ref 13.2–17.1)
Lymphs Abs: 1080 cells/uL (ref 850–3900)
MCH: 30.3 pg (ref 27.0–33.0)
MCHC: 33.7 g/dL (ref 32.0–36.0)
MCV: 90.1 fL (ref 80.0–100.0)
MPV: 10.3 fL (ref 7.5–12.5)
Monocytes Relative: 7.4 %
NEUTROS ABS: 4189 {cells}/uL (ref 1500–7800)
Neutrophils Relative %: 71 %
Platelets: 181 10*3/uL (ref 140–400)
RBC: 4.45 10*6/uL (ref 4.20–5.80)
RDW: 12.8 % (ref 11.0–15.0)
Total Lymphocyte: 18.3 %
WBC: 5.9 10*3/uL (ref 3.8–10.8)
WBCMIX: 437 {cells}/uL (ref 200–950)

## 2018-09-23 LAB — COMPLETE METABOLIC PANEL WITH GFR
AG Ratio: 1.9 (calc) (ref 1.0–2.5)
ALBUMIN MSPROF: 4.1 g/dL (ref 3.6–5.1)
ALT: 18 U/L (ref 9–46)
AST: 19 U/L (ref 10–35)
Alkaline phosphatase (APISO): 57 U/L (ref 40–115)
BILIRUBIN TOTAL: 0.6 mg/dL (ref 0.2–1.2)
BUN/Creatinine Ratio: 12 (calc) (ref 6–22)
BUN: 16 mg/dL (ref 7–25)
CHLORIDE: 106 mmol/L (ref 98–110)
CO2: 28 mmol/L (ref 20–32)
Calcium: 9.4 mg/dL (ref 8.6–10.3)
Creat: 1.3 mg/dL — ABNORMAL HIGH (ref 0.70–1.11)
GFR, EST AFRICAN AMERICAN: 59 mL/min/{1.73_m2} — AB (ref >=60)
GFR, Est Non African American: 51 mL/min/{1.73_m2} — ABNORMAL LOW (ref >=60)
Globulin: 2.2 g/dL (calc) (ref 1.9–3.7)
Glucose, Bld: 96 mg/dL (ref 65–99)
POTASSIUM: 4.8 mmol/L (ref 3.5–5.3)
Sodium: 140 mmol/L (ref 135–146)
TOTAL PROTEIN: 6.3 g/dL (ref 6.1–8.1)

## 2018-09-23 LAB — PSA: PSA: 1.6 ng/mL (ref <=4.0)

## 2018-09-23 LAB — LIPID PANEL
Cholesterol: 155 mg/dL (ref <200)
HDL: 38 mg/dL — ABNORMAL LOW (ref >40)
LDL Cholesterol (Calc): 86 mg/dL (calc)
Non-HDL Cholesterol (Calc): 117 mg/dL (calc) (ref <130)
TRIGLYCERIDES: 214 mg/dL — AB (ref <150)
Total CHOL/HDL Ratio: 4.1 (calc) (ref <5.0)

## 2018-09-23 LAB — HEMOGLOBIN A1C
Hgb A1c MFr Bld: 5.6 % of total Hgb (ref <5.7)
Mean Plasma Glucose: 114 (calc)
eAG (mmol/L): 6.3 (calc)

## 2018-09-25 ENCOUNTER — Ambulatory Visit (INDEPENDENT_AMBULATORY_CARE_PROVIDER_SITE_OTHER): Payer: Medicare Other | Admitting: Family Medicine

## 2018-09-25 ENCOUNTER — Encounter: Payer: Self-pay | Admitting: Family Medicine

## 2018-09-25 VITALS — BP 138/78 | HR 50 | Temp 98.2°F | Resp 18 | Ht 73.0 in | Wt 209.0 lb

## 2018-09-25 DIAGNOSIS — Z0000 Encounter for general adult medical examination without abnormal findings: Secondary | ICD-10-CM | POA: Diagnosis not present

## 2018-09-25 DIAGNOSIS — I10 Essential (primary) hypertension: Secondary | ICD-10-CM | POA: Diagnosis not present

## 2018-09-25 DIAGNOSIS — E7800 Pure hypercholesterolemia, unspecified: Secondary | ICD-10-CM

## 2018-09-25 DIAGNOSIS — I480 Paroxysmal atrial fibrillation: Secondary | ICD-10-CM

## 2018-09-25 DIAGNOSIS — N183 Chronic kidney disease, stage 3 unspecified: Secondary | ICD-10-CM

## 2018-09-25 MED ORDER — ROSUVASTATIN CALCIUM 20 MG PO TABS: 20.0000 mg | ORAL_TABLET | Freq: Every day | ORAL | 3 refills | Status: DC

## 2018-09-25 NOTE — Progress Notes (Signed)
Subjective:    Patient ID: David Bush, male    DOB: 05/05/1934, 84 y.o.   MRN: [id number]  HPI Patient is here today for complete physical exam.  Patient is already had his flu shot in September.  He is still due for a tetanus shot.  His PSA has trended up from 2.9-3.1. Given his advanced age, I recommended against further prostate cancer screening as long as the patient is asymptomatic.  However, his PSA was inadvertently drawn prior to his office visit today.  PSA was within normal range at 1.6.  He has a history of Barrett's esophagus. He is followed by GI, Dr. Wilfrid Lund.  He is on Protonix for Barrett's esophagus.   He no longer requires a colonoscopy.  His most recent lab work is listed below and is significant for an HDL cholesterol of 38, triglycerides slightly elevated at greater than 200.  He has mild stage III chronic kidney disease but is creatinine and glomerular filtration rate have improved compared to last year.  His heart rate today on exam is in the low 50s.  However he is completely asymptomatic and he has an appointment to see his cardiologist in November.  I recommended that he monitor his heart rate and if consistently in the low 50s or symptomatic possibly cutting back on or discontinuing metoprolol.  But I have recommended that he discuss this with his cardiologist first. Lab on 09/22/2018  Component Date Value Ref Range Status  . WBC 09/22/2018 5.9  3.8 - 10.8 Thousand/uL Final  . RBC 09/22/2018 4.45  4.20 - 5.80 Million/uL Final  . Hemoglobin 09/22/2018 13.5  13.2 - 17.1 g/dL Final  . HCT 09/22/2018 40.1  38.5 - 50.0 % Final  . MCV 09/22/2018 90.1  80.0 - 100.0 fL Final  . MCH 09/22/2018 30.3  27.0 - 33.0 pg Final  . MCHC 09/22/2018 33.7  32.0 - 36.0 g/dL Final  . RDW 09/22/2018 12.8  11.0 - 15.0 % Final  . Platelets 09/22/2018 181  140 - 400 Thousand/uL Final  . MPV 09/22/2018 10.3  7.5 - 12.5 fL Final  . Neutro Abs 09/22/2018 4,189  1,500 - 7,800 cells/uL  Final  . Lymphs Abs 09/22/2018 1,080  850 - 3,900 cells/uL Final  . WBC mixed population 09/22/2018 437  200 - 950 cells/uL Final  . Eosinophils Absolute 09/22/2018 153  15 - 500 cells/uL Final  . Basophils Absolute 09/22/2018 41  0 - 200 cells/uL Final  . Neutrophils Relative % 09/22/2018 71  % Final  . Total Lymphocyte 09/22/2018 18.3  % Final  . Monocytes Relative 09/22/2018 7.4  % Final  . Eosinophils Relative 09/22/2018 2.6  % Final  . Basophils Relative 09/22/2018 0.7  % Final  . Glucose, Bld 09/22/2018 96  65 - 99 mg/dL Final   Comment: .            Fasting reference interval .   . BUN 09/22/2018 16  7 - 25 mg/dL Final  . Creat 09/22/2018 1.30* 0.70 - 1.11 mg/dL Final   Comment: For patients >85 years of age, the reference limit for Creatinine is approximately 13% higher for people identified as African-American. .   . GFR, Est Non African American 09/22/2018 51* > OR = 60 mL/min/1.31m2 Final  . GFR, Est African American 09/22/2018 59* > OR = 60 mL/min/1.73m2 Final  . BUN/Creatinine Ratio 09/22/2018 12  6 - 22 (calc) Final  . Sodium 09/22/2018 140  135 - 146  mmol/L Final  . Potassium 09/22/2018 4.8  3.5 - 5.3 mmol/L Final  . Chloride 09/22/2018 106  98 - 110 mmol/L Final  . CO2 09/22/2018 28  20 - 32 mmol/L Final  . Calcium 09/22/2018 9.4  8.6 - 10.3 mg/dL Final  . Total Protein 09/22/2018 6.3  6.1 - 8.1 g/dL Final  . Albumin 09/22/2018 4.1  3.6 - 5.1 g/dL Final  . Globulin 09/22/2018 2.2  1.9 - 3.7 g/dL (calc) Final  . AG Ratio 09/22/2018 1.9  1.0 - 2.5 (calc) Final  . Total Bilirubin 09/22/2018 0.6  0.2 - 1.2 mg/dL Final  . Alkaline phosphatase (APISO) 09/22/2018 57  40 - 115 U/L Final  . AST 09/22/2018 19  10 - 35 U/L Final  . ALT 09/22/2018 18  9 - 46 U/L Final  . Cholesterol 09/22/2018 155  <200 mg/dL Final  . HDL 09/22/2018 38* >40 mg/dL Final  . Triglycerides 09/22/2018 214* <150 mg/dL Final   Comment: . If a non-fasting specimen was collected, consider repeat  triglyceride testing on a fasting specimen if clinically indicated.  Yates Decamp et al. J. of Clin. Lipidol. 0347;4:259-563. .   . LDL Cholesterol (Calc) 09/22/2018 86  mg/dL (calc) Final   Comment: Reference range: <100 . Desirable range <100 mg/dL for primary prevention;   <70 mg/dL for patients with CHD or diabetic patients  with > or = 2 CHD risk factors. Marland Kitchen LDL-C is now calculated using the Martin-Hopkins  calculation, which is a validated novel method providing  better accuracy than the Friedewald equation in the  estimation of LDL-C.  Cresenciano Genre et al. Annamaria Helling. 8756;433(29): 2061-2068  (http://education.[facility].com/faq/FAQ164)   . Total CHOL/HDL Ratio 09/22/2018 4.1  <5.0 (calc) Final  . Non-HDL Cholesterol (Calc) 09/22/2018 117  <130 mg/dL (calc) Final   Comment: For patients with diabetes plus 1 major ASCVD risk  factor, treating to a non-HDL-C goal of <100 mg/dL  (LDL-C of <70 mg/dL) is considered a therapeutic  option.   . Hgb A1c MFr Bld 09/22/2018 5.6  <5.7 % of total Hgb Final   Comment: For the purpose of screening for the presence of diabetes: . <5.7%       Consistent with the absence of diabetes 5.7-6.4%    Consistent with increased risk for diabetes             (prediabetes) > or =6.5%  Consistent with diabetes . This assay result is consistent with a decreased risk of diabetes. . Currently, no consensus exists regarding use of hemoglobin A1c for diagnosis of diabetes in children. . According to American Diabetes Association (ADA) guidelines, hemoglobin A1c <7.0% represents optimal control in non-pregnant diabetic patients. Different metrics may apply to specific patient populations.  Standards of Medical Care in Diabetes(ADA). .   . Mean Plasma Glucose 09/22/2018 114  (calc) Final  . eAG (mmol/L) 09/22/2018 6.3  (calc) Final  . PSA 09/22/2018 1.6  < OR = 4.0 ng/mL Final   Comment: The total PSA value from this assay system is  standardized  against the WHO standard. The test  result will be approximately 20% lower when compared  to the equimolar-standardized total PSA (Beckman  Coulter). Comparison of serial PSA results should be  interpreted with this fact in mind. . This test was performed using the Siemens  chemiluminescent method. Values obtained from  different assay methods cannot be used interchangeably. PSA levels, regardless of value, should not be interpreted as absolute evidence of the presence or absence of  disease.     Past Medical History:  Diagnosis Date  . Barrett's esophagus   . Cancer ([place])   . CKD (chronic kidney disease) stage 3, GFR 30-59 ml/min (HCC)   . Hyperlipidemia   . Hypertension   . Skin cancer    squamous cell of face   Past Surgical History:  Procedure Laterality Date  . CATARACT EXTRACTION, BILATERAL  2012  . KNEE ARTHROSCOPY Left 2010   torn meniscus  . SKIN CANCER EXCISION  2015, 2012  . UPPER GASTROINTESTINAL ENDOSCOPY     barrett's esophagus  . WISDOM TOOTH EXTRACTION  1980   Current Outpatient Medications on File Prior to Visit  Medication Sig Dispense Refill  . acetaminophen (TYLENOL) 500 MG tablet Take 1,000 mg by mouth every 6 (six) hours as needed for moderate pain.    Marland Kitchen apixaban (ELIQUIS) 2.5 MG TABS tablet Take 1 tablet (2.5 mg total) by mouth 2 (two) times daily. 180 tablet 3  . benazepril (LOTENSIN) 40 MG tablet TAKE 1 TABLET DAILY 90 tablet 4  . finasteride (PROSCAR) 5 MG tablet Take 1 tablet (5 mg total) by mouth daily. 90 tablet 3  . flecainide (TAMBOCOR) 100 MG tablet TAKE 1 TABLET TWICE A DAY (STOP MULTAQ) 180 tablet 2  . hydrochlorothiazide (MICROZIDE) 12.5 MG capsule Take 1 capsule (12.5 mg total) by mouth daily. (Patient not taking: Reported on 04/18/2018) 30 capsule 0  . Multiple Vitamins-Minerals (CENTRUM SILVER PO) Take 1 tablet by mouth daily.    . pantoprazole (PROTONIX) 40 MG tablet TAKE 1 TABLET DAILY 90 tablet 3  . simvastatin (ZOCOR) 10 MG tablet  TAKE 1 TABLET AT BEDTIME 90 tablet 3  . TOPROL XL 25 MG 24 hr tablet TAKE 1 TABLET DAILY WITH OR IMMEDIATELY FOLLOWING A MEAL 90 tablet 2   No current facility-administered medications on file prior to visit.    No Known Allergies Social History   Socioeconomic History  . Marital status: Married    Spouse name: Not on file  . Number of children: Not on file  . Years of education: Not on file  . Highest education level: Not on file  Occupational History  . Not on file  Social Needs  . Financial resource strain: Not on file  . Food insecurity:    Worry: Not on file    Inability: Not on file  . Transportation needs:    Medical: Not on file    Non-medical: Not on file  Tobacco Use  . Smoking status: Former Smoker    Last attempt to quit: 12/06/1966    Years since quitting: 51.8  . Smokeless tobacco: Never Used  Substance and Sexual Activity  . 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  . Drug use: No  . Sexual activity: Not on file  Lifestyle  . Physical activity:    Days per week: Not on file    Minutes per session: Not on file  . Stress: Not on file  Relationships  . Social connections:    Talks on phone: Not on file    Gets together: Not on file    Attends religious service: Not on file    Active member of club or organization: Not on file    Attends meetings of clubs or organizations: Not on file    Relationship status: Not on file  . Intimate partner violence:    Fear of current or ex partner:  Not on file    Emotionally abused: Not on file    Physically abused: Not on file    Forced sexual activity: Not on file  Other Topics Concern  . Not on file  Social History Narrative  . Not on file   Family History  Problem Relation Age of Onset  . Esophageal cancer Mother   . Stroke Father   . Kidney cancer Sister   . Kidney disease Brother   . Other Brother   . Colon cancer Neg Hx       Review of  Systems  All other systems reviewed and are negative.      Objective:   Physical Exam  Constitutional: He is oriented to person, place, and time. He appears well-developed and well-nourished. No distress.  HENT:  Head: Normocephalic and atraumatic.  Right Ear: External ear normal.  Left Ear: External ear normal.  Nose: Nose normal.  Mouth/Throat: Oropharynx is clear and moist. No oropharyngeal exudate.  Eyes: Pupils are equal, round, and reactive to light. Conjunctivae and EOM are normal. Right eye exhibits no discharge. Left eye exhibits no discharge. No scleral icterus.  Neck: Normal range of motion. Neck supple. No JVD present. No tracheal deviation present. No thyromegaly present.  Cardiovascular: Normal rate, regular rhythm and intact distal pulses. Exam reveals no gallop and no friction rub.  Murmur heard. Pulmonary/Chest: Effort normal and breath sounds normal. No stridor. No respiratory distress. He has no wheezes. He has no rales. He exhibits no tenderness.  Abdominal: Soft. Bowel sounds are normal. He exhibits no distension and no mass. There is no tenderness. There is no rebound and no guarding.  Genitourinary: Rectum normal, prostate normal and penis normal.  Musculoskeletal: He exhibits no edema or deformity.  Lymphadenopathy:    He has no cervical adenopathy.  Neurological: He is alert and oriented to person, place, and time. He has normal reflexes. He displays normal reflexes. No cranial nerve deficit. He exhibits normal muscle tone. Coordination normal.  Skin: Skin is warm. No rash noted. He is not diaphoretic. No erythema. No pallor.  Psychiatric: He has a normal mood and affect. His behavior is normal. Judgment and thought content normal.  Vitals reviewed.         Assessment & Plan:  Routine general medical examination at a health care facility  CRI (chronic renal insufficiency), stage 3 (moderate) (HCC)  Benign essential HTN  Paroxysmal atrial fibrillation  (HCC)  Pure hypercholesterolemia  Patient's murmur is due to mild aortic stenosis.  He is asymptomatic.  I have recommended that he monitor his heart rate so that he can discuss this with his cardiologist in November.  If consistently in the 50s or particular if symptomatic, he may need to decrease his Toprol or discontinue altogether.  However I have recommended that he discuss this with his cardiologist first.  His chronic kidney disease is stable.  Given his dyslipidemia, I have recommended switching simvastatin to Crestor 20 mg a day in an effort to try to improve his lipid panel.  Recheck fasting lab work in 3 to 6 months.  He declines a tetanus shot today.  He declines a shingles shot today.  He is no longer due for colonoscopy.  The remainder of his preventative care is up-to-date

## 2018-10-06 DIAGNOSIS — C44629 Squamous cell carcinoma of skin of left upper limb, including shoulder: Secondary | ICD-10-CM | POA: Diagnosis not present

## 2018-10-06 DIAGNOSIS — C44329 Squamous cell carcinoma of skin of other parts of face: Secondary | ICD-10-CM | POA: Diagnosis not present

## 2018-10-06 DIAGNOSIS — Z85828 Personal history of other malignant neoplasm of skin: Secondary | ICD-10-CM | POA: Diagnosis not present

## 2018-10-06 DIAGNOSIS — D485 Neoplasm of uncertain behavior of skin: Secondary | ICD-10-CM | POA: Diagnosis not present

## 2018-10-06 DIAGNOSIS — Z23 Encounter for immunization: Secondary | ICD-10-CM | POA: Diagnosis not present

## 2018-10-06 DIAGNOSIS — L570 Actinic keratosis: Secondary | ICD-10-CM | POA: Diagnosis not present

## 2018-10-06 DIAGNOSIS — L821 Other seborrheic keratosis: Secondary | ICD-10-CM | POA: Diagnosis not present

## 2018-10-08 ENCOUNTER — Other Ambulatory Visit: Payer: Self-pay | Admitting: Family Medicine

## 2018-10-16 ENCOUNTER — Encounter: Payer: Self-pay | Admitting: Cardiology

## 2018-10-17 ENCOUNTER — Ambulatory Visit (INDEPENDENT_AMBULATORY_CARE_PROVIDER_SITE_OTHER): Payer: Medicare Other | Admitting: Cardiology

## 2018-10-17 ENCOUNTER — Ambulatory Visit: Payer: Medicare Other | Admitting: Cardiology

## 2018-10-17 ENCOUNTER — Encounter: Payer: Self-pay | Admitting: Cardiology

## 2018-10-17 VITALS — BP 134/78 | HR 58 | Ht 73.0 in | Wt 209.0 lb

## 2018-10-17 DIAGNOSIS — E785 Hyperlipidemia, unspecified: Secondary | ICD-10-CM | POA: Diagnosis not present

## 2018-10-17 DIAGNOSIS — I10 Essential (primary) hypertension: Secondary | ICD-10-CM

## 2018-10-17 DIAGNOSIS — I480 Paroxysmal atrial fibrillation: Secondary | ICD-10-CM

## 2018-10-17 NOTE — Patient Instructions (Signed)
Medication Instructions:  Your physician recommends that you continue on your current medications as directed. Please refer to the Current Medication list given to you today.  * If you need a refill on your cardiac medications before your next appointment, please call your pharmacy.   Labwork: None ordered  Testing/Procedures: None ordered  Follow-Up: Your physician wants you to follow-up in: 1 year with Dr. Camnitz.  You will receive a reminder letter in the mail two months in advance. If you don't receive a letter, please call our office to schedule the follow-up appointment.  Thank you for choosing [facility]!!   Gillian Meeuwsen, RN [phone]        

## 2018-10-17 NOTE — Progress Notes (Signed)
Electrophysiology Office Note   Date:  10/17/2018   ID:  David Bush, David Bush 1933/12/13, MRN [id number]  PCP:  David Frizzle, MD  Cardiologist:   Primary Electrophysiologist:   David Leeds, MD    No chief complaint on file.    History of Present Illness: David Bush is a 84 y.o. male who is being seen today for the evaluation of bradycardia, palpitations at the request of David Frizzle, MD. Presenting today for electrophysiology evaluation.  He has a history of Barrett's esophagus, CKD stage III, hypertension, hyperlipidemia.  His medications have been adjusted due to his CKD for blood pressure.  Multaq was stopped at the last visit and he was started on flecainide.  Today, denies symptoms of palpitations, chest pain, shortness of breath, orthopnea, PND, lower extremity edema, claudication, dizziness, presyncope, syncope, bleeding, or neurologic sequela. The patient is tolerating medications without difficulties.  He continues to feel well.  He has noted no further episodes of atrial fibrillation.  Past Medical History:  Diagnosis Date  . Barrett's esophagus   . Cancer ([place])   . CKD (chronic kidney disease) stage 3, GFR 30-59 ml/min (HCC)   . Hyperlipidemia   . Hypertension   . Skin cancer    squamous cell of face   Past Surgical History:  Procedure Laterality Date  . CATARACT EXTRACTION, BILATERAL  2012  . KNEE ARTHROSCOPY Left 2010   torn meniscus  . SKIN CANCER EXCISION  2015, 2012  . UPPER GASTROINTESTINAL ENDOSCOPY     barrett's esophagus  . WISDOM TOOTH EXTRACTION  1980     Current Outpatient Medications  Medication Sig Dispense Refill  . acetaminophen (TYLENOL) 500 MG tablet Take 1,000 mg by mouth every 6 (six) hours as needed for moderate pain.    Marland Kitchen apixaban (ELIQUIS) 2.5 MG TABS tablet Take 1 tablet (2.5 mg total) by mouth 2 (two) times daily. 180 tablet 3  . benazepril (LOTENSIN) 40 MG tablet TAKE 1 TABLET DAILY 90 tablet 4  .  finasteride (PROSCAR) 5 MG tablet Take 1 tablet (5 mg total) by mouth daily. 90 tablet 3  . flecainide (TAMBOCOR) 100 MG tablet TAKE 1 TABLET TWICE A DAY (STOP MULTAQ) 180 tablet 2  . Multiple Vitamins-Minerals (CENTRUM SILVER PO) Take 1 tablet by mouth daily.    . pantoprazole (PROTONIX) 40 MG tablet TAKE 1 TABLET DAILY 90 tablet 4  . rosuvastatin (CRESTOR) 20 MG tablet Take 1 tablet (20 mg total) by mouth daily. 90 tablet 3  . TOPROL XL 25 MG 24 hr tablet TAKE 1 TABLET DAILY WITH OR IMMEDIATELY FOLLOWING A MEAL 90 tablet 2   No current facility-administered medications for this visit.     Allergies:   Patient has no known allergies.   Social History:  The patient  reports that he quit smoking about 51 years ago. He has never used smokeless tobacco. He reports that he does not drink alcohol or use drugs.   Family History:  The patient's family history includes Esophageal cancer in his mother; Kidney cancer in his sister; Kidney disease in his brother; Other in his brother; Stroke in his father.   ROS:  Please see the history of present illness.   Otherwise, review of systems is positive for none.   All other systems are reviewed and negative.   PHYSICAL EXAM: VS:  BP 134/78   Pulse (!) 58   Ht 6\' 1"  (1.854 m)   Wt 209 lb (94.8 kg)  BMI 27.57 kg/m  , BMI Body mass index is 27.57 kg/m. GEN: Well nourished, well developed, in no acute distress  HEENT: normal  Neck: no JVD, carotid bruits, or masses Cardiac: RRR; no murmurs, rubs, or gallops,no edema  Respiratory:  clear to auscultation bilaterally, normal work of breathing GI: soft, nontender, nondistended, + BS MS: no deformity or atrophy  Skin: warm and dry Neuro:  Strength and sensation are intact Psych: euthymic mood, full affect  EKG:  EKG is ordered today. Personal review of the ekg ordered shows sinus rhythm, rate 58, first-degree AV block  Recent Labs: 01/17/2018: TSH 3.500 09/22/2018: ALT 18; BUN 16; Creat 1.30;  Hemoglobin 13.5; Platelets 181; Potassium 4.8; Sodium 140    Lipid Panel     Component Value Date/Time   CHOL 155 09/22/2018 0807   TRIG 214 (H) 09/22/2018 0807   HDL 38 (L) 09/22/2018 0807   CHOLHDL 4.1 09/22/2018 0807   VLDL 42 (H) 08/20/2016 1105   LDLCALC 86 09/22/2018 0807     Wt Readings from Last 3 Encounters:  10/17/18 209 lb (94.8 kg)  09/25/18 209 lb (94.8 kg)  04/18/18 203 lb 9.6 oz (92.4 kg)      Other studies Reviewed: Additional studies/ records that were reviewed today include: Epic notes   Holter monitor 01/05/18-personally reviewed Minimum heart rate 41 bpm at 2:25 AM Heart rate 144 bpm 6:05 PM Average heart rate 54 bpm Sinus rhythm with APCs and PVCs Wide-complex tachycardia, irregular, likely atrial fibrillation  ASSESSMENT AND PLAN:  1.  Hypertension: Mildly elevated today.  Has been more normal in the past and is normal at home.  No changes.  2.  Hyperlipidemia: Continue Zocor  3. Paroxysmal atrial fibrillation: Anticoagulated with Eliquis, on Toprol-XL and flecainide.  He is tolerating these well.  He is noted no further episodes of atrial fibrillation.  No changes.  This patients CHA2DS2-VASc Score and unadjusted Ischemic Stroke Rate (% per year) is equal to 3.2 % stroke rate/year from a score of 3  Above score calculated as 1 point each if present [CHF, HTN, DM, Vascular=MI/PAD/Aortic Plaque, Age if 65-74, or Male] Above score calculated as 2 points each if present [Age > 75, or Stroke/TIA/TE]    Current medicines are reviewed at length with the patient today.   The patient does not have concerns regarding his medicines.  The following changes were made today: None  Labs/ tests ordered today include:  Orders Placed This Encounter  Procedures  . EKG 12-Lead     Disposition:   FU with   12 months  Signed,  David Leeds, MD  10/17/2018 4:16 PM     [address] [place]  [id number] [phone] (office) [phone] (fax)

## 2018-11-07 ENCOUNTER — Encounter: Payer: Self-pay | Admitting: Family Medicine

## 2018-11-07 DIAGNOSIS — C44329 Squamous cell carcinoma of skin of other parts of face: Secondary | ICD-10-CM | POA: Diagnosis not present

## 2018-11-07 DIAGNOSIS — C44629 Squamous cell carcinoma of skin of left upper limb, including shoulder: Secondary | ICD-10-CM | POA: Diagnosis not present

## 2018-11-22 DIAGNOSIS — H35033 Hypertensive retinopathy, bilateral: Secondary | ICD-10-CM | POA: Diagnosis not present

## 2018-11-22 DIAGNOSIS — H26493 Other secondary cataract, bilateral: Secondary | ICD-10-CM | POA: Diagnosis not present

## 2018-11-22 DIAGNOSIS — Z961 Presence of intraocular lens: Secondary | ICD-10-CM | POA: Diagnosis not present

## 2018-12-08 DIAGNOSIS — L570 Actinic keratosis: Secondary | ICD-10-CM | POA: Diagnosis not present

## 2018-12-08 DIAGNOSIS — L302 Cutaneous autosensitization: Secondary | ICD-10-CM | POA: Diagnosis not present

## 2019-01-05 DIAGNOSIS — L300 Nummular dermatitis: Secondary | ICD-10-CM | POA: Diagnosis not present

## 2019-01-05 DIAGNOSIS — Z23 Encounter for immunization: Secondary | ICD-10-CM | POA: Diagnosis not present

## 2019-01-05 DIAGNOSIS — C44629 Squamous cell carcinoma of skin of left upper limb, including shoulder: Secondary | ICD-10-CM | POA: Diagnosis not present

## 2019-01-05 DIAGNOSIS — L570 Actinic keratosis: Secondary | ICD-10-CM | POA: Diagnosis not present

## 2019-01-05 DIAGNOSIS — D485 Neoplasm of uncertain behavior of skin: Secondary | ICD-10-CM | POA: Diagnosis not present

## 2019-01-11 ENCOUNTER — Encounter: Payer: Self-pay | Admitting: Family Medicine

## 2019-01-11 ENCOUNTER — Emergency Department (HOSPITAL_COMMUNITY): Payer: Medicare Other

## 2019-01-11 ENCOUNTER — Other Ambulatory Visit: Payer: Self-pay

## 2019-01-11 ENCOUNTER — Telehealth: Payer: Self-pay | Admitting: Cardiology

## 2019-01-11 ENCOUNTER — Ambulatory Visit (INDEPENDENT_AMBULATORY_CARE_PROVIDER_SITE_OTHER): Payer: Medicare Other | Admitting: Family Medicine

## 2019-01-11 ENCOUNTER — Emergency Department (HOSPITAL_COMMUNITY)
Admission: EM | Admit: 2019-01-11 | Discharge: 2019-01-11 | Disposition: A | Discharge status: Home / Self Care | Payer: Medicare Other | Attending: Emergency Medicine | Admitting: Emergency Medicine

## 2019-01-11 ENCOUNTER — Encounter (HOSPITAL_COMMUNITY): Payer: Self-pay | Admitting: Emergency Medicine

## 2019-01-11 VITALS — BP 160/80 | HR 57 | Temp 97.8°F | Resp 16 | Ht 73.0 in | Wt 210.4 lb

## 2019-01-11 DIAGNOSIS — Z87891 Personal history of nicotine dependence: Secondary | ICD-10-CM | POA: Insufficient documentation

## 2019-01-11 DIAGNOSIS — Z7901 Long term (current) use of anticoagulants: Secondary | ICD-10-CM | POA: Insufficient documentation

## 2019-01-11 DIAGNOSIS — I129 Hypertensive chronic kidney disease with stage 1 through stage 4 chronic kidney disease, or unspecified chronic kidney disease: Secondary | ICD-10-CM | POA: Insufficient documentation

## 2019-01-11 DIAGNOSIS — N183 Chronic kidney disease, stage 3 (moderate): Secondary | ICD-10-CM | POA: Diagnosis not present

## 2019-01-11 DIAGNOSIS — Z79899 Other long term (current) drug therapy: Secondary | ICD-10-CM | POA: Insufficient documentation

## 2019-01-11 DIAGNOSIS — R079 Chest pain, unspecified: Secondary | ICD-10-CM

## 2019-01-11 DIAGNOSIS — R0789 Other chest pain: Secondary | ICD-10-CM | POA: Diagnosis not present

## 2019-01-11 LAB — BASIC METABOLIC PANEL
Anion gap: 6 (ref 5–15)
BUN: 33 mg/dL — ABNORMAL HIGH (ref 8–23)
CO2: 25 mmol/L (ref 22–32)
Calcium: 9 mg/dL (ref 8.9–10.3)
Chloride: 104 mmol/L (ref 98–111)
Creatinine, Ser: 1.36 mg/dL — ABNORMAL HIGH (ref 0.61–1.24)
GFR calc non Af Amer: 48 mL/min — ABNORMAL LOW (ref >60)
GFR, EST AFRICAN AMERICAN: 56 mL/min — AB (ref >60)
Glucose, Bld: 111 mg/dL — ABNORMAL HIGH (ref 70–99)
Potassium: 4 mmol/L (ref 3.5–5.1)
SODIUM: 135 mmol/L (ref 135–145)

## 2019-01-11 LAB — CBC
HCT: 42.5 % (ref 39.0–52.0)
Hemoglobin: 14 g/dL (ref 13.0–17.0)
MCH: 30.7 pg (ref 26.0–34.0)
MCHC: 32.9 g/dL (ref 30.0–36.0)
MCV: 93.2 fL (ref 80.0–100.0)
PLATELETS: 184 10*3/uL (ref 150–400)
RBC: 4.56 MIL/uL (ref 4.22–5.81)
RDW: 12.2 % (ref 11.5–15.5)
WBC: 12 10*3/uL — ABNORMAL HIGH (ref 4.0–10.5)
nRBC: 0 % (ref 0.0–0.2)

## 2019-01-11 LAB — TROPONIN I

## 2019-01-11 MED ORDER — ASPIRIN 81 MG PO CHEW: 324.0000 mg | CHEWABLE_TABLET | Freq: Once | ORAL | Status: DC

## 2019-01-11 NOTE — Progress Notes (Addendum)
Patient ID: David Bush, male    DOB: 06/17/1934, 85 y.o.   MRN: [id number]  PCP: [name], MD  Chief Complaint  Patient presents with  . Hypertension    Patient in today with c/o elevated blood pressure and chest pains.States BP was 189/98.    Subjective:   David Bush is a 85 y.o. male, presents to clinic with CC of CP onset this am 4:15, 6 hours ago.  Substernal "achy" CP w/o radiation, either woke him up at 4:15 am or he woke up and noticed it, lasted 1 hour, he checked his BP was elevated to 189/98, did nothing else, no other alleviating or aggravating factors, he just say in bed and checked BP a few times, he was about to call 911 or go to the ER but after 1 hour it resolved.  He had change to pain with positional changes, with breathing.  No associated sx.  He's never had CP before.  He was very alarmed but thought to himself that because it did not radiate to his arm or his jaw and it eventually resolved that it was okay.  He has a history of A. fib treated by EP, last visit November 2019.  States he is never had any cardiac procedures or invasive procedures or testing.  He reports a stress test done maybe 2 years ago that was negative.  ECHO from 01/23/18 reviewed, gated spect myo and exercise tolerance test reviewed 02/20/18 and  02/10/18. Mild AV stenosis, left atrium mildly- moderately dilated, LVEF 55-60% no RWMA Stress test - Low risk stress nuclear study with normal perfusion and normal left ventricular regional and global systolic function.    Hypertension  This is a chronic problem. The current episode started today. The problem has been rapidly worsening since onset. Associated symptoms include chest pain. Pertinent negatives include no anxiety, blurred vision, headaches, malaise/fatigue, neck pain, orthopnea, palpitations, peripheral edema, PND, shortness of breath or sweats. Agents associated with hypertension include steroids. Risk factors for coronary  artery disease include dyslipidemia, family history and male gender. Past treatments include diuretics, beta blockers and ACE inhibitors. There are no compliance problems.  Hypertensive end-organ damage includes angina and kidney disease. There is no history of CAD/MI, CVA, heart failure, left ventricular hypertrophy, PVD or retinopathy.  Chest Pain   This is a new problem. The current episode started today. The onset quality is sudden. The problem has been resolved. The pain is present in the substernal region. The pain is at a severity of 5/10. Quality: dull ache. The pain does not radiate. Pertinent negatives include no abdominal pain, back pain, claudication, cough, diaphoresis, dizziness, exertional chest pressure, fever, headaches, irregular heartbeat, leg pain, lower extremity edema, malaise/fatigue, nausea, near-syncope, numbness, orthopnea, palpitations, PND, shortness of breath, sputum production, syncope, vomiting or weakness. The pain is aggravated by nothing. He has tried nothing for the symptoms. Risk factors include male gender.  His past medical history is significant for arrhythmia, hyperlipidemia, hypertension and valve disorder.  Pertinent negatives for past medical history include no MI and no PVD.  His family medical history is significant for CAD and heart disease.    On meds - Eliquis, flecanide, metoprolol, benazepril, HCTZ - takes all in am  Went to dermatology last week, put on prednisone, denies weight gain, swelling  BP this am high 189/98, gradually went down to 170/92  BP normally is around 120-130/60-70, but Dr. Dennard Schaumann recently changed a medicine recently pt states BP  increased to about 140 (looking through med list only change was simvastatin to crestor)  Pt denies any change to his eating in the past week.  EtOH quit 28 years ago alcoholic Former smoker quit many decades ago  Wt Readings from Last 5 Encounters:  01/11/19 210 lb 6 oz (95.4 kg)  10/17/18 209 lb  (94.8 kg)  09/25/18 209 lb (94.8 kg)  04/18/18 203 lb 9.6 oz (92.4 kg)  03/17/18 203 lb (92.1 kg)   No acute weight changes in the past couple months.   He denies headache visual disturbances numbness tingling confusion shortness of breath abdominal pain nausea vomiting diaphoresis palpitations near syncope, fatigue.  No recent illness.  Currently no chest pain  Has a history of Barrett's esophagus, does take Protonix, denies any abdominal pain, dyspepsia or worsening reflux lately     Patient Active Problem List   Diagnosis Date Noted  . Barrett's esophagus 09/21/2016  . HTN (hypertension) 09/21/2016  . HLD (hyperlipidemia) 09/21/2016  . Cataracts, both eyes 09/21/2016     Prior to Admission medications   Medication Sig Start Date End Date Taking? Authorizing Provider  acetaminophen (TYLENOL) 500 MG tablet Take 1,000 mg by mouth every 6 (six) hours as needed for moderate pain.   Yes [provider]  apixaban (ELIQUIS) 2.5 MG TABS tablet Take 1 tablet (2.5 mg total) by mouth 2 (two) times daily. 02/16/18  Yes Camnitz, Will Hassell Done, MD  benazepril (LOTENSIN) 40 MG tablet TAKE 1 TABLET DAILY 09/18/18  Yes [name], MD  finasteride (PROSCAR) 5 MG tablet Take 1 tablet (5 mg total) by mouth daily. 02/09/18  Yes [name], MD  flecainide (TAMBOCOR) 100 MG tablet TAKE 1 TABLET TWICE A DAY (STOP MULTAQ) 06/22/18  Yes Camnitz, Will Hassell Done, MD  fluocinonide ointment (LIDEX) 0.05 % APP AA BID 01/05/19  Yes [provider]  Multiple Vitamins-Minerals (CENTRUM SILVER PO) Take 1 tablet by mouth daily.   Yes [provider]  pantoprazole (PROTONIX) 40 MG tablet TAKE 1 TABLET DAILY 10/09/18  Yes [name], MD  predniSONE (DELTASONE) 10 MG tablet  01/05/19  Yes [provider]  rosuvastatin (CRESTOR) 20 MG tablet Take 1 tablet (20 mg total) by mouth daily. 09/25/18  Yes [name], MD  TOPROL XL 25 MG 24 hr tablet TAKE 1 TABLET DAILY WITH  OR IMMEDIATELY FOLLOWING A MEAL 06/22/18  Yes Camnitz, Ocie Doyne, MD     No Known Allergies   Family History  Problem Relation Age of Onset  . Esophageal cancer Mother   . Stroke Father   . Kidney cancer Sister   . Kidney disease Brother   . Other Brother   . Colon cancer Neg Hx      Social History   Socioeconomic History  . Marital status: Married    Spouse name: Not on file  . Number of children: Not on file  . Years of education: Not on file  . Highest education level: Not on file  Occupational History  . Not on file  Social Needs  . Financial resource strain: Not on file  . Food insecurity:    Worry: Not on file    Inability: Not on file  . Transportation needs:    Medical: Not on file    Non-medical: Not on file  Tobacco Use  . Smoking status: Former Smoker    Last attempt to quit: 12/06/1966    Years since quitting: 52.1  . Smokeless tobacco:  Never Used  Substance and Sexual Activity  . 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  . Drug use: No  . Sexual activity: Not on file  Lifestyle  . Physical activity:    Days per week: Not on file    Minutes per session: Not on file  . Stress: Not on file  Relationships  . Social connections:    Talks on phone: Not on file    Gets together: Not on file    Attends religious service: Not on file    Active member of club or organization: Not on file    Attends meetings of clubs or organizations: Not on file    Relationship status: Not on file  . Intimate partner violence:    Fear of current or ex partner: Not on file    Emotionally abused: Not on file    Physically abused: Not on file    Forced sexual activity: Not on file  Other Topics Concern  . Not on file  Social History Narrative  . Not on file     Review of Systems  Constitutional: Negative.  Negative for diaphoresis, fever and malaise/fatigue.  HENT: Negative.   Eyes: Negative.  Negative  for blurred vision.  Respiratory: Negative.  Negative for cough, sputum production and shortness of breath.   Cardiovascular: Positive for chest pain. Negative for palpitations, orthopnea, claudication, syncope, PND and near-syncope.  Gastrointestinal: Negative.  Negative for abdominal pain, nausea and vomiting.  Endocrine: Negative.   Genitourinary: Negative.   Musculoskeletal: Negative.  Negative for back pain and neck pain.  Skin: Negative.   Allergic/Immunologic: Negative.   Neurological: Negative.  Negative for dizziness, weakness, numbness and headaches.  Hematological: Negative.   Psychiatric/Behavioral: Negative.   All other systems reviewed and are negative.      Objective:    Vitals:   01/11/19 0955  BP: (!) 160/80  Pulse: (!) 57  Resp: 16  Temp: 97.8 F (36.6 C)  TempSrc: Oral  SpO2: 98%  Weight: 210 lb 6 oz (95.4 kg)  Height: 6\' 1"  (1.854 m)      Physical Exam Constitutional:      General: He is not in acute distress.    Appearance: Normal appearance. He is well-developed. He is not ill-appearing or toxic-appearing.  HENT:     Head: Normocephalic and atraumatic.     Jaw: No trismus.     Right Ear: Tympanic membrane, ear canal and external ear normal.     Left Ear: Tympanic membrane, ear canal and external ear normal.     Nose: Nose normal. No mucosal edema or rhinorrhea.     Right Sinus: No maxillary sinus tenderness or frontal sinus tenderness.     Left Sinus: No maxillary sinus tenderness or frontal sinus tenderness.     Mouth/Throat:     Mouth: Mucous membranes are moist.     Pharynx: Oropharynx is clear. Uvula midline. No oropharyngeal exudate, posterior oropharyngeal erythema or uvula swelling.  Eyes:     General: Lids are normal. No scleral icterus.    Conjunctiva/sclera: Conjunctivae normal.     Pupils: Pupils are equal, round, and reactive to light.  Neck:     Musculoskeletal: Normal range of motion and neck supple.     Vascular: No carotid  bruit or JVD.     Trachea: Trachea and phonation normal. No tracheal deviation.  Cardiovascular:     Rate and Rhythm:  Regular rhythm. Bradycardia present.  No extrasystoles are present.    Chest Wall: PMI is not displaced.     Pulses: Normal pulses.          Radial pulses are 2+ on the right side and 2+ on the left side.       Posterior tibial pulses are 2+ on the right side and 2+ on the left side.     Heart sounds: Murmur present. Systolic (heard best at aortic area) murmur present with a grade of 2/6. No friction rub. No gallop.   Pulmonary:     Effort: Pulmonary effort is normal. No respiratory distress.     Breath sounds: Normal breath sounds. No stridor. No wheezing, rhonchi or rales.  Chest:     Chest wall: No tenderness.  Abdominal:     General: Bowel sounds are normal. There is no distension.     Palpations: Abdomen is soft. There is no mass.     Tenderness: There is no abdominal tenderness. There is no guarding or rebound.  Musculoskeletal: Normal range of motion.     Right lower leg: No edema.     Left lower leg: No edema.  Skin:    General: Skin is warm and dry.     Capillary Refill: Capillary refill takes less than 2 seconds.     Findings: No rash.  Neurological:     Mental Status: He is alert and oriented to person, place, and time.     Gait: Gait normal.  Psychiatric:        Speech: Speech normal.        Behavior: Behavior normal.     EKG:  Done at 10:05, reviewed at 10:08 am -LT, NSTEMI Rate 55, sinus bradycardia, V3 R wave and T wave abnormal, but no contiguous J - point elevation, ST elevation or depression or reciprocal changes.   Subtle changes from most recent EKG in chart from 10/17/18, likely due to position of leads  324 mg ASA given at 10:03 am    Assessment & Plan:      ICD-10-CM   1. Chest pain, unspecified type R07.9 EKG 12-Lead    Hx of early morning waking? Central substernal chest pain without radiation starting at 415 this morning lasting  for 1 hour, rated 5 out of 10, no alleviating or aggravating factors, described as aching, no associated symptoms but he did take his blood pressure and it was elevated much higher than his baseline.  He has a history of A. fib, Barrett's esophagus, stress test 2 years ago were unremarkable.  Presentation concerning for ACS I do feel he will need to be ruled out with cardiac markers, he does not having chest pain EKG is reassuring, BP is still mildly elevated Elitek to his baseline but no hypertensive urgency.  Differential includes CAD, demand ischemia, certainly could be some CP relative to Barrett's esophagus/esophagitis secondary to prednisone use.  The location, duration and onset with pt's age and risk factors was concerning enough to me that I gave 324 ASA, did EKG.  On exam no signs of CHF, he appears euvolemic, sinus brady with systolic murmur - which is his baseline feel risk is high enough that he needs serial troponins - sent to [facility] ER.  Advised pt to call 911 if any CP reoccurs.  He is going home to have wife drive him to ER.  LPN called report.    Delsa Grana, PA-C 01/11/19 10:09 AM

## 2019-01-11 NOTE — Discharge Instructions (Signed)
Your testing today did not reveal any specific findings You should see your cardiologist in the next week - see phone number above for local cardiology or you can seen Dr. Curt Bears preferably.    If you should have increased pain, shortness of breath or any other severe or worsening symptoms, return to the ER immediately.

## 2019-01-11 NOTE — Addendum Note (Signed)
Addended by: Launa Grill on: 01/11/2019 10:43 AM   Modules accepted: Orders

## 2019-01-11 NOTE — ED Provider Notes (Signed)
[facility] EMERGENCY DEPARTMENT Provider Note   CSN: [id number] Arrival date & time: 01/11/19  1154     History   Chief Complaint Chief Complaint  Patient presents with  . Chest Pain    HPI David Bush is a 85 y.o. male.  HPI  The patient is an 85 year old male, he has a history of high blood pressure, mild hyperlipidemia and has had a history of skin cancer, he was diagnosed with atrial fibrillation approximately 1-1/2 years ago during which time he was seen by the cardiologist and tested with some evaluation including an echocardiogram in February 2019 showing a 55 to 60% ejection fraction with normal wall motion and only grade 1 diastolic dysfunction.  He also had a myocardial perfusion exercise stress which was read as a normal study which was low risk.  He has not had any chest pain or difficulty since that time and follows regularly with his doctor taking his Eliquis, his flecainide and beta-blockers.  He was in his usual state of health until approximately 4:00 this morning when he awoke to use the bathroom and noted that he was having some chest pain.  At this time he cannot give me a description of what it felt like but it was uncomfortable for approximately 1 hour but resolved at 5:00.  He made an appointment with his doctor's office where he was seen by the physician assistant who sent him to the emergency department for further evaluation.  The patient has been pain-free for the last 7-1/2 hours.  He had no radiation of the pain, there is no associated shortness of breath nausea or diaphoresis and he denies any swelling of the legs.  In fact the patient endorses going to the gym 3 times a week to walk on the treadmill and to do weights, at no time does he ever have symptoms like this with exertion.  There is been no coughing no fevers or swelling of the legs.  Past Medical History:  Diagnosis Date  . Barrett's esophagus   . Cancer ([place])   . CKD (chronic kidney  disease) stage 3, GFR 30-59 ml/min (HCC)   . Hyperlipidemia   . Hypertension   . Skin cancer    squamous cell of face    Patient Active Problem List   Diagnosis Date Noted  . Barrett's esophagus 09/21/2016  . HTN (hypertension) 09/21/2016  . HLD (hyperlipidemia) 09/21/2016  . Cataracts, both eyes 09/21/2016    Past Surgical History:  Procedure Laterality Date  . CATARACT EXTRACTION, BILATERAL  2012  . KNEE ARTHROSCOPY Left 2010   torn meniscus  . SKIN CANCER EXCISION  2015, 2012  . UPPER GASTROINTESTINAL ENDOSCOPY     barrett's esophagus  . [place] Medications    Prior to Admission medications   Medication Sig Start Date End Date Taking? Authorizing Provider  acetaminophen (TYLENOL) 500 MG tablet Take 1,000 mg by mouth every 6 (six) hours as needed for moderate pain.    [provider]  apixaban (ELIQUIS) 2.5 MG TABS tablet Take 1 tablet (2.5 mg total) by mouth 2 (two) times daily. 02/16/18   Camnitz, Will Hassell Done, MD  benazepril (LOTENSIN) 40 MG tablet TAKE 1 TABLET DAILY 09/18/18   [name], MD  finasteride (PROSCAR) 5 MG tablet Take 1 tablet (5 mg total) by mouth daily. 02/09/18   [name], MD  flecainide (TAMBOCOR) 100 MG tablet TAKE 1 TABLET  TWICE A DAY (STOP MULTAQ) 06/22/18   Camnitz, Ocie Doyne, MD  fluocinonide ointment (LIDEX) 0.05 % APP AA BID 01/05/19   [provider]  Multiple Vitamins-Minerals (CENTRUM SILVER PO) Take 1 tablet by mouth daily.    [provider]  pantoprazole (PROTONIX) 40 MG tablet TAKE 1 TABLET DAILY 10/09/18   [name], MD  predniSONE (DELTASONE) 10 MG tablet  01/05/19   [provider]  rosuvastatin (CRESTOR) 20 MG tablet Take 1 tablet (20 mg total) by mouth daily. 09/25/18   [name], MD  TOPROL XL 25 MG 24 hr tablet TAKE 1 TABLET DAILY WITH OR IMMEDIATELY FOLLOWING A MEAL 06/22/18   Camnitz, Ocie Doyne, MD    Family History Family  History  Problem Relation Age of Onset  . Esophageal cancer Mother   . Stroke Father   . Kidney cancer Sister   . Kidney disease Brother   . Other Brother   . Colon cancer Neg Hx     Social History Social History   Tobacco Use  . Smoking status: Former Smoker    Last attempt to quit: 12/06/1966    Years since quitting: 52.1  . Smokeless tobacco: Never Used  Substance Use Topics  . 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  . Drug use: No     Allergies   Patient has no known allergies.   Review of Systems Review of Systems  All other systems reviewed and are negative.    Physical Exam Updated Vital Signs BP 127/71   Pulse (!) 59   Temp 97.7 F (36.5 C) (Oral)   Resp 19   Ht 1.854 m (6\' 1" )   Wt 95 kg   SpO2 97%   BMI 27.63 kg/m   Physical Exam Vitals signs and nursing note reviewed.  Constitutional:      General: He is not in acute distress.    Appearance: He is well-developed.  HENT:     Head: Normocephalic and atraumatic.     Mouth/Throat:     Pharynx: No oropharyngeal exudate.  Eyes:     General: No scleral icterus.       Right eye: No discharge.        Left eye: No discharge.     Conjunctiva/sclera: Conjunctivae normal.     Pupils: Pupils are equal, round, and reactive to light.  Neck:     Musculoskeletal: Normal range of motion and neck supple.     Thyroid: No thyromegaly.     Vascular: No JVD.  Cardiovascular:     Rate and Rhythm: Regular rhythm. Bradycardia present.     Heart sounds: Murmur ( soft systolic murmur) present. No friction rub. No gallop.      Comments: Pulse of 55 Pulmonary:     Effort: Pulmonary effort is normal. No respiratory distress.     Breath sounds: Normal breath sounds. No wheezing or rales.  Abdominal:     General: Bowel sounds are normal. There is no distension.     Palpations: Abdomen is soft. There is no mass.     Tenderness: There is no abdominal  tenderness.  Musculoskeletal: Normal range of motion.        General: No tenderness.  Lymphadenopathy:     Cervical: No cervical adenopathy.  Skin:    General: Skin is warm and dry.     Findings: No erythema or rash.  Neurological:  Mental Status: He is alert.     Coordination: Coordination normal.  Psychiatric:        Behavior: Behavior normal.      ED Treatments / Results  Labs (all labs ordered are listed, but only abnormal results are displayed) Labs Reviewed  CBC - Abnormal; Notable for the following components:      Result Value   WBC 12.0 (*)    All other components within normal limits  BASIC METABOLIC PANEL - Abnormal; Notable for the following components:   Glucose, Bld 111 (*)    BUN 33 (*)    Creatinine, Ser 1.36 (*)    GFR calc non Af Amer 48 (*)    GFR calc Af Amer 56 (*)    All other components within normal limits  TROPONIN I    EKG EKG Interpretation  Date/Time:  Thursday January 11 2019 12:17:28 EST Ventricular Rate:  60 PR Interval:    QRS Duration: 114 QT Interval:  478 QTC Calculation: 478 R Axis:   -34 Text Interpretation:  Sinus rhythm Borderline prolonged PR interval Incomplete left bundle branch block Borderline prolonged QT interval No old tracing to compare Confirmed by Noemi Chapel [phone]) on 01/11/2019 12:23:54 PM   Radiology No results found.  Procedures Procedures (including critical care time)  Medications Ordered in ED Medications  aspirin chewable tablet 324 mg (324 mg Oral Not Given 01/11/19 1236)     Initial Impression / Assessment and Plan / ED Course  I have reviewed the triage vital signs and the nursing notes.  Pertinent labs & imaging results that were available during my care of the patient were reviewed by me and considered in my medical decision making (see chart for details).  Clinical Course as of Jan 11 1341  Thu Jan 11, 2019  1339 Baseline creatinine is higher at 1.5, today it looks better at 1.36.   White blood cell count is nonspecific but elevated at 12,000 and the troponin is normal.  He remains asymptomatic and his repeat blood pressure was 656 systolic suggestive that he is improving.  With over 8 hours of symptoms and a negative troponin and no symptoms for 7 hours I suspect that the patient is able to be discharged to follow-up in the outpatient setting.  He is agreeable.   [BM]    Clinical Course User Index [BM] Noemi Chapel, MD   The pt has no sx at this time, EKG is normal - without ischemia Labs are ordered, not consistent with classic angina  Will need close f/u with cards if tx is neg, pt agreeable.  Vitals:   01/11/19 1214 01/11/19 1216 01/11/19 1230 01/11/19 1300  BP:  (!) 178/79 (!) 176/80 127/71  Pulse:  (!) 58 (!) 59   Resp:  18 (!) 21 19  Temp:  97.7 F (36.5 C)    TempSrc:  Oral    SpO2:  98% 97%   Weight: 95 kg     Height: 1.854 m (6\' 1" )        Final Clinical Impressions(s) / ED Diagnoses   Final diagnoses:  Chest pain, central    ED Discharge Orders    None       Noemi Chapel, MD 01/11/19 1342

## 2019-01-11 NOTE — Telephone Encounter (Signed)
° ° °  Patient calling regarding BP and chest pain this morning for about 1 hour. Patient is NOT having chest pain at this time.  Patient went to PCP who is sending him to the ED for additional testing. Patient requesting call from nurse

## 2019-01-11 NOTE — Progress Notes (Signed)
Patient was administered four 81 mg tablets of Aspirin for a total of 324 mg by Laurell Roof. Medication was added to the [facility]. It was given at 1003. Lot number is [id number]. Expiration 03/2019

## 2019-01-11 NOTE — Telephone Encounter (Signed)
Primary nurse not available. Will route to Triage for further evaluation.

## 2019-01-11 NOTE — ED Triage Notes (Signed)
Throbbing cp in center of chest from 415-515 today.  Sent by pcp for cp workup.  Pt denies any pain at this moment.

## 2019-01-11 NOTE — Telephone Encounter (Signed)
Called patient back. Encouraged patient to follow the advise of his PCP. Patient saw PCP this morning. Per office note " ASA given, BP improved a little, may have been CP secondary to demand with acute increase in BP, no signs of CHF, RRR with systolic murmur - feel risk is high and he needs serial troponins - sent to [facility] ER".  Informed patient that a message would be sent to Dr. Curt Bears, so he is aware and for any further advisement.

## 2019-01-12 NOTE — Progress Notes (Signed)
Cardiology Office Note    Date:  01/15/2019   ID:  Bush, David Sep 27, 1934, MRN [id number]  PCP:  [name], MD  Electrophysiologist: Constance Haw, MD       Chief Complaint: ER follow up  History of Present Illness:   David CHAPPLE is a 85 y.o. male PAF on eliquis, CKD stage III, Barrett's esophagus, HTN and HLD present for ER follow up.   HX of PAF. Anticoagulated with Eliquis, on Toprol-XL and flecainide.    Echo 01/2018 showed normal LVEF, grade 1 DD, mild AS. Low risk stress test 02/2018.  Seen in ER 01/11/19 for chest pain. Felt atypical. Ruled out and send home.   Here today for follow up.  On day of ER visit, patient woke up from sleep at 4 AM lower substernal "throbbing" pain.  No associated symptoms.  Throbbing pain lasted for 30 to 60 minutes with self resolution.  He ruled out in ER.  He went to his routine exercise next day without recurrent of symptoms.  He exercise 3 times per week for about 45 minutes.  He does lifting, elliptical and walking.  He denies any exertional chest pain, shortness of breath or recent fatigue/slowing down.  His symptoms not similar to his typical GERD or atrial fibrillation.  He denies palpitation, orthopnea, PND, syncope, lower extremity edema or melena.   Past Medical History:  Diagnosis Date  . Barrett's esophagus   . Cancer ([place])   . CKD (chronic kidney disease) stage 3, GFR 30-59 ml/min (HCC)   . Hyperlipidemia   . Hypertension   . Skin cancer    squamous cell of face    Past Surgical History:  Procedure Laterality Date  . CATARACT EXTRACTION, BILATERAL  2012  . KNEE ARTHROSCOPY Left 2010   torn meniscus  . SKIN CANCER EXCISION  2015, 2012  . UPPER GASTROINTESTINAL ENDOSCOPY     barrett's esophagus  . WISDOM TOOTH EXTRACTION  1980    Current Medications: Prior to Admission medications   Medication Sig Start Date End Date Taking? Authorizing Provider  acetaminophen (TYLENOL) 500 MG tablet Take  1,000 mg by mouth every 6 (six) hours as needed for moderate pain.    [provider]  apixaban (ELIQUIS) 2.5 MG TABS tablet Take 1 tablet (2.5 mg total) by mouth 2 (two) times daily. 02/16/18   Camnitz, Will Hassell Done, MD  benazepril (LOTENSIN) 40 MG tablet TAKE 1 TABLET DAILY 09/18/18   [name], MD  finasteride (PROSCAR) 5 MG tablet Take 1 tablet (5 mg total) by mouth daily. 02/09/18   [name], MD  flecainide (TAMBOCOR) 100 MG tablet TAKE 1 TABLET TWICE A DAY (STOP MULTAQ) 06/22/18   Camnitz, Ocie Doyne, MD  fluocinonide ointment (LIDEX) 0.05 % APP AA BID 01/05/19   [provider]  Multiple Vitamins-Minerals (CENTRUM SILVER PO) Take 1 tablet by mouth daily.    [provider]  pantoprazole (PROTONIX) 40 MG tablet TAKE 1 TABLET DAILY 10/09/18   [name], MD  predniSONE (DELTASONE) 10 MG tablet  01/05/19   [provider]  rosuvastatin (CRESTOR) 20 MG tablet Take 1 tablet (20 mg total) by mouth daily. 09/25/18   [name], MD  TOPROL XL 25 MG 24 hr tablet TAKE 1 TABLET DAILY WITH OR IMMEDIATELY FOLLOWING A MEAL 06/22/18   Camnitz, Ocie Doyne, MD    Allergies:   Patient has no known allergies.   Social History  Socioeconomic History  . Marital status: Married    Spouse name: Not on file  . Number of children: Not on file  . Years of education: Not on file  . Highest education level: Not on file  Occupational History  . Not on file  Social Needs  . Financial resource strain: Not on file  . Food insecurity:    Worry: Not on file    Inability: Not on file  . Transportation needs:    Medical: Not on file    Non-medical: Not on file  Tobacco Use  . Smoking status: Former Smoker    Last attempt to quit: 12/06/1966    Years since quitting: 52.1  . Smokeless tobacco: Never Used  Substance and Sexual Activity  . 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  . Drug  use: No  . Sexual activity: Not on file  Lifestyle  . Physical activity:    Days per week: Not on file    Minutes per session: Not on file  . Stress: Not on file  Relationships  . Social connections:    Talks on phone: Not on file    Gets together: Not on file    Attends religious service: Not on file    Active member of club or organization: Not on file    Attends meetings of clubs or organizations: Not on file    Relationship status: Not on file  Other Topics Concern  . Not on file  Social History Narrative  . Not on file     Family History:  The patient's family history includes Esophageal cancer in his mother; Kidney cancer in his sister; Kidney disease in his brother; Other in his brother; Stroke in his father.   ROS:   Please see the history of present illness.    ROS All other systems reviewed and are negative.   PHYSICAL EXAM:   VS:  BP 134/74   Pulse 70   Ht 6\' 1"  (1.854 m)   Wt 208 lb (94.3 kg)   SpO2 99%   BMI 27.44 kg/m    GEN: Well nourished, well developed, in no acute distress  HEENT: normal  Neck: no JVD, carotid bruits, or masses Cardiac: RRR; no murmurs, rubs, or gallops,no edema  Respiratory:  clear to auscultation bilaterally, normal work of breathing GI: soft, nontender, nondistended, + BS MS: no deformity or atrophy  Skin: warm and dry, no rash Neuro:  Alert and Oriented x 3, Strength and sensation are intact Psych: euthymic mood, full affect  Wt Readings from Last 3 Encounters:  01/15/19 208 lb (94.3 kg)  01/11/19 209 lb 7 oz (95 kg)  01/11/19 210 lb 6 oz (95.4 kg)      Studies/Labs Reviewed:   EKG:  EKG is not ordered today.    Recent Labs: 01/17/2018: TSH 3.500 09/22/2018: ALT 18 01/11/2019: BUN 33; Creatinine, Ser 1.36; Hemoglobin 14.0; Platelets 184; Potassium 4.0; Sodium 135   Lipid Panel    Component Value Date/Time   CHOL 155 09/22/2018 0807   TRIG 214 (H) 09/22/2018 0807   HDL 38 (L) 09/22/2018 0807   CHOLHDL 4.1  09/22/2018 0807   VLDL 42 (H) 08/20/2016 1105   LDLCALC 86 09/22/2018 0807    Additional studies/ records that were reviewed today include:   As summarized above   ASSESSMENT & PLAN:    1. Chest pain -He woke up from sleep with  throbbing sternal pain.  No other associated symptoms.  He ruled out in the ER as summarized above.  Patient at baseline very active and does not have any exertional symptoms.  Actually, he went to his routine exercise regimen next day after his chest pain episode without reproducible symptoms.  His symptoms is not similar to is typical GERD or afib. He had a low risk stress test 02/2018.  Discussed further evaluation versus observe symptoms.  Patient wants to wait for further evaluation currently.  Trial of PRN sublingual nitroglycerin.  If recurrent symptoms, he will call us with probable ischemic evaluation with cardiac cath.  Continue statin and beta-blocker.  2. PAF -Denies palpitation.  Continue Eliquis and beta-blocker.  3. HTN -Blood pressure stable on current medication.  4. HLD - 09/22/2018: Cholesterol 155; HDL 38; LDL Cholesterol (Calc) 86; Triglycerides 214  -Continue Crestor 20 mg daily.   Medication Adjustments/Labs and Tests Ordered: Current medicines are reviewed at length with the patient today.  Concerns regarding medicines are outlined above.  Medication changes, Labs and Tests ordered today are listed in the Patient Instructions below. Patient Instructions  Medication Instructions:  Your physician has recommended you make the following change in your medication: 1. Nitroglycerin was sent in today to patient's requested pharmacy.  Take one tablet under tongue with onset of chest pain, can take one tablet every 15 minutes up to 3 tablet, if no relief call 911.    Labwork: -None  Testing/Procedures: -None  Follow-Up: Your physician recommends that you keep your scheduled  follow-up appointment with Dr. Curt Bears.    Any Other Special  Instructions Will Be Listed Below (If Applicable).     If you need a refill on your cardiac medications before your next appointment, please call your pharmacy.      Jarrett Soho, [place]  01/15/2019 9:28 AM    [facility] [address], [place], [place]  [address] Phone: [phone]; Fax: [phone]

## 2019-01-15 ENCOUNTER — Ambulatory Visit (INDEPENDENT_AMBULATORY_CARE_PROVIDER_SITE_OTHER): Payer: Medicare Other | Admitting: Physician Assistant

## 2019-01-15 ENCOUNTER — Encounter: Payer: Self-pay | Admitting: Physician Assistant

## 2019-01-15 VITALS — BP 134/74 | HR 70 | Ht 73.0 in | Wt 208.0 lb

## 2019-01-15 DIAGNOSIS — R079 Chest pain, unspecified: Secondary | ICD-10-CM

## 2019-01-15 DIAGNOSIS — E785 Hyperlipidemia, unspecified: Secondary | ICD-10-CM | POA: Diagnosis not present

## 2019-01-15 DIAGNOSIS — I10 Essential (primary) hypertension: Secondary | ICD-10-CM

## 2019-01-15 DIAGNOSIS — I480 Paroxysmal atrial fibrillation: Secondary | ICD-10-CM

## 2019-01-15 MED ORDER — NITROGLYCERIN 0.4 MG SL SUBL: 0.4000 mg | SUBLINGUAL_TABLET | SUBLINGUAL | 3 refills | Status: DC | PRN

## 2019-01-15 NOTE — Patient Instructions (Signed)
Medication Instructions:  Your physician has recommended you make the following change in your medication: 1. Nitroglycerin was sent in today to patient's requested pharmacy.  Take one tablet under tongue with onset of chest pain, can take one tablet every 15 minutes up to 3 tablet, if no relief call 911.    Labwork: -None  Testing/Procedures: -None  Follow-Up: Your physician recommends that you keep your scheduled  follow-up appointment with Dr. Curt Bears.    Any Other Special Instructions Will Be Listed Below (If Applicable).     If you need a refill on your cardiac medications before your next appointment, please call your pharmacy.

## 2019-01-17 ENCOUNTER — Other Ambulatory Visit: Payer: Self-pay | Admitting: Family Medicine

## 2019-01-24 ENCOUNTER — Other Ambulatory Visit: Payer: Self-pay | Admitting: Cardiology

## 2019-01-24 NOTE — Telephone Encounter (Signed)
SCr previously > 1.5, more recently has improved to 1.35. Will continue Eliquis 2.5mg  BID for now, but recheck BMET at f/u appt with Dr Curt Bears in 2 months.

## 2019-01-30 ENCOUNTER — Ambulatory Visit (INDEPENDENT_AMBULATORY_CARE_PROVIDER_SITE_OTHER): Payer: Medicare Other | Admitting: Gastroenterology

## 2019-01-30 ENCOUNTER — Encounter: Payer: Self-pay | Admitting: Gastroenterology

## 2019-01-30 VITALS — BP 140/70 | HR 56 | Ht 71.75 in | Wt 209.4 lb

## 2019-01-30 DIAGNOSIS — Z7901 Long term (current) use of anticoagulants: Secondary | ICD-10-CM

## 2019-01-30 DIAGNOSIS — I4811 Longstanding persistent atrial fibrillation: Secondary | ICD-10-CM | POA: Diagnosis not present

## 2019-01-30 DIAGNOSIS — K2270 Barrett's esophagus without dysplasia: Secondary | ICD-10-CM

## 2019-01-30 DIAGNOSIS — R079 Chest pain, unspecified: Secondary | ICD-10-CM | POA: Diagnosis not present

## 2019-01-30 DIAGNOSIS — K219 Gastro-esophageal reflux disease without esophagitis: Secondary | ICD-10-CM

## 2019-01-30 NOTE — Progress Notes (Signed)
[facility] Progress Note  Chief Complaint: Barrett's esophagus  Subjective  History:  David Bush is here to see me for history of Barrett's esophagus.  For the last several weeks he has had intermittent episodes of nonexertional chest pain, the first episode occurring early one morning and waking him from sleep, at least one more episode after a meal.  He admits that the first episode happened after eating a large meal of ribs at a neighbor's cookout. He denies dysphagia or odynophagia.  He has an occasional dry cough and feeling of a fullness in the throat. This pain is not typical for the heartburn that he would get many years in the past from his acid reflux.  He rarely gets heartburn now since he has been on PPI for about 15 years. 3 cm segment of Barrett's esophagus without dysplasia June 2017.  Previous history of Barrett's esophagus followed by Dr. Olevia Perches. ED 01/11/19 Chest pain in early AM. Cardiology visit 01/15/19 -note reviewed.  They offered him a stress test, which he declined because he had one last year.  He was prescribed nitroglycerin for these episodes, but has not taken it.  ROS:  Respiratory: no dyspnea Denies weight loss Remainder of systems negative except as above  The patient's Past Medical, Family and Social History were reviewed and are on file in the EMR. Past Medical History:  Diagnosis Date  . Atrial fibrillation ([place])   . Barrett's esophagus   . CKD (chronic kidney disease) stage 3, GFR 30-59 ml/min (HCC)   . Hyperlipidemia   . Hypertension   . Skin cancer    squamous cell of face    Objective:  Med list reviewed  Current Outpatient Medications:  .  acetaminophen (TYLENOL) 500 MG tablet, Take 1,000 mg by mouth every 6 (six) hours as needed for moderate pain., Disp: , Rfl:  .  benazepril (LOTENSIN) 40 MG tablet, TAKE 1 TABLET DAILY, Disp: 90 tablet, Rfl: 4 .  ELIQUIS 2.5 MG TABS tablet, TAKE 1 TABLET TWICE A DAY, Disp: 180 tablet, Rfl: 0 .   finasteride (PROSCAR) 5 MG tablet, TAKE 1 TABLET DAILY, Disp: 90 tablet, Rfl: 1 .  flecainide (TAMBOCOR) 100 MG tablet, TAKE 1 TABLET TWICE A DAY (STOP MULTAQ), Disp: 180 tablet, Rfl: 2 .  fluocinonide ointment (LIDEX) 0.05 %, APP AA BID, Disp: , Rfl:  .  Multiple Vitamins-Minerals (CENTRUM SILVER PO), Take 1 tablet by mouth daily., Disp: , Rfl:  .  nitroGLYCERIN (NITROSTAT) 0.4 MG SL tablet, Place 1 tablet (0.4 mg total) under the tongue every 5 (five) minutes as needed for chest pain., Disp: 25 tablet, Rfl: 3 .  pantoprazole (PROTONIX) 40 MG tablet, TAKE 1 TABLET DAILY, Disp: 90 tablet, Rfl: 4 .  rosuvastatin (CRESTOR) 20 MG tablet, Take 1 tablet (20 mg total) by mouth daily., Disp: 90 tablet, Rfl: 3 .  TOPROL XL 25 MG 24 hr tablet, TAKE 1 TABLET DAILY WITH OR IMMEDIATELY FOLLOWING A MEAL, Disp: 90 tablet, Rfl: 2   Vital signs in last 24 hrs: Vitals:   01/30/19 1039  BP: 140/70  Pulse: (!) 56    Physical Exam  He is well-appearing with normal vocal quality.  HEENT: sclera anicteric, oral mucosa moist without lesions  Neck: supple, no thyromegaly, JVD or lymphadenopathy  Cardiac: RRR with soft systolic murmur, E3M6 heard, no peripheral edema  Pulm: clear to auscultation bilaterally, normal RR and effort noted  Abdomen: soft, no tenderness, with active bowel sounds. No guarding or  palpable hepatosplenomegaly.  Skin; warm and dry, no jaundice or rash  Recent Labs:  CBC Latest Ref Rng & Units 01/11/2019 09/22/2018 03/17/2018  WBC 4.0 - 10.5 K/uL 12.0(H) 5.9 6.9  Hemoglobin 13.0 - 17.0 g/dL 14.0 13.5 14.2  Hematocrit 39.0 - 52.0 % 42.5 40.1 40.6  Platelets 150 - 400 K/uL 184 181 194   CMP Latest Ref Rng & Units 01/11/2019 09/22/2018 03/17/2018  Glucose 70 - 99 mg/dL 111(H) 96 114(H)  BUN 8 - 23 mg/dL 33(H) 16 32(H)  Creatinine 0.61 - 1.24 mg/dL 1.36(H) 1.30(H) 1.52(H)  Sodium 135 - 145 mmol/L 135 140 138  Potassium 3.5 - 5.1 mmol/L 4.0 4.8 4.5  Chloride 98 - 111 mmol/L 104 106  104  CO2 22 - 32 mmol/L 25 28 29   Calcium 8.9 - 10.3 mg/dL 9.0 9.4 9.6  Total Protein 6.1 - 8.1 g/dL - 6.3 7.0  Total Bilirubin 0.2 - 1.2 mg/dL - 0.6 0.5  Alkaline Phos 40 - 115 U/L - - -  AST 10 - 35 U/L - 19 16  ALT 9 - 46 U/L - 18 14    Troponin negative during ED visit  Radiologic studies:  None recent   @ASSESSMENTPLANBEGIN @ Assessment: Encounter Diagnoses  Name Primary?  . Barrett's esophagus without dysplasia Yes  . Chest pain, unspecified type   . Longstanding persistent atrial fibrillation   . Current use of long term anticoagulation   . Gastroesophageal reflux disease without esophagitis    Barrett's esophagus without dysplasia, due for surveillance EGD this year.  I suspect this will likely be his last necessary EGD at this age.  He would need to be off oral anticoagulation 2 days prior, and we will discuss this with his cardiologist by sending him a note.  The nature of his chest pain is unclear.  I agree it does not sound likely to be angina since it is not exertional.  Nevertheless, he has a cardiac history and I would like to know from his cardiologist if they are planning a stress test or at least what they think his sedation risk is for upper endoscopy with propofol. I will message them with a copy of this note.  Therefore, upper endoscopy was not scheduled today.  His pain could be from reflux, especially since it happened 1 time laying down and another after a meal.  I recommended he control portions, have last food at least 4 hours before going to bed at night, and stay upright for least 30 minutes after meals.  As needed Maalox can be taken.  We will communicate with him about this after I have heard from his cardiologist.  Total time 30 minutes, over half spent face-to-face with patient in counseling and coordination of care.   Nelida Meuse III

## 2019-01-30 NOTE — Progress Notes (Signed)
Thanks for the reply.  How do you feel about him off [place] 1-2 days prior to scope?  - Wilfrid Lund

## 2019-01-30 NOTE — Patient Instructions (Signed)
If you are age 83 or older, your body mass index should be between 23-30. Your Body mass index is 28.59 kg/m. If this is out of the aforementioned range listed, please consider follow up with your Primary Care Provider.  If you are age 53 or younger, your body mass index should be between 19-25. Your Body mass index is 28.59 kg/m. If this is out of the aformentioned range listed, please consider follow up with your Primary Care Provider.     It was a pleasure to see you today!  Dr. Loletha Carrow

## 2019-01-31 ENCOUNTER — Telehealth: Payer: Self-pay | Admitting: Gastroenterology

## 2019-01-31 NOTE — Telephone Encounter (Signed)
I saw David Bush earlier this week regarding his history of Barrett's esophagus.  His cardiologist feels that his chest pain is not angina, and that he does not need a stress test at this time.  They also feel it is acceptable risk for him to be off his Eliquis 2 days prior to procedure. (Creatinine 1.36, GFR 48)  Please contact David Bush and make arrangements if he is ready for his upper endoscopy in the [place].

## 2019-01-31 NOTE — Telephone Encounter (Signed)
-----   Message from [place], [place] sent at 01/31/2019 10:00 AM EST ----- I have reviewed with Dr. Curt Bears. His chest pain was atypical for angina and he ruled out in ER. He is very active without chest pain or dyspnea. Getting > 4 mets of activity easily.   Given past medical history and time since last visit, based on ACC/AHA guidelines, David Bush would be at acceptable risk for the planned procedure without further cardiovascular testing.   Okay to hold Eliquis for 2 days.   Please call with questions.  Leanor Kail, [place] 01/31/2019, 10:01 AM     ----- Message ----- From: Doran Stabler, MD Sent: 01/30/2019   1:01 PM EST To: Constance Haw, MD, #  Please see my note from today's visit. [name] is in need of a routine upper endoscopy for history of Barrett's esophagus.  I am concerned about the recent chest pain episodes even though they have not been exertional.  Please let me know what you think regarding risk for sedation with propofol for upper endoscopy.  If you feel he needs a stress test prior to that, please contact him and make the arrangements.  If not, please let me know that as well so we can schedule his upper endoscopy at the appropriate time.  We would also need to know if you feel it is acceptable risk for him to be off his oral anticoagulation 2 days prior to procedure.  Thanks very much in advance,   - Wilfrid Lund, MD    [facility]

## 2019-02-05 NOTE — Telephone Encounter (Signed)
Pt scheduled for previsit 02/09/19@9am , EGD scheduled in the [facility] 02/27/19@10am . Pts wife aware of appts.

## 2019-02-09 ENCOUNTER — Ambulatory Visit (AMBULATORY_SURGERY_CENTER): Payer: Self-pay

## 2019-02-09 VITALS — Ht 72.0 in | Wt 211.0 lb

## 2019-02-09 DIAGNOSIS — K2270 Barrett's esophagus without dysplasia: Secondary | ICD-10-CM

## 2019-02-09 DIAGNOSIS — R1310 Dysphagia, unspecified: Secondary | ICD-10-CM

## 2019-02-09 NOTE — Progress Notes (Signed)
Per pt, no allergies to soy or egg products.Pt not taking any weight loss meds or using  O2 at home.  Pt refused emmi video. 

## 2019-02-20 ENCOUNTER — Telehealth: Payer: Self-pay | Admitting: Gastroenterology

## 2019-02-20 NOTE — Telephone Encounter (Signed)
Yes, that is appropriate.  He is a routine procedure.

## 2019-02-20 NOTE — Telephone Encounter (Signed)
Put in a Recall Reminder so I can make sure we keep track of patient

## 2019-02-22 DIAGNOSIS — L989 Disorder of the skin and subcutaneous tissue, unspecified: Secondary | ICD-10-CM | POA: Diagnosis not present

## 2019-02-22 DIAGNOSIS — C44629 Squamous cell carcinoma of skin of left upper limb, including shoulder: Secondary | ICD-10-CM | POA: Diagnosis not present

## 2019-02-27 ENCOUNTER — Encounter: Payer: Medicare Other | Admitting: Gastroenterology

## 2019-03-19 ENCOUNTER — Other Ambulatory Visit: Payer: Self-pay | Admitting: Cardiology

## 2019-03-26 ENCOUNTER — Ambulatory Visit: Payer: Medicare Other | Admitting: Cardiology

## 2019-03-27 ENCOUNTER — Other Ambulatory Visit: Payer: Medicare Other

## 2019-04-03 ENCOUNTER — Ambulatory Visit: Payer: Medicare Other | Admitting: Cardiology

## 2019-04-06 ENCOUNTER — Other Ambulatory Visit: Payer: Self-pay

## 2019-04-06 ENCOUNTER — Ambulatory Visit (INDEPENDENT_AMBULATORY_CARE_PROVIDER_SITE_OTHER): Payer: Medicare Other | Admitting: Family Medicine

## 2019-04-06 DIAGNOSIS — R5081 Fever presenting with conditions classified elsewhere: Secondary | ICD-10-CM

## 2019-04-06 DIAGNOSIS — R059 Cough, unspecified: Secondary | ICD-10-CM

## 2019-04-06 DIAGNOSIS — N410 Acute prostatitis: Secondary | ICD-10-CM | POA: Diagnosis not present

## 2019-04-06 DIAGNOSIS — R05 Cough: Secondary | ICD-10-CM | POA: Diagnosis not present

## 2019-04-06 MED ORDER — LEVOFLOXACIN 500 MG PO TABS: 500.0000 mg | ORAL_TABLET | Freq: Every day | ORAL | 0 refills | Status: DC

## 2019-04-06 NOTE — Progress Notes (Signed)
Subjective:    Patient ID: David Bush, male    DOB: 1934/02/26, 85 y.o.   MRN: [id number]  HPI  Patient is being seen today as a telephone visit.  Phone call began at 1029.  Phone call ended at 1047.  Patient consents to be seen by telephone.  He is currently at home.  I am currently in my office.  His wife is on the phone with him.  The patient has difficult symptoms to determine the cause.  He states for the last 2 to 3 weeks, he has been having increasing difficulty with urination.  He states that he will have a tremendous urge to urinate.  However when he goes to the bathroom, his urine will only dribble out.  He reports increasing urgency and increasing frequency but increasing hesitancy and urinary retention.  He also reports dysuria.  Yesterday he developed chills and rigors and a fever to 101.2.  What complicates matters is he has had a cough for the last 2 to 3 weeks as well.  This cough was dry and nonproductive.  Recently the cough began to worsen over the last 2 days.  The cough is still dry and nonproductive.  He denies any known sick contacts.  He denies any shortness of breath.  He denies any chest pain.  However the cough has become more frequent.  He denies any pleurisy.  He denies any hemoptysis.  He is speaking full and complete sentences today over the telephone with no respiratory distress and no difficulty breathing.  His temperature right now is 99.0. Past Medical History:  Diagnosis Date  . Atrial fibrillation ([place])   . Barrett's esophagus   . Chest pain    in feb 2020/ saw cardiologist- no heart attack!  . CKD (chronic kidney disease) stage 3, GFR 30-59 ml/min (HCC)   . GERD (gastroesophageal reflux disease)   . Hx of hiatal hernia   . Hyperlipidemia   . Hypertension   . Skin cancer    squamous cell of face/arm/legs  . Substance abuse ([place])    stopped drinking in 1993   Past Surgical History:  Procedure Laterality Date  . CATARACT EXTRACTION, BILATERAL   2012  . COLONOSCOPY    . KNEE ARTHROSCOPY Left 2010   torn meniscus  . SKIN CANCER EXCISION  2015, 2012   face, arms and legs  . UPPER GASTROINTESTINAL ENDOSCOPY     barrett's esophagus  . WISDOM TOOTH EXTRACTION  1980   Current Outpatient Medications on File Prior to Visit  Medication Sig Dispense Refill  . acetaminophen (TYLENOL) 500 MG tablet Take 1,000 mg by mouth every 6 (six) hours as needed for moderate pain.    . benazepril (LOTENSIN) 40 MG tablet TAKE 1 TABLET DAILY 90 tablet 4  . ELIQUIS 2.5 MG TABS tablet TAKE 1 TABLET TWICE A DAY 180 tablet 0  . finasteride (PROSCAR) 5 MG tablet TAKE 1 TABLET DAILY 90 tablet 1  . flecainide (TAMBOCOR) 100 MG tablet TAKE 1 TABLET TWICE A DAY (STOP MULTAQ) 180 tablet 3  . fluocinonide ointment (LIDEX) 0.05 % APP AA BID    . Multiple Vitamins-Minerals (CENTRUM SILVER PO) Take 1 tablet by mouth daily.    . nitroGLYCERIN (NITROSTAT) 0.4 MG SL tablet Place 1 tablet (0.4 mg total) under the tongue every 5 (five) minutes as needed for chest pain. (Patient not taking: Reported on 02/09/2019) 25 tablet 3  . pantoprazole (PROTONIX) 40 MG tablet TAKE 1 TABLET DAILY 90  tablet 4  . rosuvastatin (CRESTOR) 20 MG tablet Take 1 tablet (20 mg total) by mouth daily. 90 tablet 3  . TOPROL XL 25 MG 24 hr tablet TAKE 1 TABLET DAILY WITH OR IMMEDIATELY FOLLOWING A MEAL 90 tablet 3   No current facility-administered medications on file prior to visit.    No Known Allergies Social History   Socioeconomic History  . Marital status: Married    Spouse name: Not on file  . Number of children: Not on file  . Years of education: Not on file  . Highest education level: Not on file  Occupational History  . Not on file  Social Needs  . Financial resource strain: Not on file  . Food insecurity:    Worry: Not on file    Inability: Not on file  . Transportation needs:    Medical: Not on file    Non-medical: Not on file  Tobacco Use  . Smoking status: Former Smoker     Last attempt to quit: 12/06/1966    Years since quitting: 52.3  . Smokeless tobacco: Never Used  Substance and Sexual Activity  . 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  . Drug use: No  . Sexual activity: Not on file  Lifestyle  . Physical activity:    Days per week: Not on file    Minutes per session: Not on file  . Stress: Not on file  Relationships  . Social connections:    Talks on phone: Not on file    Gets together: Not on file    Attends religious service: Not on file    Active member of club or organization: Not on file    Attends meetings of clubs or organizations: Not on file    Relationship status: Not on file  . Intimate partner violence:    Fear of current or ex partner: Not on file    Emotionally abused: Not on file    Physically abused: Not on file    Forced sexual activity: Not on file  Other Topics Concern  . Not on file  Social History Narrative  . Not on file     Review of Systems  All other systems reviewed and are negative.      Objective:   Physical Exam No physical exam could be performed as the patient was seen over the telephone however he is speaking full and complete sentences without any respiratory distress and no coughing heard during our 17-minute phone encounter.  Patient also is mentating appropriately with no evidence of delirium       Assessment & Plan:  Fever in other diseases  Cough  Acute prostatitis  Patient has a fever.  Some of his symptoms are concerning for prostatitis.  Is also possible that he could have community-acquired pneumonia.  Also on the differential diagnosis would be COVID-19.  However the patient right now is having no respiratory distress and no shortness of breath.  Therefore I have not recommended that he go to the hospital for testing.  Instead I have recommended that we treat the patient symptomatically with Levaquin 500 mg daily for 7 days.   This would cover prostatitis.  It would also possibly cover community-acquired bacterial pneumonia.  I recommended that we clinically monitor the patient over the next 24 to 48 hours.  If the fever subsides and he improves, no further testing is required.  If the  fever worsens or if he develops shortness of breath or difficulty breathing or worsening cough I have recommended that he go for testing and possible hospitalization for COVID-19.  He has a friend who has a pulse oximeter.  His wife will get the pulse oximeter and check his oxygen saturations.  I recommended that he go to the hospital if his oxygen saturations less than 90% as well.  Patient and his wife are comfortable with this plan.  I will order COVID testing in case he goes to the hospital

## 2019-04-09 ENCOUNTER — Telehealth: Payer: Self-pay | Admitting: Family Medicine

## 2019-04-09 MED ORDER — TAMSULOSIN HCL 0.4 MG PO CAPS: 0.4000 mg | ORAL_CAPSULE | Freq: Every day | ORAL | 3 refills | Status: DC

## 2019-04-09 NOTE — Telephone Encounter (Signed)
I would give the medicine more time but he could take Flomax 0.4 mg p.o. nightly to see if this helps reduce the swelling inside the prostate and see if this would help his urination

## 2019-04-09 NOTE — Telephone Encounter (Signed)
Patient called in stating that he would like to give an update on his symptoms. He states since starting Levaquin on Friday he has not ran any more fevers since Saturday and his URI symptoms are starting to improve, however he states that he has seen no improvements in his urine issues as he is still getting up going several times per night. He would like to know if he should give the medication more time or if he should be taking a different antibiotic for urine symptoms.

## 2019-04-09 NOTE — Telephone Encounter (Signed)
Spoke with patient and informed him per Dr. Dennard Schaumann that we are sending in flomax and he needs to continue to take flomax. Patient verbalized understanding.

## 2019-04-20 ENCOUNTER — Telehealth: Payer: Self-pay | Admitting: Family Medicine

## 2019-04-20 DIAGNOSIS — Z87438 Personal history of other diseases of male genital organs: Secondary | ICD-10-CM

## 2019-04-20 NOTE — Telephone Encounter (Signed)
Pt's wife called and wanted to know if we could refer him to a urologists for his prostate?

## 2019-04-23 NOTE — Telephone Encounter (Signed)
What symptoms is he having?  Is he on the flomax?

## 2019-04-23 NOTE — Telephone Encounter (Signed)
Wife states that she had a brother die of prostate cancer and would like him checked out by a urologist.

## 2019-04-24 ENCOUNTER — Other Ambulatory Visit: Payer: Self-pay | Admitting: *Deleted

## 2019-04-24 ENCOUNTER — Other Ambulatory Visit: Payer: Self-pay | Admitting: Cardiology

## 2019-04-24 ENCOUNTER — Telehealth: Payer: Self-pay | Admitting: *Deleted

## 2019-04-24 MED ORDER — APIXABAN 5 MG PO TABS: 5.0000 mg | ORAL_TABLET | Freq: Two times a day (BID) | ORAL | 1 refills | Status: DC

## 2019-04-24 NOTE — Telephone Encounter (Signed)
Pt's wife insistent on pt seeing urologist - per Dr. Dennard Schaumann ok to refer. Referral placed.

## 2019-04-24 NOTE — Telephone Encounter (Signed)
Pt is a 85 yr old male who saw PA in [facility] on 01/15/19. Weight at that visit was 94.3Kg. SCr on 01/11/19 was 1.36. SCr previous year had been 1.5 or greater. Will send to Dr. Curt Bears to see if dose should be increased.

## 2019-04-24 NOTE — Telephone Encounter (Signed)
-----   Message from [name], MD sent at 04/24/2019  9:50 AM EDT ----- Regarding: RE: Eliquis dosage Yes, since creatinine has increased, would increase dose. Thanks! ----- Message ----- From: Zenovia Jarred, RN Sent: 04/24/2019   9:31 AM EDT To: Constance Haw, MD, Stanton Kidney, RN Subject: Eliquis dosage                                 Pt previously had SCr >1.5, more recent <1.5, last one being 1.36 on 01/11/19 hospital admission. He is over 65yrs old, but his weight is not less than 60Kg. I have a refill request, should I increase him to now 5mg  BID from 2.5mg  since his SCr is <1.5? Please advise. Thank you.   Sophie Quiles

## 2019-04-24 NOTE — Telephone Encounter (Signed)
We can check here.

## 2019-04-24 NOTE — Telephone Encounter (Signed)
Note sent to Dr. Curt Bears, see chart and he advised to increase dose to 5mg  BID. New Rx sent and I telephoned pt and made him aware of dose change and he verbalized understanding and states he will take 2 of the 2.5mg  BID until new Rx comes.

## 2019-05-04 ENCOUNTER — Encounter: Payer: Self-pay | Admitting: Family Medicine

## 2019-05-04 ENCOUNTER — Other Ambulatory Visit: Payer: Self-pay

## 2019-05-04 ENCOUNTER — Ambulatory Visit (INDEPENDENT_AMBULATORY_CARE_PROVIDER_SITE_OTHER): Payer: Medicare Other | Admitting: Family Medicine

## 2019-05-04 ENCOUNTER — Encounter: Payer: Self-pay | Admitting: Gastroenterology

## 2019-05-04 VITALS — BP 140/80 | HR 64 | Temp 98.0°F | Resp 16 | Ht 73.0 in | Wt 210.0 lb

## 2019-05-04 DIAGNOSIS — R001 Bradycardia, unspecified: Secondary | ICD-10-CM

## 2019-05-04 DIAGNOSIS — Z125 Encounter for screening for malignant neoplasm of prostate: Secondary | ICD-10-CM | POA: Diagnosis not present

## 2019-05-04 DIAGNOSIS — I951 Orthostatic hypotension: Secondary | ICD-10-CM

## 2019-05-04 NOTE — Progress Notes (Signed)
Subjective:    Patient ID: David Bush, male    DOB: November 19, 1934, 85 y.o.   MRN: [id number]  HPI  Patient presents today with a one-week history of dizziness.  He states that frequently when he bends over and stands up or occasionally doing nothing at all he will feel extremely dizzy and lightheaded.  He is checked his blood pressure at those times and found his systolic blood pressure to be in the 78G with a diastolic blood pressure in the 40s to 50s.  He is also checked his heart rate at times and found his heart rate as low was in the 40s.  He has not passed out but he has felt extremely lightheaded.  He also reports dizziness upon standing.  Of note I did recently add Flomax for lower urinary tract symptoms which could be exacerbating this.  He denies any recent dehydration.  He has been drinking plenty of water.  He denies any blood loss.  He denies any nausea vomiting or diarrhea.  He is on nasal Prill as well as metoprolol. Past Medical History:  Diagnosis Date  . Atrial fibrillation ([place])   . Barrett's esophagus   . Chest pain    in feb 2020/ saw cardiologist- no heart attack!  . CKD (chronic kidney disease) stage 3, GFR 30-59 ml/min ([facility])   . GERD (gastroesophageal reflux disease)   . Hx of hiatal hernia   . Hyperlipidemia   . Hypertension   . Skin cancer    squamous cell of face/arm/legs  . Substance abuse ([place])    stopped drinking in 1993   Past Surgical History:  Procedure Laterality Date  . CATARACT EXTRACTION, BILATERAL  2012  . COLONOSCOPY    . KNEE ARTHROSCOPY Left 2010   torn meniscus  . SKIN CANCER EXCISION  2015, 2012   face, arms and legs  . UPPER GASTROINTESTINAL ENDOSCOPY     barrett's esophagus  . WISDOM TOOTH EXTRACTION  1980   Current Outpatient Medications on File Prior to Visit  Medication Sig Dispense Refill  . acetaminophen (TYLENOL) 500 MG tablet Take 1,000 mg by mouth every 6 (six) hours as needed for moderate pain.    Marland Kitchen apixaban (ELIQUIS)  5 MG TABS tablet Take 1 tablet (5 mg total) by mouth 2 (two) times daily. 180 tablet 1  . benazepril (LOTENSIN) 40 MG tablet TAKE 1 TABLET DAILY 90 tablet 4  . finasteride (PROSCAR) 5 MG tablet TAKE 1 TABLET DAILY 90 tablet 1  . flecainide (TAMBOCOR) 100 MG tablet TAKE 1 TABLET TWICE A DAY (STOP MULTAQ) 180 tablet 3  . fluocinonide ointment (LIDEX) 0.05 % APP AA BID    . levofloxacin (LEVAQUIN) 500 MG tablet Take 1 tablet (500 mg total) by mouth daily. 7 tablet 0  . Multiple Vitamins-Minerals (CENTRUM SILVER PO) Take 1 tablet by mouth daily.    . pantoprazole (PROTONIX) 40 MG tablet TAKE 1 TABLET DAILY 90 tablet 4  . rosuvastatin (CRESTOR) 20 MG tablet Take 1 tablet (20 mg total) by mouth daily. 90 tablet 3  . tamsulosin (FLOMAX) 0.4 MG CAPS capsule Take 1 capsule (0.4 mg total) by mouth at bedtime. 30 capsule 3  . TOPROL XL 25 MG 24 hr tablet TAKE 1 TABLET DAILY WITH OR IMMEDIATELY FOLLOWING A MEAL 90 tablet 3  . nitroGLYCERIN (NITROSTAT) 0.4 MG SL tablet Place 1 tablet (0.4 mg total) under the tongue every 5 (five) minutes as needed for chest pain. (Patient not taking: Reported on  02/09/2019) 25 tablet 3   No current facility-administered medications on file prior to visit.    No Known Allergies Social History   Socioeconomic History  . Marital status: Married    Spouse name: Not on file  . Number of children: Not on file  . Years of education: Not on file  . Highest education level: Not on file  Occupational History  . Not on file  Social Needs  . Financial resource strain: Not on file  . Food insecurity:    Worry: Not on file    Inability: Not on file  . Transportation needs:    Medical: Not on file    Non-medical: Not on file  Tobacco Use  . Smoking status: Former Smoker    Last attempt to quit: 12/06/1966    Years since quitting: 52.4  . Smokeless tobacco: Never Used  Substance and Sexual Activity  . Alcohol use: No    Alcohol/week: 0.0 standard drinks    Comment:  recovering Alcholoic 22 years ago!  . Drug use: No  . Sexual activity: Not on file  Lifestyle  . Physical activity:    Days per week: Not on file    Minutes per session: Not on file  . Stress: Not on file  Relationships  . Social connections:    Talks on phone: Not on file    Gets together: Not on file    Attends religious service: Not on file    Active member of club or organization: Not on file    Attends meetings of clubs or organizations: Not on file    Relationship status: Not on file  . Intimate partner violence:    Fear of current or ex partner: Not on file    Emotionally abused: Not on file    Physically abused: Not on file    Forced sexual activity: Not on file  Other Topics Concern  . Not on file  Social History Narrative  . Not on file     Review of Systems  All other systems reviewed and are negative.      Objective:   Physical Exam Vitals signs reviewed.  Constitutional:      Appearance: Normal appearance.  Cardiovascular:     Rate and Rhythm: Normal rate and regular rhythm.     Pulses: Normal pulses.     Heart sounds: Murmur present. No friction rub. No gallop.   Pulmonary:     Effort: Pulmonary effort is normal. No respiratory distress.     Breath sounds: Normal breath sounds. No stridor. No wheezing, rhonchi or rales.  Chest:     Chest wall: No tenderness.  Genitourinary:    Prostate: Enlarged. Not tender and no nodules present.     Rectum: Normal.  Neurological:     Mental Status: He is alert.           Assessment & Plan:  Orthostatic hypotension - Plan: CBC with Differential/Platelet, COMPLETE METABOLIC PANEL WITH GFR  Bradycardia - Plan: CBC with Differential/Platelet, COMPLETE METABOLIC PANEL WITH GFR  Prostate cancer screening - Plan: PSA, PSA  I believe the patient's bradycardia is likely result of overmedication.  Therefore of asked the patient to wean down on his Toprol to 1/2 tablet a day for 1 week and then discontinue the  medication.  I want his heart rate to stay between 60 and 80 bpm.  If his heart rate rises dramatically will need to resume the Toprol.  I believe this will likely  also improve his blood pressure.  I want him checking his blood pressure every day and recording those values and reporting his heart rate and his blood pressure to me in 2 weeks.  If he continues to have episodes of "lightheadedness", I would next recommend a cardiac event monitor to rule out any cardiac arrhythmias that may be causing a drop in his blood pressure.  I believe his lower urinary tract symptoms are likely due to BPH.  I appreciate no palpable abnormality on exam today however I would like to check a PSA to rule out possible malignancy.  He has seen significant benefit from Flomax and therefore he does not want to stop this medication unless his orthostatic hypotension does not improve after holding the Toprol.

## 2019-05-07 ENCOUNTER — Other Ambulatory Visit: Payer: Medicare Other

## 2019-05-07 ENCOUNTER — Other Ambulatory Visit: Payer: Self-pay

## 2019-05-07 DIAGNOSIS — R001 Bradycardia, unspecified: Secondary | ICD-10-CM

## 2019-05-07 DIAGNOSIS — I951 Orthostatic hypotension: Secondary | ICD-10-CM | POA: Diagnosis not present

## 2019-05-07 DIAGNOSIS — Z125 Encounter for screening for malignant neoplasm of prostate: Secondary | ICD-10-CM | POA: Diagnosis not present

## 2019-05-08 DIAGNOSIS — C44629 Squamous cell carcinoma of skin of left upper limb, including shoulder: Secondary | ICD-10-CM | POA: Diagnosis not present

## 2019-05-08 DIAGNOSIS — D485 Neoplasm of uncertain behavior of skin: Secondary | ICD-10-CM | POA: Diagnosis not present

## 2019-05-08 LAB — CBC WITH DIFFERENTIAL/PLATELET
Absolute Monocytes: 281 cells/uL (ref 200–950)
Basophils Absolute: 32 cells/uL (ref 0–200)
Basophils Relative: 0.7 %
Eosinophils Absolute: 129 cells/uL (ref 15–500)
Eosinophils Relative: 2.8 %
HCT: 38.9 % (ref 38.5–50.0)
Hemoglobin: 13 g/dL — ABNORMAL LOW (ref 13.2–17.1)
Lymphs Abs: 952 cells/uL (ref 850–3900)
MCH: 30.9 pg (ref 27.0–33.0)
MCHC: 33.4 g/dL (ref 32.0–36.0)
MCV: 92.4 fL (ref 80.0–100.0)
MPV: 10.4 fL (ref 7.5–12.5)
Monocytes Relative: 6.1 %
Neutro Abs: 3206 cells/uL (ref 1500–7800)
Neutrophils Relative %: 69.7 %
Platelets: 145 10*3/uL (ref 140–400)
RBC: 4.21 10*6/uL (ref 4.20–5.80)
RDW: 12.4 % (ref 11.0–15.0)
Total Lymphocyte: 20.7 %
WBC: 4.6 10*3/uL (ref 3.8–10.8)

## 2019-05-08 LAB — COMPREHENSIVE METABOLIC PANEL
AG Ratio: 2 (calc) (ref 1.0–2.5)
ALT: 14 U/L (ref 9–46)
AST: 17 U/L (ref 10–35)
Albumin: 4.1 g/dL (ref 3.6–5.1)
Alkaline phosphatase (APISO): 51 U/L (ref 35–144)
BUN/Creatinine Ratio: 11 (calc) (ref 6–22)
BUN: 14 mg/dL (ref 7–25)
CO2: 24 mmol/L (ref 20–32)
Calcium: 8.8 mg/dL (ref 8.6–10.3)
Chloride: 105 mmol/L (ref 98–110)
Creat: 1.33 mg/dL — ABNORMAL HIGH (ref 0.70–1.11)
Globulin: 2.1 g/dL (calc) (ref 1.9–3.7)
Glucose, Bld: 123 mg/dL — ABNORMAL HIGH (ref 65–99)
Potassium: 4.4 mmol/L (ref 3.5–5.3)
Sodium: 139 mmol/L (ref 135–146)
Total Bilirubin: 0.5 mg/dL (ref 0.2–1.2)
Total Protein: 6.2 g/dL (ref 6.1–8.1)

## 2019-05-08 LAB — PSA: PSA: 1.4 ng/mL (ref <=4.0)

## 2019-05-17 ENCOUNTER — Telehealth: Payer: Self-pay | Admitting: Family Medicine

## 2019-05-17 NOTE — Telephone Encounter (Signed)
Pt states that when he exerts himself his BP drops and he gets dizzy. His lowest BP was 95/52. He states that this started when his heart doc put him on Eliquis and you put him on Flomax. He is not sure if that had anything to do with what is going on or not. Please advise.

## 2019-05-17 NOTE — Telephone Encounter (Signed)
Pt called back and states that his BP is all over the place not just low. Average highs are 170's/80's. His heart rate is usually in upper 80-90.

## 2019-05-18 NOTE — Telephone Encounter (Signed)
Patient aware of providers recommendations.  

## 2019-05-18 NOTE — Telephone Encounter (Signed)
Resume half the toprol for his bp. Stop flomax to see if dizziness improves.

## 2019-05-21 ENCOUNTER — Other Ambulatory Visit: Payer: Self-pay

## 2019-05-21 ENCOUNTER — Ambulatory Visit: Payer: Medicare Other | Admitting: *Deleted

## 2019-05-21 VITALS — Ht 71.0 in | Wt 203.0 lb

## 2019-05-21 DIAGNOSIS — K2270 Barrett's esophagus without dysplasia: Secondary | ICD-10-CM

## 2019-05-21 NOTE — Progress Notes (Signed)
Patient denies any allergies to egg or soy products. Patient denies complications with anesthesia/sedation.  Patient denies oxygen use at home and denies diet medications.   Pt verified name, DOB, address and insurance during PV today. Pt mailed instruction packet to included paper to complete and mail back to [facility] with addressed and stamped envelope, Emmi video, copy of consent form to read and not return, and instructions mailed in packet. PV completed over the phone. Pt encouraged to call with questions or issues after reviewing the packet of information. 

## 2019-05-25 ENCOUNTER — Telehealth: Payer: Self-pay | Admitting: Gastroenterology

## 2019-05-25 NOTE — Telephone Encounter (Signed)
Attempt to call pt, no answer, no option to leave voice message.  Covid-19 Screening Questions:  Do you now or have you had a fever in the last 14 days?   Do you have any respiratory symptoms of shortness of breath or cough now or in the last 14 days?    Do you have any family members or close contacts with diagnosed or suspected Covid-19 in the past 14 days?   Have you been tested for Covid-19 and found to be positive?   Pls inform pt that care partner may wait in the car or come up to the lobby during the procedure but will need to provide their own mask.

## 2019-05-25 NOTE — Telephone Encounter (Signed)
Pt called  Back and answered NO to all Covid questions

## 2019-05-28 ENCOUNTER — Other Ambulatory Visit: Payer: Self-pay

## 2019-05-28 ENCOUNTER — Ambulatory Visit (AMBULATORY_SURGERY_CENTER): Payer: Medicare Other | Admitting: Gastroenterology

## 2019-05-28 ENCOUNTER — Encounter: Payer: Self-pay | Admitting: Gastroenterology

## 2019-05-28 VITALS — BP 112/61 | HR 49 | Temp 97.7°F | Resp 15 | Ht 71.0 in | Wt 203.0 lb

## 2019-05-28 DIAGNOSIS — I4891 Unspecified atrial fibrillation: Secondary | ICD-10-CM | POA: Diagnosis not present

## 2019-05-28 DIAGNOSIS — K2270 Barrett's esophagus without dysplasia: Secondary | ICD-10-CM

## 2019-05-28 DIAGNOSIS — N183 Chronic kidney disease, stage 3 (moderate): Secondary | ICD-10-CM | POA: Diagnosis not present

## 2019-05-28 MED ORDER — SODIUM CHLORIDE 0.9 % IV SOLN: 500.0000 mL | Freq: Once | INTRAVENOUS | Status: DC

## 2019-05-28 NOTE — Op Note (Signed)
[facility] Patient Name: David Bush Procedure Date: 05/28/2019 9:48 AM MRN: [id number] Endoscopist: Mallie Mussel L. Loletha Carrow , MD Age: 85 Referring MD:  Date of Birth: [birth year] Gender: Male Account #: [id number] Procedure:                Upper GI endoscopy Indications:              Surveillance for malignancy due to personal history                            of Barrett's esophagus (no dysplasia 05/2016) Medicines:                Monitored Anesthesia Care Procedure:                Pre-Anesthesia Assessment:                           - Prior to the procedure, a History and Physical                            was performed, and patient medications and                            allergies were reviewed. The patient's tolerance of                            previous anesthesia was also reviewed. The risks                            and benefits of the procedure and the sedation                            options and risks were discussed with the patient.                            All questions were answered, and informed consent                            was obtained. Prior Anticoagulants: The patient has                            taken Eliquis (apixaban), last dose was 2 days                            prior to procedure. ASA Grade Assessment: III - A                            patient with severe systemic disease. After                            reviewing the risks and benefits, the patient was                            deemed in satisfactory condition to undergo the  procedure.                           After obtaining informed consent, the endoscope was                            passed under direct vision. Throughout the                            procedure, the patient's blood pressure, pulse, and                            oxygen saturations were monitored continuously. The                            Endoscope was introduced through the  mouth, and                            advanced to the second part of duodenum. The upper                            GI endoscopy was accomplished without difficulty.                            The patient tolerated the procedure well. Scope In: Scope Out: Findings:                 There were esophageal mucosal changes secondary to                            established long-segment Barrett's disease present                            in the lower third of the esophagus. The maximum                            longitudinal extent of these mucosal changes was 4                            cm in length. There were no raised or otherwise                            suspicious areas within the Barrett's mucosa.                            Mucosa was biopsied with a cold forceps for                            histology at intervals of 2 cm from 38 to 42 cm                            from the incisors. A total of 3 specimen bottles  were sent to pathology.                           The exam of the esophagus was otherwise normal.                           The stomach was normal.                           The cardia and gastric fundus were normal on                            retroflexion.                           The examined duodenum was normal. Complications:            No immediate complications. Estimated Blood Loss:     Estimated blood loss was minimal. Impression:               - Esophageal mucosal changes secondary to                            established long-segment Barrett's disease.                            Biopsied.                           - Normal stomach.                           - Normal examined duodenum. Recommendation:           - Patient has a contact number available for                            emergencies. The signs and symptoms of potential                            delayed complications were discussed with the                             patient. Return to normal activities tomorrow.                            Written discharge instructions were provided to the                            patient.                           - Resume previous diet.                           - Resume Eliquis (apixaban) at prior dose tomorrow.                           -  Await pathology results. If no dysplasia, no                            further surveillance warranted. David Bush L. Loletha Carrow, MD 05/28/2019 10:22:08 AM This report has been signed electronically.

## 2019-05-28 NOTE — Patient Instructions (Signed)
Resume eliquis tomorrow at previous dose.  Handout given for Barrett's Esophagus.  YOU HAD AN ENDOSCOPIC PROCEDURE TODAY AT [facility]:   Refer to the procedure report that was given to you for any specific questions about what was found during the examination.  If the procedure report does not answer your questions, please call your gastroenterologist to clarify.  If you requested that your care partner not be given the details of your procedure findings, then the procedure report has been included in a sealed envelope for you to review at your convenience later.  YOU SHOULD EXPECT: Some feelings of bloating in the abdomen. Passage of more gas than usual.  Walking can help get rid of the air that was put into your GI tract during the procedure and reduce the bloating. If you had a lower endoscopy (such as a colonoscopy or flexible sigmoidoscopy) you may notice spotting of blood in your stool or on the toilet paper. If you underwent a bowel prep for your procedure, you may not have a normal bowel movement for a few days.  Please Note:  You might notice some irritation and congestion in your nose or some drainage.  This is from the oxygen used during your procedure.  There is no need for concern and it should clear up in a day or so.  SYMPTOMS TO REPORT IMMEDIATELY:   Following upper endoscopy (EGD)  Vomiting of blood or coffee ground material  New chest pain or pain under the shoulder blades  Painful or persistently difficult swallowing  New shortness of breath  Fever of 100F or higher  Black, tarry-looking stools  For urgent or emergent issues, a gastroenterologist can be reached at any hour by calling [phone].   DIET:  We do recommend a small meal at first, but then you may proceed to your regular diet.  Drink plenty of fluids but you should avoid alcoholic beverages for 24 hours.  ACTIVITY:  You should plan to take it easy for the rest of today and you should  NOT DRIVE or use heavy machinery until tomorrow (because of the sedation medicines used during the test).    FOLLOW UP: Our staff will call the number listed on your records 48-72 hours following your procedure to check on you and address any questions or concerns that you may have regarding the information given to you following your procedure. If we do not reach you, we will leave a message.  We will attempt to reach you two times.  During this call, we will ask if you have developed any symptoms of COVID 19. If you develop any symptoms (ie: fever, flu-like symptoms, shortness of breath, cough etc.) before then, please call [phone].  If you test positive for Covid 19 in the 2 weeks post procedure, please call and report this information to [place].    If any biopsies were taken you will be contacted by phone or by letter within the next 1-3 weeks.  Please call us at [phone] if you have not heard about the biopsies in 3 weeks.    SIGNATURES/CONFIDENTIALITY: You and/or your care partner have signed paperwork which will be entered into your electronic medical record.  These signatures attest to the fact that that the information above on your After Visit Summary has been reviewed and is understood.  Full responsibility of the confidentiality of this discharge information lies with you and/or your care-partner.

## 2019-05-28 NOTE — Progress Notes (Signed)
Pt's states no medical or surgical changes since previsit or office visit.  Temp-cw  Vital signs-jb 

## 2019-05-28 NOTE — Progress Notes (Signed)
Report given to PACU, vss 

## 2019-05-30 ENCOUNTER — Telehealth: Payer: Self-pay | Admitting: *Deleted

## 2019-05-30 NOTE — Telephone Encounter (Signed)
  Follow up Call-  Call back number 05/28/2019  Post procedure Call Back phone  # [phone]  Permission to leave phone message Yes  Some recent data might be hidden     Patient questions:  Do you have a fever, pain , or abdominal swelling? No. Pain Score  0 *  Have you tolerated food without any problems? Yes.    Have you been able to return to your normal activities? Yes.    Do you have any questions about your discharge instructions: Diet   No. Medications  No. Follow up visit  No.  Do you have questions or concerns about your Care? No.  Actions: * If pain score is 4 or above: No action needed, pain <4.  1. Have you developed a fever since your procedure? no  2.   Have you had an respiratory symptoms (SOB or cough) since your procedure? no  3.   Have you tested positive for COVID 19 since your procedure no  4.   Have you had any family members/close contacts diagnosed with the COVID 19 since your procedure?  no   If yes to any of these questions please route to Joylene John, RN and Alphonsa Gin, Therapist, sports.

## 2019-05-31 ENCOUNTER — Encounter: Payer: Self-pay | Admitting: Gastroenterology

## 2019-05-31 DIAGNOSIS — C44629 Squamous cell carcinoma of skin of left upper limb, including shoulder: Secondary | ICD-10-CM | POA: Diagnosis not present

## 2019-06-04 ENCOUNTER — Telehealth: Payer: Self-pay | Admitting: Family Medicine

## 2019-06-04 NOTE — Telephone Encounter (Signed)
Recommend OV with is cardiologist In meantime HR is too low in 40's D/C the the toprol again Start norvasc 5mg  once a day for blood pressure continue the other meds - SEND TO PHARMACY 30 DAY Seems his is having problems maintaining HR, he may need repeat Holter monitor by cardiology  Call with BP readings on Wed or Thursday

## 2019-06-04 NOTE — Telephone Encounter (Signed)
Pt called to let us know that he is no better from last phone note. His BP's are 140-170/60 and HR High 40's. He is still having an issues with his BP being all over the place and some dizziness.

## 2019-06-05 MED ORDER — AMLODIPINE BESYLATE 5 MG PO TABS: 5.0000 mg | ORAL_TABLET | Freq: Every day | ORAL | 3 refills | Status: DC

## 2019-06-05 NOTE — Telephone Encounter (Signed)
Pt aware and med sent to pharm 

## 2019-06-06 ENCOUNTER — Other Ambulatory Visit: Payer: Self-pay | Admitting: Family Medicine

## 2019-06-06 DIAGNOSIS — Z79899 Other long term (current) drug therapy: Secondary | ICD-10-CM

## 2019-06-06 DIAGNOSIS — I480 Paroxysmal atrial fibrillation: Secondary | ICD-10-CM

## 2019-06-06 DIAGNOSIS — I10 Essential (primary) hypertension: Secondary | ICD-10-CM

## 2019-06-06 DIAGNOSIS — Z125 Encounter for screening for malignant neoplasm of prostate: Secondary | ICD-10-CM

## 2019-06-06 DIAGNOSIS — N183 Chronic kidney disease, stage 3 unspecified: Secondary | ICD-10-CM

## 2019-06-06 DIAGNOSIS — Z87438 Personal history of other diseases of male genital organs: Secondary | ICD-10-CM

## 2019-06-06 DIAGNOSIS — E7800 Pure hypercholesterolemia, unspecified: Secondary | ICD-10-CM

## 2019-06-06 DIAGNOSIS — Z0000 Encounter for general adult medical examination without abnormal findings: Secondary | ICD-10-CM

## 2019-06-06 NOTE — Telephone Encounter (Signed)
Called and schedule apt with Dr. Curt Bears for 07/03/19 @ 8:45  - pt aware

## 2019-06-14 DIAGNOSIS — C44629 Squamous cell carcinoma of skin of left upper limb, including shoulder: Secondary | ICD-10-CM | POA: Diagnosis not present

## 2019-07-02 ENCOUNTER — Telehealth: Payer: Self-pay | Admitting: Cardiology

## 2019-07-02 NOTE — Telephone Encounter (Signed)

## 2019-07-03 ENCOUNTER — Encounter: Payer: Self-pay | Admitting: Cardiology

## 2019-07-03 ENCOUNTER — Ambulatory Visit (INDEPENDENT_AMBULATORY_CARE_PROVIDER_SITE_OTHER): Payer: Medicare Other | Admitting: Cardiology

## 2019-07-03 ENCOUNTER — Other Ambulatory Visit: Payer: Self-pay

## 2019-07-03 VITALS — BP 122/78 | HR 76 | Ht 72.0 in | Wt 204.2 lb

## 2019-07-03 DIAGNOSIS — I480 Paroxysmal atrial fibrillation: Secondary | ICD-10-CM

## 2019-07-03 MED ORDER — DILTIAZEM HCL ER COATED BEADS 120 MG PO CP24: 120.0000 mg | ORAL_CAPSULE | Freq: Every day | ORAL | 0 refills | Status: DC

## 2019-07-03 MED ORDER — DILTIAZEM HCL ER COATED BEADS 120 MG PO CP24: 120.0000 mg | ORAL_CAPSULE | Freq: Every day | ORAL | 1 refills | Status: DC

## 2019-07-03 NOTE — Progress Notes (Signed)
Electrophysiology Office Note   Date:  07/03/2019   ID:  [name], [name] [birth year], MRN [id number]  PCP:  David Frizzle, MD  Cardiologist:   Primary Electrophysiologist:  David Wilms Meredith Leeds, MD    No chief complaint on file.    History of Present Illness: David Bush is a 85 y.o. male who is being seen today for the evaluation of bradycardia, palpitations at the request of David Frizzle, MD. Presenting today for electrophysiology evaluation.  He has a history of Barrett's esophagus, CKD stage III, hypertension, hyperlipidemia.  His medications have been adjusted due to his CKD for blood pressure.  Multaq was stopped at the last visit and he was started on flecainide.  Today, denies symptoms of palpitations, chest pain, shortness of breath, orthopnea, PND, lower extremity edema, claudication, dizziness, presyncope, syncope, bleeding, or neurologic sequela. The patient is tolerating medications without difficulties.  He is noted no further episodes of atrial fibrillation.  He is unfortunately having issues with dizziness.  His dizziness occurs mainly when he is exercising such as being on the golf course and working in his yard.  He gets dizzy when he stands up.  When he bends down to pick up the ball or teased a ball up, it occurs most times.  He says that he is not urinating when he is on the golf course and is potentially dehydrated.  Past Medical History:  Diagnosis Date  . Atrial fibrillation ([place])   . Barrett's esophagus   . Cataract    surgery to remove bilateral  . Chest pain    in feb 2020/ saw cardiologist- no heart attack!  . CKD (chronic kidney disease) stage 3, GFR 30-59 ml/min (HCC)   . GERD (gastroesophageal reflux disease)   . Hx of hiatal hernia   . Hyperlipidemia   . Hypertension   . Skin cancer    squamous cell of face/arm/legs  . Substance abuse ([place])    stopped drinking in 1993   Past Surgical History:  Procedure Laterality Date   . CATARACT EXTRACTION, BILATERAL  2012  . COLONOSCOPY    . KNEE ARTHROSCOPY Left 2010   torn meniscus  . SKIN CANCER EXCISION  2015, 2012   face, arms and legs  . UPPER GASTROINTESTINAL ENDOSCOPY     barrett's esophagus  . [place] EXTRACTION  1980  . WISDOM TOOTH EXTRACTION       Current Outpatient Medications  Medication Sig Dispense Refill  . acetaminophen (TYLENOL) 500 MG tablet Take 1,000 mg by mouth every 6 (six) hours as needed for moderate pain.    Marland Kitchen apixaban (ELIQUIS) 5 MG TABS tablet Take 1 tablet (5 mg total) by mouth 2 (two) times daily. 180 tablet 1  . benazepril (LOTENSIN) 40 MG tablet TAKE 1 TABLET DAILY 90 tablet 4  . finasteride (PROSCAR) 5 MG tablet TAKE 1 TABLET DAILY 90 tablet 1  . flecainide (TAMBOCOR) 100 MG tablet TAKE 1 TABLET TWICE A DAY (STOP MULTAQ) 180 tablet 3  . Multiple Vitamins-Minerals (CENTRUM SILVER PO) Take 1 tablet by mouth daily.    . nitroGLYCERIN (NITROSTAT) 0.4 MG SL tablet Place 1 tablet (0.4 mg total) under the tongue every 5 (five) minutes as needed for chest pain. 25 tablet 3  . pantoprazole (PROTONIX) 40 MG tablet TAKE 1 TABLET DAILY 90 tablet 4  . rosuvastatin (CRESTOR) 20 MG tablet Take 1 tablet (20 mg total) by mouth daily. 90 tablet 3  . diltiazem (CARDIZEM CD) 120  MG 24 hr capsule Take 1 capsule (120 mg total) by mouth at bedtime. 30 capsule 0   No current facility-administered medications for this visit.     Allergies:   Patient has no known allergies.   Social History:  The patient  reports that he quit smoking about 52 years ago. His smoking use included cigarettes. He quit after 16.00 years of use. He has never used smokeless tobacco. He reports that he does not drink alcohol or use drugs.   Family History:  The patient's family history includes Esophageal cancer in his mother; Heart disease in his brother; Kidney cancer in his sister; Kidney disease in his brother; Other in his brother; Stroke in his father.   ROS:   Please see the history of present illness.   Otherwise, review of systems is positive for none.   All other systems are reviewed and negative.   PHYSICAL EXAM: VS:  BP 122/78   Pulse 76   Ht 6' (1.829 m)   Wt 204 lb 3.2 oz (92.6 kg)   SpO2 97%   BMI 27.69 kg/m  , BMI Body mass index is 27.69 kg/m. GEN: Well nourished, well developed, in no acute distress  HEENT: normal  Neck: no JVD, carotid bruits, or masses Cardiac: RRR; no murmurs, rubs, or gallops,no edema  Respiratory:  clear to auscultation bilaterally, normal work of breathing GI: soft, nontender, nondistended, + BS MS: no deformity or atrophy  Skin: warm and dry Neuro:  Strength and sensation are intact Psych: euthymic mood, full affect  EKG:  EKG is ordered today. Personal review of the ekg ordered shows, rate 76  Recent Labs: 05/07/2019: ALT 14; BUN 14; Creat 1.33; Hemoglobin 13.0; Platelets 145; Potassium 4.4; Sodium 139    Lipid Panel     Component Value Date/Time   CHOL 155 09/22/2018 0807   TRIG 214 (H) 09/22/2018 0807   HDL 38 (L) 09/22/2018 0807   CHOLHDL 4.1 09/22/2018 0807   VLDL 42 (H) 08/20/2016 1105   LDLCALC 86 09/22/2018 0807     Wt Readings from Last 3 Encounters:  07/03/19 204 lb 3.2 oz (92.6 kg)  05/28/19 203 lb (92.1 kg)  05/21/19 203 lb (92.1 kg)      Other studies Reviewed: Additional studies/ records that were reviewed today include: Epic notes   Holter monitor 01/05/18-personally reviewed Minimum heart rate 41 bpm at 2:25 AM Heart rate 144 bpm 6:05 PM Average heart rate 54 bpm Sinus rhythm with APCs and PVCs Wide-complex tachycardia, irregular, likely atrial fibrillation  ASSESSMENT AND PLAN:  1.  Hypertension: Well-controlled today.  He is continued to have issues with dizziness.  He was put on amlodipine and his metoprolol was stopped.  His dizziness is continued.  I David Bush stop amlodipine as his blood pressure is well controlled today and start him on Norvasc.  2.   Hyperlipidemia: Continue Zocor  3. Paroxysmal atrial fibrillation: Currently on Eliquis and flecainide.  We David Bush start diltiazem as he needs rate control with flecainide.  This patients CHA2DS2-VASc Score and unadjusted Ischemic Stroke Rate (% per year) is equal to 3.2 % stroke rate/year from a score of 3  Above score calculated as 1 point each if present [CHF, HTN, DM, Vascular=MI/PAD/Aortic Plaque, Age if 65-74, or Male] Above score calculated as 2 points each if present [Age > 75, or Stroke/TIA/TE]  4.  Dizziness: It appears that he is at times orthostatic.  This occurs when he is bending over on the golf course.  I have told him to hydrate.  We Bitha Fauteux try to avoid any vasodilators.   Current medicines are reviewed at length with the patient today.   The patient does not have concerns regarding his medicines.  The following changes were made today: Stop amlodipine, start diltiazem  Labs/ tests ordered today include:  Orders Placed This Encounter  Procedures  . EKG 12-Lead     Disposition:   FU with Demarie Uhlig 6 months  Signed, Dakarai Mcglocklin Meredith Leeds, MD  07/03/2019 9:09 AM     [facility] [address] [place] [address] [phone] (office) [phone] (fax)

## 2019-07-03 NOTE — Patient Instructions (Addendum)
Medication Instructions:  Your physician has recommended you make the following change in your medication: 1 STOP Norvasc 2. START Diltiazem 120 mg once daily at bedtime  * If you need a refill on your cardiac medications before your next appointment, please call your pharmacy.   Labwork: None ordered  Testing/Procedures: None ordered  Follow-Up: Your physician wants you to follow-up in: 6 months with Dr. Curt Bears.  You will receive a reminder letter in the mail two months in advance. If you don't receive a letter, please call our office to schedule the follow-up appointment.  Thank you for choosing [facility]!!   Trinidad Curet, RN [phone]  Any Other Special Instructions Will Be Listed Below (If Applicable).  Diltiazem extended-release capsules or tablets What is this medicine? DILTIAZEM (dil TYE a zem) is a calcium-channel blocker. It affects the amount of calcium found in your heart and muscle cells. This relaxes your blood vessels, which can reduce the amount of work the heart has to do. This medicine is used to treat high blood pressure and chest pain caused by angina. This medicine may be used for other purposes; ask your health care provider or pharmacist if you have questions. COMMON BRAND NAME(S): Cardizem CD, Cardizem LA, Cardizem SR, Cartia XT, Dilacor XR, Dilt-CD, Diltia XT, Diltzac, Matzim LA, Rema Fendt, TIADYLT ER, Tiamate, Tiazac What should I tell my health care provider before I take this medicine? They need to know if you have any of these conditions:  heart problems, low blood pressure, irregular heartbeat  liver disease  previous heart attack  an unusual or allergic reaction to diltiazem, other medicines, foods, dyes, or preservatives  pregnant or trying to get pregnant  breast-feeding How should I use this medicine? Take this medicine by mouth with a glass of water. Follow the directions on the prescription label. Swallow whole, do not crush or  chew. Ask your doctor or pharmacist if your should take this medicine with food. Take your doses at regular intervals. Do not take your medicine more often then directed. Do not stop taking except on the advice of your doctor or health care professional. Ask your doctor or health care professional how to gradually reduce the dose. Talk to your pediatrician regarding the use of this medicine in children. Special care may be needed. Overdosage: If you think you have taken too much of this medicine contact a poison control center or emergency room at once. NOTE: This medicine is only for you. Do not share this medicine with others. What if I miss a dose? If you miss a dose, take it as soon as you can. If it is almost time for your next dose, take only that dose. Do not take double or extra doses. What may interact with this medicine? Do not take this medicine with any of the following medications:  cisapride  hawthorn  pimozide  ranolazine  red yeast rice This medicine may also interact with the following medications:  buspirone  carbamazepine  cimetidine  cyclosporine  digoxin  local anesthetics or general anesthetics  lovastatin  medicines for anxiety or difficulty sleeping like midazolam and triazolam  medicines for high blood pressure or heart problems  quinidine  rifampin, rifabutin, or rifapentine This list may not describe all possible interactions. Give your health care provider a list of all the medicines, herbs, non-prescription drugs, or dietary supplements you use. Also tell them if you smoke, drink alcohol, or use illegal drugs. Some items may interact with your medicine.  What should I watch for while using this medicine? Check your blood pressure and pulse rate regularly. Ask your doctor or health care professional what your blood pressure and pulse rate should be and when you should contact him or her. You may feel dizzy or lightheaded. Do not drive, use  machinery, or do anything that needs mental alertness until you know how this medicine affects you. To reduce the risk of dizzy or fainting spells, do not sit or stand up quickly, especially if you are an older patient. Alcohol can make you more dizzy or increase flushing and rapid heartbeats. Avoid alcoholic drinks. What side effects may I notice from receiving this medicine? Side effects that you should report to your doctor or health care professional as soon as possible:  allergic reactions like skin rash, itching or hives, swelling of the face, lips, or tongue  confusion, mental depression  feeling faint or lightheaded, falls  redness, blistering, peeling or loosening of the skin, including inside the mouth  slow, irregular heartbeat  swelling of the feet and ankles  unusual bleeding or bruising, pinpoint red spots on the skin Side effects that usually do not require medical attention (report to your doctor or health care professional if they continue or are bothersome):  constipation or diarrhea  difficulty sleeping  facial flushing  headache  nausea, vomiting  sexual dysfunction  weak or tired This list may not describe all possible side effects. Call your doctor for medical advice about side effects. You may report side effects to FDA at 1-800-FDA-1088. Where should I keep my medicine? Keep out of the reach of children. Store at room temperature between 15 and 30 degrees C (59 and 86 degrees F). Protect from humidity. Throw away any unused medicine after the expiration date. NOTE: This sheet is a summary. It may not cover all possible information. If you have questions about this medicine, talk to your doctor, pharmacist, or health care provider.  2020 Elsevier/Gold Standard (2008-03-14 14:35:47)

## 2019-07-13 ENCOUNTER — Telehealth: Payer: Self-pay | Admitting: Cardiology

## 2019-07-13 MED ORDER — METOPROLOL TARTRATE 25 MG PO TABS: 25.0000 mg | ORAL_TABLET | Freq: Two times a day (BID) | ORAL | 6 refills | Status: DC

## 2019-07-13 NOTE — Telephone Encounter (Signed)
New Message      Pt c/o medication issue:  1. Name of Medication: Diltiazem   2. How are you currently taking this medication (dosage and times per day)? 120 mg 1 x daily at night   3. Are you having a reaction (difficulty breathing--STAT)? No   4. What is your medication issue? Pt has been on this medication for 10 days, he woke up today with a rash on his chest and arms. He says his scalp is itching real bad as well.   Please call back

## 2019-07-13 NOTE — Telephone Encounter (Signed)
Pt advised to hold Diltiazem. Pt aware I will have Dr. Curt Bears review chart and will call back w/ advisement.

## 2019-07-13 NOTE — Telephone Encounter (Signed)
Advised to stop Diltiazem and start Lopressor 25 mg BID. Pt agreeable.  He will start new medication tomorrow. Advised to monitor BP/HR and call office back if BP not being controlled on Lopressor and/or new SE begin. Patient verbalized understanding and agreeable to plan.

## 2019-07-16 ENCOUNTER — Other Ambulatory Visit: Payer: Self-pay | Admitting: Family Medicine

## 2019-07-16 DIAGNOSIS — L820 Inflamed seborrheic keratosis: Secondary | ICD-10-CM | POA: Diagnosis not present

## 2019-07-16 DIAGNOSIS — L570 Actinic keratosis: Secondary | ICD-10-CM | POA: Diagnosis not present

## 2019-07-16 DIAGNOSIS — D485 Neoplasm of uncertain behavior of skin: Secondary | ICD-10-CM | POA: Diagnosis not present

## 2019-07-16 DIAGNOSIS — D0461 Carcinoma in situ of skin of right upper limb, including shoulder: Secondary | ICD-10-CM | POA: Diagnosis not present

## 2019-07-16 DIAGNOSIS — L233 Allergic contact dermatitis due to drugs in contact with skin: Secondary | ICD-10-CM | POA: Diagnosis not present

## 2019-07-16 DIAGNOSIS — L723 Sebaceous cyst: Secondary | ICD-10-CM | POA: Diagnosis not present

## 2019-07-16 DIAGNOSIS — Z85828 Personal history of other malignant neoplasm of skin: Secondary | ICD-10-CM | POA: Diagnosis not present

## 2019-08-09 DIAGNOSIS — D0461 Carcinoma in situ of skin of right upper limb, including shoulder: Secondary | ICD-10-CM | POA: Diagnosis not present

## 2019-08-27 ENCOUNTER — Other Ambulatory Visit: Payer: Self-pay | Admitting: Cardiology

## 2019-08-27 ENCOUNTER — Other Ambulatory Visit: Payer: Medicare Other

## 2019-08-27 MED ORDER — METOPROLOL TARTRATE 25 MG PO TABS: 25.0000 mg | ORAL_TABLET | Freq: Two times a day (BID) | ORAL | 3 refills | Status: DC

## 2019-08-28 ENCOUNTER — Telehealth: Payer: Self-pay | Admitting: Cardiology

## 2019-08-28 MED ORDER — AMLODIPINE BESYLATE 5 MG PO TABS: 5.0000 mg | ORAL_TABLET | Freq: Every day | ORAL | 3 refills | Status: DC

## 2019-08-28 MED ORDER — CARVEDILOL 6.25 MG PO TABS: 6.2500 mg | ORAL_TABLET | Freq: Two times a day (BID) | ORAL | 3 refills | Status: DC

## 2019-08-28 NOTE — Telephone Encounter (Signed)
New Message    Pt c/o medication issue:  1. Name of Medication: Metoprolol Tartrate 25mg   2. How are you currently taking this medication (dosage and times per day)? 1 tablet by mouth 2 times daily  3. Are you having a reaction (difficulty breathing--STAT)? No  4. What is your medication issue? Patient states BP has been going up since being on medication.  The first 10 days it was 139/67, the next 10 days it was 145/69, and the last 10 days it was 156/76.  His heart rate averages 48.  Please call patient back to discuss.

## 2019-08-28 NOTE — Telephone Encounter (Signed)
Stop metoprolol, start coreg 6.25 mg BID, norvasc 5 mg daily for BP.

## 2019-08-28 NOTE — Telephone Encounter (Signed)
I spoke to the patient and notified him of Dr Camnitz's recommendation.  He verbalized understanding.

## 2019-08-28 NOTE — Telephone Encounter (Signed)
Called David Bush back about his message. David Bush stated he feels great, but his BP keeps going up and his HR is low. David Bush just wanted to touch base with Dr. Curt Bears and his nurse Venida Jarvis to see if they want to keep him on Metoprolol 25 mg BID. Informed David Bush that a message would be sent to Dr. Curt Bears for advisement.

## 2019-09-02 ENCOUNTER — Other Ambulatory Visit: Payer: Self-pay | Admitting: Family Medicine

## 2019-09-11 ENCOUNTER — Telehealth: Payer: Self-pay | Admitting: Cardiology

## 2019-09-11 NOTE — Telephone Encounter (Signed)
Patient was placed on new BP medication a week ago he was calling to give update, he stated he average BP was 139/67 HR 56.

## 2019-09-11 NOTE — Telephone Encounter (Signed)
Pt reports feeling better than he has in a long time.  He is taking his BP after his morning medications, and then again in the evening. Experiencing no issues with low HRs. Advised to continue monitoring BPs.  Call the office if issues begin r/t HRs. Aware I will forward to [facility] for his review and only call back if he had different advisement. Patient verbalized understanding and agreeable to plan.

## 2019-09-12 MED ORDER — AMLODIPINE BESYLATE 10 MG PO TABS: 10.0000 mg | ORAL_TABLET | Freq: Every day | ORAL | 2 refills | Status: DC

## 2019-09-12 NOTE — Telephone Encounter (Signed)
Advised to increase Amlodipine to 10 mg once daily. Advised to call if issues arise after med increase. Rx sent to mail in pharmacy per pt request. Patient verbalized understanding and agreeable to plan.

## 2019-09-12 NOTE — Telephone Encounter (Signed)
Would prefer to have systolic blood pressure less than 130 mmHg on average.  Would increase Norvasc to 10 mg daily.

## 2019-09-27 ENCOUNTER — Telehealth: Payer: Self-pay | Admitting: Cardiology

## 2019-09-27 NOTE — Telephone Encounter (Signed)
Pt not at home, spoke to pt's wife. She tells me that pt only took 5 mg of Norvasc last night but isn't sure if he has been experiencing ankle swelling other than just last night as note below states. Advised to have pt take 10 mg tonight and call back if swelling re-occurs, or if pt has noticed ankle edema for some time now. Wife verbalized understanding and agreeable to plan.

## 2019-09-27 NOTE — Telephone Encounter (Signed)
New Message   Patient is calling because his amlodpoine was increased and was advised to call and provide his reaction to the increase. He states that last night was the only night that he had increase swelling in his ankle but it was back down this morning. His BP was 127/57 (average) and pulse rate 55 (average)/

## 2019-09-28 ENCOUNTER — Ambulatory Visit (INDEPENDENT_AMBULATORY_CARE_PROVIDER_SITE_OTHER): Payer: Medicare Other

## 2019-09-28 ENCOUNTER — Other Ambulatory Visit: Payer: Medicare Other

## 2019-09-28 ENCOUNTER — Other Ambulatory Visit: Payer: Self-pay

## 2019-09-28 DIAGNOSIS — E7800 Pure hypercholesterolemia, unspecified: Secondary | ICD-10-CM | POA: Diagnosis not present

## 2019-09-28 DIAGNOSIS — Z0000 Encounter for general adult medical examination without abnormal findings: Secondary | ICD-10-CM | POA: Diagnosis not present

## 2019-09-28 DIAGNOSIS — Z23 Encounter for immunization: Secondary | ICD-10-CM

## 2019-09-28 DIAGNOSIS — I10 Essential (primary) hypertension: Secondary | ICD-10-CM | POA: Diagnosis not present

## 2019-09-28 DIAGNOSIS — N183 Chronic kidney disease, stage 3 unspecified: Secondary | ICD-10-CM | POA: Diagnosis not present

## 2019-09-28 DIAGNOSIS — I480 Paroxysmal atrial fibrillation: Secondary | ICD-10-CM

## 2019-09-28 DIAGNOSIS — Z87438 Personal history of other diseases of male genital organs: Secondary | ICD-10-CM

## 2019-09-28 DIAGNOSIS — Z79899 Other long term (current) drug therapy: Secondary | ICD-10-CM | POA: Diagnosis not present

## 2019-09-28 DIAGNOSIS — Z125 Encounter for screening for malignant neoplasm of prostate: Secondary | ICD-10-CM | POA: Diagnosis not present

## 2019-09-29 LAB — CBC WITH DIFFERENTIAL/PLATELET
Absolute Monocytes: 474 cells/uL (ref 200–950)
Basophils Absolute: 41 cells/uL (ref 0–200)
Basophils Relative: 0.8 %
Eosinophils Absolute: 184 cells/uL (ref 15–500)
Eosinophils Relative: 3.6 %
HCT: 38 % — ABNORMAL LOW (ref 38.5–50.0)
Hemoglobin: 12.8 g/dL — ABNORMAL LOW (ref 13.2–17.1)
Lymphs Abs: 1051 cells/uL (ref 850–3900)
MCH: 30.6 pg (ref 27.0–33.0)
MCHC: 33.7 g/dL (ref 32.0–36.0)
MCV: 90.9 fL (ref 80.0–100.0)
MPV: 10.1 fL (ref 7.5–12.5)
Monocytes Relative: 9.3 %
Neutro Abs: 3351 cells/uL (ref 1500–7800)
Neutrophils Relative %: 65.7 %
Platelets: 180 10*3/uL (ref 140–400)
RBC: 4.18 10*6/uL — ABNORMAL LOW (ref 4.20–5.80)
RDW: 12.6 % (ref 11.0–15.0)
Total Lymphocyte: 20.6 %
WBC: 5.1 10*3/uL (ref 3.8–10.8)

## 2019-09-29 LAB — COMPREHENSIVE METABOLIC PANEL
AG Ratio: 2 (calc) (ref 1.0–2.5)
ALT: 18 U/L (ref 9–46)
AST: 19 U/L (ref 10–35)
Albumin: 4.1 g/dL (ref 3.6–5.1)
Alkaline phosphatase (APISO): 51 U/L (ref 35–144)
BUN/Creatinine Ratio: 14 (calc) (ref 6–22)
BUN: 22 mg/dL (ref 7–25)
CO2: 27 mmol/L (ref 20–32)
Calcium: 9.4 mg/dL (ref 8.6–10.3)
Chloride: 106 mmol/L (ref 98–110)
Creat: 1.55 mg/dL — ABNORMAL HIGH (ref 0.70–1.11)
Globulin: 2.1 g/dL (calc) (ref 1.9–3.7)
Glucose, Bld: 101 mg/dL — ABNORMAL HIGH (ref 65–99)
Potassium: 4.6 mmol/L (ref 3.5–5.3)
Sodium: 140 mmol/L (ref 135–146)
Total Bilirubin: 0.4 mg/dL (ref 0.2–1.2)
Total Protein: 6.2 g/dL (ref 6.1–8.1)

## 2019-09-29 LAB — LIPID PANEL
Cholesterol: 144 mg/dL (ref <200)
HDL: 38 mg/dL — ABNORMAL LOW (ref >=40)
LDL Cholesterol (Calc): 85 mg/dL (calc)
Non-HDL Cholesterol (Calc): 106 mg/dL (calc) (ref <130)
Total CHOL/HDL Ratio: 3.8 (calc) (ref <5.0)
Triglycerides: 112 mg/dL (ref <150)

## 2019-09-29 LAB — PSA: PSA: 1.4 ng/mL (ref <=4.0)

## 2019-10-01 ENCOUNTER — Other Ambulatory Visit: Payer: Self-pay

## 2019-10-01 ENCOUNTER — Ambulatory Visit (INDEPENDENT_AMBULATORY_CARE_PROVIDER_SITE_OTHER): Payer: Medicare Other | Admitting: Family Medicine

## 2019-10-01 ENCOUNTER — Encounter: Payer: Self-pay | Admitting: Family Medicine

## 2019-10-01 VITALS — BP 132/70 | HR 68 | Temp 97.6°F | Resp 16 | Ht 73.0 in | Wt 204.0 lb

## 2019-10-01 DIAGNOSIS — I10 Essential (primary) hypertension: Secondary | ICD-10-CM | POA: Diagnosis not present

## 2019-10-01 DIAGNOSIS — Z0001 Encounter for general adult medical examination with abnormal findings: Secondary | ICD-10-CM

## 2019-10-01 DIAGNOSIS — N183 Chronic kidney disease, stage 3 unspecified: Secondary | ICD-10-CM | POA: Diagnosis not present

## 2019-10-01 DIAGNOSIS — R710 Precipitous drop in hematocrit: Secondary | ICD-10-CM | POA: Diagnosis not present

## 2019-10-01 DIAGNOSIS — I480 Paroxysmal atrial fibrillation: Secondary | ICD-10-CM | POA: Diagnosis not present

## 2019-10-01 DIAGNOSIS — R6889 Other general symptoms and signs: Secondary | ICD-10-CM | POA: Diagnosis not present

## 2019-10-01 DIAGNOSIS — Z0000 Encounter for general adult medical examination without abnormal findings: Secondary | ICD-10-CM

## 2019-10-01 DIAGNOSIS — E7800 Pure hypercholesterolemia, unspecified: Secondary | ICD-10-CM

## 2019-10-01 NOTE — Progress Notes (Signed)
Subjective:    Patient ID: David Bush, male    DOB: 1934-09-30, 85 y.o.   MRN: [id number]  HPI Patient is here today for complete physical exam.  Patient denies any issues with falls.  He denies any issues with memory loss.  He denies any depression.  He has been taking Eliquis ever since he was found to be in atrial fibrillation.  This was approximately 8 months ago.  Since starting the Eliquis there has been a mild decline in his hemoglobin.  He was 14 initially.  He dropped to 13.  Most recent lab work was down to 12.8.  He denies any blood in stool.  He denies any melena.  His only complaint really is some neuropathy left greater than right around the plantar aspect of his first MTP joint.  He denies any burning or stinging pain but he does have numbness.  He does have some mild vision difficulties however he has an appointment to see the eye doctor in 1 month.  He has a history of bilateral cataract removal.  He is not due for colon cancer screening.  He did have a PSA drawn with his most recent lab work prior to his appointment which was normal at 1.4.  His most recent lab work is listed below.  His immunizations are up-to-date except for his tetanus shot and the shingles vaccine.  He declines a tetanus shot today.  I did encourage the shingles vaccine if reasonably priced. Appointment on 09/28/2019  Component Date Value Ref Range Status  . PSA 09/28/2019 1.4  < OR = 4.0 ng/mL Final   Comment: The total PSA value from this assay system is  standardized against the WHO standard. The test  result will be approximately 20% lower when compared  to the equimolar-standardized total PSA (Beckman  Coulter). Comparison of serial PSA results should be  interpreted with this fact in mind. . This test was performed using the Siemens  chemiluminescent method. Values obtained from  different assay methods cannot be used interchangeably. PSA levels, regardless of value, should not be  interpreted as absolute evidence of the presence or absence of disease.   . Cholesterol 09/28/2019 144  <200 mg/dL Final  . HDL 09/28/2019 38* > OR = 40 mg/dL Final  . Triglycerides 09/28/2019 112  <150 mg/dL Final  . LDL Cholesterol (Calc) 09/28/2019 85  mg/dL (calc) Final   Comment: Reference range: <100 . Desirable range <100 mg/dL for primary prevention;   <70 mg/dL for patients with CHD or diabetic patients  with > or = 2 CHD risk factors. Marland Kitchen LDL-C is now calculated using the Martin-Hopkins  calculation, which is a validated novel method providing  better accuracy than the Friedewald equation in the  estimation of LDL-C.  Cresenciano Genre et al. Annamaria Helling. [id number]): 2061-2068  (http://education.[facility].com/faq/FAQ164)   . Total CHOL/HDL Ratio 09/28/2019 3.8  <5.0 (calc) Final  . Non-HDL Cholesterol (Calc) 09/28/2019 106  <130 mg/dL (calc) Final   Comment: For patients with diabetes plus 1 major ASCVD risk  factor, treating to a non-HDL-C goal of <100 mg/dL  (LDL-C of <70 mg/dL) is considered a therapeutic  option.   . Glucose, Bld 09/28/2019 101* 65 - 99 mg/dL Final   Comment: .            Fasting reference interval . For someone without known diabetes, a glucose value between 100 and 125 mg/dL is consistent with prediabetes and should be confirmed with a follow-up test. .   Marland Kitchen  BUN 09/28/2019 22  7 - 25 mg/dL Final  . Creat 09/28/2019 1.55* 0.70 - 1.11 mg/dL Final   Comment: For patients >72 years of age, the reference limit for Creatinine is approximately 13% higher for people identified as African-American. .   Havery Moros Ratio 09/28/2019 14  6 - 22 (calc) Final  . Sodium 09/28/2019 140  135 - 146 mmol/L Final  . Potassium 09/28/2019 4.6  3.5 - 5.3 mmol/L Final  . Chloride 09/28/2019 106  98 - 110 mmol/L Final  . CO2 09/28/2019 27  20 - 32 mmol/L Final  . Calcium 09/28/2019 9.4  8.6 - 10.3 mg/dL Final  . Total Protein 09/28/2019 6.2  6.1 - 8.1 g/dL Final   . Albumin 09/28/2019 4.1  3.6 - 5.1 g/dL Final  . Globulin 09/28/2019 2.1  1.9 - 3.7 g/dL (calc) Final  . AG Ratio 09/28/2019 2.0  1.0 - 2.5 (calc) Final  . Total Bilirubin 09/28/2019 0.4  0.2 - 1.2 mg/dL Final  . Alkaline phosphatase (APISO) 09/28/2019 51  35 - 144 U/L Final  . AST 09/28/2019 19  10 - 35 U/L Final  . ALT 09/28/2019 18  9 - 46 U/L Final  . WBC 09/28/2019 5.1  3.8 - 10.8 Thousand/uL Final  . RBC 09/28/2019 4.18* 4.20 - 5.80 Million/uL Final  . Hemoglobin 09/28/2019 12.8* 13.2 - 17.1 g/dL Final  . HCT 09/28/2019 38.0* 38.5 - 50.0 % Final  . MCV 09/28/2019 90.9  80.0 - 100.0 fL Final  . MCH 09/28/2019 30.6  27.0 - 33.0 pg Final  . MCHC 09/28/2019 33.7  32.0 - 36.0 g/dL Final  . RDW 09/28/2019 12.6  11.0 - 15.0 % Final  . Platelets 09/28/2019 180  140 - 400 Thousand/uL Final  . MPV 09/28/2019 10.1  7.5 - 12.5 fL Final  . Neutro Abs 09/28/2019 3,351  1,500 - 7,800 cells/uL Final  . Lymphs Abs 09/28/2019 1,051  850 - 3,900 cells/uL Final  . Absolute Monocytes 09/28/2019 474  200 - 950 cells/uL Final  . Eosinophils Absolute 09/28/2019 184  15 - 500 cells/uL Final  . Basophils Absolute 09/28/2019 41  0 - 200 cells/uL Final  . Neutrophils Relative % 09/28/2019 65.7  % Final  . Total Lymphocyte 09/28/2019 20.6  % Final  . Monocytes Relative 09/28/2019 9.3  % Final  . Eosinophils Relative 09/28/2019 3.6  % Final  . Basophils Relative 09/28/2019 0.8  % Final    Past Medical History:  Diagnosis Date  . Atrial fibrillation ([place])   . Barrett's esophagus   . Cataract    surgery to remove bilateral  . Chest pain    in feb 2020/ saw cardiologist- no heart attack!  . CKD (chronic kidney disease) stage 3, GFR 30-59 ml/min   . GERD (gastroesophageal reflux disease)   . Hx of hiatal hernia   . Hyperlipidemia   . Hypertension   . Skin cancer    squamous cell of face/arm/legs  . Substance abuse ([place])    stopped drinking in 1993   Past Surgical History:  Procedure Laterality  Date  . CATARACT EXTRACTION, BILATERAL  2012  . COLONOSCOPY    . KNEE ARTHROSCOPY Left 2010   torn meniscus  . SKIN CANCER EXCISION  2015, 2012   face, arms and legs  . UPPER GASTROINTESTINAL ENDOSCOPY     barrett's esophagus  . [place] EXTRACTION  1980  . WISDOM TOOTH EXTRACTION     Current Outpatient Medications on File Prior  to Visit  Medication Sig Dispense Refill  . acetaminophen (TYLENOL) 500 MG tablet Take 1,000 mg by mouth every 6 (six) hours as needed for moderate pain.    Marland Kitchen amLODipine (NORVASC) 10 MG tablet Take 1 tablet (10 mg total) by mouth daily. 180 tablet 2  . apixaban (ELIQUIS) 5 MG TABS tablet Take 1 tablet (5 mg total) by mouth 2 (two) times daily. 180 tablet 1  . benazepril (LOTENSIN) 40 MG tablet TAKE 1 TABLET DAILY 90 tablet 4  . carvedilol (COREG) 6.25 MG tablet Take 1 tablet (6.25 mg total) by mouth 2 (two) times daily. 180 tablet 3  . finasteride (PROSCAR) 5 MG tablet TAKE 1 TABLET DAILY 90 tablet 3  . flecainide (TAMBOCOR) 100 MG tablet TAKE 1 TABLET TWICE A DAY (STOP MULTAQ) 180 tablet 3  . Multiple Vitamins-Minerals (CENTRUM SILVER PO) Take 1 tablet by mouth daily.    . nitroGLYCERIN (NITROSTAT) 0.4 MG SL tablet Place 1 tablet (0.4 mg total) under the tongue every 5 (five) minutes as needed for chest pain. 25 tablet 3  . pantoprazole (PROTONIX) 40 MG tablet TAKE 1 TABLET DAILY 90 tablet 4  . rosuvastatin (CRESTOR) 20 MG tablet TAKE 1 TABLET DAILY 90 tablet 3   No current facility-administered medications on file prior to visit.    Allergies  Allergen Reactions  . Diltiazem Itching and Rash   Social History   Socioeconomic History  . Marital status: Married    Spouse name: Not on file  . Number of children: Not on file  . Years of education: Not on file  . Highest education level: Not on file  Occupational History  . Not on file  Social Needs  . Financial resource strain: Not on file  . Food insecurity    Worry: Not on file    Inability:  Not on file  . Transportation needs    Medical: Not on file    Non-medical: Not on file  Tobacco Use  . Smoking status: Former Smoker    Years: 16.00    Types: Cigarettes    Quit date: 12/06/1966    Years since quitting: 52.8  . Smokeless tobacco: Never Used  Substance and Sexual Activity  . Alcohol use: No    Alcohol/week: 0.0 standard drinks    Comment: 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  . Drug use: No  . Sexual activity: Not on file  Lifestyle  . Physical activity    Days per week: Not on file    Minutes per session: Not on file  . Stress: Not on file  Relationships  . Social Herbalist on phone: Not on file    Gets together: Not on file    Attends religious service: Not on file    Active member of club or organization: Not on file    Attends meetings of clubs or organizations: Not on file    Relationship status: Not on file  . Intimate partner violence    Fear of current or ex partner: Not on file    Emotionally abused: Not on file    Physically abused: Not on file    Forced sexual activity: Not on file  Other Topics Concern  . Not on file  Social History Narrative  . Not on file   Family History  Problem Relation Age of Onset  . Esophageal cancer Mother   . Stroke Father   . Kidney cancer Sister   . Kidney  disease Brother   . Other Brother   . Heart disease Brother   . Colon cancer Neg Hx   . Colon polyps Neg Hx   . Rectal cancer Neg Hx   . Stomach cancer Neg Hx       Review of Systems  All other systems reviewed and are negative.      Objective:   Physical Exam  Constitutional: He is oriented to person, place, and time. He appears well-developed and well-nourished. No distress.  HENT:  Head: Normocephalic and atraumatic.  Right Ear: External ear normal.  Left Ear: External ear normal.  Nose: Nose normal.  Mouth/Throat: Oropharynx is clear and moist. No oropharyngeal exudate.  Eyes: Pupils are equal, round, and reactive  to light. Conjunctivae and EOM are normal. Right eye exhibits no discharge. Left eye exhibits no discharge. No scleral icterus.  Neck: Normal range of motion. Neck supple. No JVD present. No tracheal deviation present. No thyromegaly present.  Cardiovascular: Normal rate, regular rhythm and intact distal pulses. Exam reveals no gallop and no friction rub.  Murmur heard. Pulmonary/Chest: Effort normal and breath sounds normal. No stridor. No respiratory distress. He has no wheezes. He has no rales. He exhibits no tenderness.  Abdominal: Soft. Bowel sounds are normal. He exhibits no distension and no mass. There is no abdominal tenderness. There is no rebound and no guarding.  Genitourinary:    Prostate, penis and rectum normal.   Musculoskeletal:        General: No deformity or edema.  Lymphadenopathy:    He has no cervical adenopathy.  Neurological: He is alert and oriented to person, place, and time. He has normal reflexes. No cranial nerve deficit. He exhibits normal muscle tone. Coordination normal.  Skin: Skin is warm. No rash noted. He is not diaphoretic. No erythema. No pallor.  Psychiatric: He has a normal mood and affect. His behavior is normal. Judgment and thought content normal.  Vitals reviewed.         Assessment & Plan:  Drop in hemoglobin - Plan: Iron, Vitamin B12, Fecal Globin By Immunochemistry  Routine general medical examination at a health care facility  Benign essential HTN  CRI (chronic renal insufficiency), stage 3 (moderate)  Paroxysmal atrial fibrillation (HCC)  Pure hypercholesterolemia   Patient's murmur is due to mild aortic stenosis.  He is asymptomatic.  Creatinine is up slightly from 1.3-1.5.  This is something I would monitor every 6 months.  Encourage the patient to avoid any NSAIDs and encouraged him also to drink plenty of water.  I do not believe this is clinically significant nor do I believe it will cause him any issues during his lifetime but  we need to watch more closely.  I am concerned by the drop in his hemoglobin.  I recommended checking for B12 deficiency particular given his neuropathy.  I will also check for iron deficiency and check his stool for blood.  If there is blood in his stool, we may need to consult GI to evaluate for an occult loss of blood.  Immunizations are up-to-date except for the shingles vaccine and the tetanus shot which I encouraged.  Cholesterol looks excellent.  The remainder of his lab work looks good.

## 2019-10-02 ENCOUNTER — Other Ambulatory Visit: Payer: Medicare Other

## 2019-10-02 DIAGNOSIS — R710 Precipitous drop in hematocrit: Secondary | ICD-10-CM | POA: Diagnosis not present

## 2019-10-02 LAB — IRON: Iron: 140 ug/dL (ref 50–180)

## 2019-10-02 LAB — VITAMIN B12: Vitamin B-12: 493 pg/mL (ref 200–1100)

## 2019-10-03 ENCOUNTER — Other Ambulatory Visit: Payer: Self-pay | Admitting: Cardiology

## 2019-10-03 LAB — FECAL GLOBIN BY IMMUNOCHEMISTRY
FECAL GLOBIN RESULT:: NOT DETECTED
MICRO NUMBER:: 1034853
SPECIMEN QUALITY:: ADEQUATE

## 2019-10-03 NOTE — Telephone Encounter (Signed)
Eliquis 5mg  refill request received, pt is 85 yrs old, weight-92.5kg, Crea-1.55 on 09/28/2019, Diagnosis-Afib, and last seen by Dr. Curt Bears on 07/03/2019. Dose is not appropriate based on dosing criteria. Will send a message to Dr. Curt Bears regarding this.

## 2019-10-08 ENCOUNTER — Telehealth: Payer: Self-pay | Admitting: *Deleted

## 2019-10-08 MED ORDER — APIXABAN 2.5 MG PO TABS: 2.5000 mg | ORAL_TABLET | Freq: Two times a day (BID) | ORAL | 2 refills | Status: DC

## 2019-10-08 NOTE — Telephone Encounter (Signed)
-----   Message from Stanton Kidney, RN sent at 10/08/2019  4:23 PM EST -----  ----- Message ----- From: Constance Haw, MD Sent: 10/03/2019  12:53 PM EST To: Stanton Kidney, RN  Can we make this adjustment? Seems reasonable. Thanks! ----- Message ----- From: Marcos Eke, RN Sent: 10/03/2019   8:03 AM EDT To: Constance Haw, MD  Dr. Curt Bears, The pt has requested a refill on Eliquis 5mg . The pt is 85 yrs old, wt-92.5kg, Crea-1.55 on 09/28/2019; per dosing criteria he is not on the correct dose. I looked at his creatinine trends and he has been up and down: 09/22/2018-1.30, 03/17/18-1.52, 01/31/18-1.57, 01/11/2019-1.36, 05/07/2019-1.33, and 09/28/2019-1.55; please advise on dosing.  Thank you,  Derrel Nip, RN, BSN

## 2019-10-08 NOTE — Telephone Encounter (Signed)
Per Dr. Curt Bears the Eliquis dose can be adjusted to Eliquis 2.5mg  BID due to dosing criteria. Pt is 85 yrs old, wt-92.5kg, Crea-1.55 on 09/28/2019. Will send in the Eliquis 2.5mg  BID refill at this time and update the pt.  Called the pt to update him on the dose change of Eliquis and he verbalized understanding of the dose change and the reason he needs the 2.5mg  dose and not the 5mg  dose. Pt stated he had some Eliquis 5mg s left and he is aware that he will start cutting them in half to get 2.5mg  dose twice a day. He stated he has done this before. Advised the pt that I am sending in the Eliquis 2.5mg  dose now to his [facility] and he verbalized understanding that his dose will be Eliquis 2.5mg  BID.

## 2019-10-08 NOTE — Telephone Encounter (Signed)
Dose changed from Eliquis 5mg  to 2.5mg  BID. The 2.5mg  dose was sent today and pt is aware of the dose change. Will deny this Eliquis 5mg  tablet.

## 2019-11-20 ENCOUNTER — Other Ambulatory Visit: Payer: Self-pay | Admitting: Family Medicine

## 2019-12-14 ENCOUNTER — Other Ambulatory Visit: Payer: Self-pay | Admitting: Family Medicine

## 2019-12-25 DIAGNOSIS — C44529 Squamous cell carcinoma of skin of other part of trunk: Secondary | ICD-10-CM | POA: Diagnosis not present

## 2019-12-25 DIAGNOSIS — L821 Other seborrheic keratosis: Secondary | ICD-10-CM | POA: Diagnosis not present

## 2019-12-25 DIAGNOSIS — D045 Carcinoma in situ of skin of trunk: Secondary | ICD-10-CM | POA: Diagnosis not present

## 2019-12-31 ENCOUNTER — Ambulatory Visit: Payer: Medicare Other | Attending: Internal Medicine

## 2019-12-31 DIAGNOSIS — Z23 Encounter for immunization: Secondary | ICD-10-CM | POA: Insufficient documentation

## 2019-12-31 NOTE — Progress Notes (Signed)
   Covid-19 Vaccination Clinic  Name:  David Bush    MRN: [id number] DOB: Sep 07, 1934  12/31/2019  Mr. Quiggle was observed post Covid-19 immunization for 15 minutes without incidence. He was provided with Vaccine Information Sheet and instruction to access the V-Safe system.   Mr. Honkomp was instructed to call 911 with any severe reactions post vaccine: Marland Kitchen Difficulty breathing  . Swelling of your face and throat  . A fast heartbeat  . A bad rash all over your body  . Dizziness and weakness    Immunizations Administered    Name Date Dose VIS Date Route   Pfizer COVID-19 Vaccine 12/31/2019 10:12 AM 0.3 mL 11/16/2019 Intramuscular   Manufacturer: [place]   Lot: [id number]   [place]: [id number]

## 2020-01-02 DIAGNOSIS — C44622 Squamous cell carcinoma of skin of right upper limb, including shoulder: Secondary | ICD-10-CM | POA: Diagnosis not present

## 2020-01-02 DIAGNOSIS — L309 Dermatitis, unspecified: Secondary | ICD-10-CM | POA: Diagnosis not present

## 2020-01-02 DIAGNOSIS — D485 Neoplasm of uncertain behavior of skin: Secondary | ICD-10-CM | POA: Diagnosis not present

## 2020-01-17 DIAGNOSIS — C44622 Squamous cell carcinoma of skin of right upper limb, including shoulder: Secondary | ICD-10-CM | POA: Diagnosis not present

## 2020-01-21 ENCOUNTER — Ambulatory Visit: Payer: Medicare Other | Attending: Internal Medicine

## 2020-01-21 DIAGNOSIS — Z23 Encounter for immunization: Secondary | ICD-10-CM | POA: Insufficient documentation

## 2020-01-21 NOTE — Progress Notes (Signed)
   Covid-19 Vaccination Clinic  Name:  David Bush    MRN: [id number] DOB: 25-Feb-1934  01/21/2020  [name] was observed post Covid-19 immunization for 15 minutes without incidence. He was provided with Vaccine Information Sheet and instruction to access the V-Safe system.   [name] was instructed to call 911 with any severe reactions post vaccine: Marland Kitchen Difficulty breathing  . Swelling of your face and throat  . A fast heartbeat  . A bad rash all over your body  . Dizziness and weakness    Immunizations Administered    Name Date Dose VIS Date Route   Pfizer COVID-19 Vaccine 01/21/2020 10:01 AM 0.3 mL 11/16/2019 Intramuscular   Manufacturer: [place]   Lot: [id number]   [place]: [id number]

## 2020-01-29 ENCOUNTER — Ambulatory Visit (INDEPENDENT_AMBULATORY_CARE_PROVIDER_SITE_OTHER): Payer: Medicare Other | Admitting: Cardiology

## 2020-01-29 ENCOUNTER — Encounter: Payer: Self-pay | Admitting: Cardiology

## 2020-01-29 ENCOUNTER — Other Ambulatory Visit: Payer: Self-pay

## 2020-01-29 VITALS — BP 120/68 | HR 64 | Ht 73.0 in | Wt 210.8 lb

## 2020-01-29 DIAGNOSIS — I480 Paroxysmal atrial fibrillation: Secondary | ICD-10-CM | POA: Diagnosis not present

## 2020-01-29 NOTE — Progress Notes (Signed)
Electrophysiology Office Note   Date:  01/29/2020   ID:  David, Bush 08-31-1934, MRN [id number]  PCP:  David Frizzle, MD  Cardiologist:   Primary Electrophysiologist:  David Fassler Meredith Leeds, MD    No chief complaint on file.    History of Present Illness: David Bush is a 86 y.o. male who is being seen today for the evaluation of bradycardia, palpitations at the request of David Frizzle, MD. Presenting today for electrophysiology evaluation.  He has a history of Barrett's esophagus, CKD stage III, hypertension, hyperlipidemia.  His medications have been adjusted due to his CKD for blood pressure.  Multaq was stopped at the last visit and he was started on flecainide.  Today, denies symptoms of palpitations, chest pain, shortness of breath, orthopnea, PND, lower extremity edema, claudication, dizziness, presyncope, syncope, bleeding, or neurologic sequela. The patient is tolerating medications without difficulties.  Last summer he complained of quite a bit of dizziness.  Since the weather has gotten cold, his dizziness has stopped.  He is able to do all of his daily activities without restriction.  Past Medical History:  Diagnosis Date  . Atrial fibrillation ([place])   . Barrett's esophagus   . Cataract    surgery to remove bilateral  . Chest pain    in feb 2020/ saw cardiologist- no heart attack!  . CKD (chronic kidney disease) stage 3, GFR 30-59 ml/min   . GERD (gastroesophageal reflux disease)   . Hx of hiatal hernia   . Hyperlipidemia   . Hypertension   . Skin cancer    squamous cell of face/arm/legs  . Substance abuse ([place])    stopped drinking in 1993   Past Surgical History:  Procedure Laterality Date  . CATARACT EXTRACTION, BILATERAL  2012  . COLONOSCOPY    . KNEE ARTHROSCOPY Left 2010   torn meniscus  . SKIN CANCER EXCISION  2015, 2012   face, arms and legs  . UPPER GASTROINTESTINAL ENDOSCOPY     barrett's esophagus  . [place]  EXTRACTION  1980  . WISDOM TOOTH EXTRACTION       Current Outpatient Medications  Medication Sig Dispense Refill  . acetaminophen (TYLENOL) 500 MG tablet Take 1,000 mg by mouth every 6 (six) hours as needed for moderate pain.    Marland Kitchen amLODipine (NORVASC) 10 MG tablet Take 1 tablet (10 mg total) by mouth daily. 180 tablet 2  . apixaban (ELIQUIS) 2.5 MG TABS tablet Take 1 tablet (2.5 mg total) by mouth 2 (two) times daily. 180 tablet 2  . benazepril (LOTENSIN) 40 MG tablet TAKE 1 TABLET DAILY 90 tablet 3  . carvedilol (COREG) 6.25 MG tablet Take 1 tablet (6.25 mg total) by mouth 2 (two) times daily. 180 tablet 3  . finasteride (PROSCAR) 5 MG tablet TAKE 1 TABLET DAILY 90 tablet 3  . flecainide (TAMBOCOR) 100 MG tablet TAKE 1 TABLET TWICE A DAY (STOP MULTAQ) 180 tablet 3  . Multiple Vitamins-Minerals (CENTRUM SILVER PO) Take 1 tablet by mouth daily.    . nitroGLYCERIN (NITROSTAT) 0.4 MG SL tablet Place 1 tablet (0.4 mg total) under the tongue every 5 (five) minutes as needed for chest pain. 25 tablet 3  . pantoprazole (PROTONIX) 40 MG tablet TAKE 1 TABLET DAILY 90 tablet 3  . rosuvastatin (CRESTOR) 20 MG tablet TAKE 1 TABLET DAILY 90 tablet 3   No current facility-administered medications for this visit.    Allergies:   Diltiazem   Social History:  The patient  reports that he quit smoking about 53 years ago. His smoking use included cigarettes. He quit after 16.00 years of use. He has never used smokeless tobacco. He reports that he does not drink alcohol or use drugs.   Family History:  The patient's family history includes Esophageal cancer in his mother; Heart disease in his brother; Kidney cancer in his sister; Kidney disease in his brother; Other in his brother; Stroke in his father.   ROS:  Please see the history of present illness.   Otherwise, review of systems is positive for none.   All other systems are reviewed and negative.   PHYSICAL EXAM: VS:  BP 120/68   Pulse 64   Ht 6'  1" (1.854 m)   Wt 210 lb 12.8 oz (95.6 kg)   SpO2 98%   BMI 27.81 kg/m  , BMI Body mass index is 27.81 kg/m. GEN: Well nourished, well developed, in no acute distress  HEENT: normal  Neck: no JVD, carotid bruits, or masses Cardiac: RRR; no murmurs, rubs, or gallops,no edema  Respiratory:  clear to auscultation bilaterally, normal work of breathing GI: soft, nontender, nondistended, + BS MS: no deformity or atrophy  Skin: warm and dry Neuro:  Strength and sensation are intact Psych: euthymic mood, full affect  EKG:  EKG is ordered today. Personal review of the ekg ordered shows sinus rhythm, rate 54  Recent Labs: 09/28/2019: ALT 18; BUN 22; Creat 1.55; Hemoglobin 12.8; Platelets 180; Potassium 4.6; Sodium 140    Lipid Panel     Component Value Date/Time   CHOL 144 09/28/2019 0804   TRIG 112 09/28/2019 0804   HDL 38 (L) 09/28/2019 0804   CHOLHDL 3.8 09/28/2019 0804   VLDL 42 (H) 08/20/2016 1105   LDLCALC 85 09/28/2019 0804     Wt Readings from Last 3 Encounters:  01/29/20 210 lb 12.8 oz (95.6 kg)  10/01/19 204 lb (92.5 kg)  07/03/19 204 lb 3.2 oz (92.6 kg)      Other studies Reviewed: Additional studies/ records that were reviewed today include: Epic notes   Holter monitor 01/05/18-personally reviewed Minimum heart rate 41 bpm at 2:25 AM Heart rate 144 bpm 6:05 PM Average heart rate 54 bpm Sinus rhythm with APCs and PVCs Wide-complex tachycardia, irregular, likely atrial fibrillation  ASSESSMENT AND PLAN:  1.  Hypertension: Currently well controlled  2.  Hyperlipidemia: Continue Zocor  3. Paroxysmal atrial fibrillation: Currently on flecainide, Eliquis, diltiazem.  CHA2DS2-VASc of 3.  Has remained in sinus rhythm without issue.  No changes at this time.  4.  Dizziness: This could have been due to dehydration in the hot weather.  He is not been dizzy since it is gotten cold.  We David Bush continue to monitor.    Current medicines are reviewed at length with the  patient today.   The patient does not have concerns regarding his medicines.  The following changes were made today: None  Labs/ tests ordered today include:  Orders Placed This Encounter  Procedures  . EKG 12-Lead     Disposition:   FU with Teyanna Thielman 6 months  Signed, Doaa Kendzierski Meredith Leeds, MD  01/29/2020 8:49 AM     [facility] [address] [place] [address] [phone] (office) [phone] (fax)

## 2020-01-29 NOTE — Patient Instructions (Signed)
Medication Instructions:  Your physician recommends that you continue on your current medications as directed. Please refer to the Current Medication list given to you today.  *If you need a refill on your cardiac medications before your next appointment, please call your pharmacy*  Lab Work: None ordered If you have labs (blood work) drawn today and your tests are completely normal, you will receive your results only by: Marland Kitchen MyChart Message (if you have MyChart) OR . A paper copy in the mail If you have any lab test that is abnormal or we need to change your treatment, we will call you to review the results.  Testing/Procedures: None ordered  Follow-Up: At [facility], you and your health needs are our priority.  As part of our continuing mission to provide you with exceptional heart care, we have created designated Provider Care Teams.  These Care Teams include your primary Cardiologist (physician) and Advanced Practice Providers (APPs -  Physician Assistants and Nurse Practitioners) who all work together to provide you with the care you need, when you need it.  Your next appointment:   6 month(s)  The format for your next appointment:   In Person  Provider:   You may see Dr. Curt Bears or one of the following Advanced Practice Providers on your designated Care Team:    Chanetta Marshall, NP  Tommye Standard, PA-C  Legrand Como "[place]" [place], [place]

## 2020-01-31 DIAGNOSIS — H26492 Other secondary cataract, left eye: Secondary | ICD-10-CM | POA: Diagnosis not present

## 2020-02-06 DIAGNOSIS — C44319 Basal cell carcinoma of skin of other parts of face: Secondary | ICD-10-CM | POA: Diagnosis not present

## 2020-02-13 DIAGNOSIS — Z23 Encounter for immunization: Secondary | ICD-10-CM | POA: Diagnosis not present

## 2020-02-13 DIAGNOSIS — T8189XA Other complications of procedures, not elsewhere classified, initial encounter: Secondary | ICD-10-CM | POA: Diagnosis not present

## 2020-03-04 DIAGNOSIS — D485 Neoplasm of uncertain behavior of skin: Secondary | ICD-10-CM | POA: Diagnosis not present

## 2020-03-04 DIAGNOSIS — L570 Actinic keratosis: Secondary | ICD-10-CM | POA: Diagnosis not present

## 2020-03-04 DIAGNOSIS — L0889 Other specified local infections of the skin and subcutaneous tissue: Secondary | ICD-10-CM | POA: Diagnosis not present

## 2020-03-13 ENCOUNTER — Other Ambulatory Visit: Payer: Self-pay | Admitting: Cardiology

## 2020-03-18 ENCOUNTER — Telehealth: Payer: Self-pay | Admitting: Cardiology

## 2020-03-18 NOTE — Telephone Encounter (Signed)
New Message      Pt c/o swelling: STAT is pt has developed SOB within 24 hours  1) How much weight have you gained and in what time span? No   2) If swelling, where is the swelling located? Both ankles but the right one is the worst   3) Are you currently taking a fluid pill? No   4) Are you currently SOB? No   5) Do you have a log of your daily weights (if so, list)? No   6) Have you gained 3 pounds in a day or 5 pounds in a week? No   7) Have you traveled recently? No    Pt is calling and says Dr Curt Bears told him if his ankles start to swell, to call the office   Please call

## 2020-03-18 NOTE — Telephone Encounter (Signed)
The patient reports swelling in both of his feet after playing golf today. It is not the first occurrence, but it is "as big as they have ever been today." The swelling is worse R>L, and this is normal for him. He denies any redness and heat.  He has not gained weight and he denies SOB. BP today 124/57, HR 58.  He is strict with salt intake and "doesn't touch a salt shaker." He is taking amlodipine 10 mg daily as directed.  Advised the patient to continue to limit salt and to elevate his feet this afternoon. He understands he will be called with recommendations.

## 2020-03-19 NOTE — Telephone Encounter (Signed)
Informed pt Dr. Curt Bears recommended stopping Norvasc, starting HCTZ 25 mg daily.  Pt reports that he is hesitant to try something different as this has worked so well for him. Pt aware I will re discuss plan w/ Camnitz.  Aware I will be in touch and is agreeable to plan.

## 2020-03-21 DIAGNOSIS — C44622 Squamous cell carcinoma of skin of right upper limb, including shoulder: Secondary | ICD-10-CM | POA: Diagnosis not present

## 2020-03-21 DIAGNOSIS — D485 Neoplasm of uncertain behavior of skin: Secondary | ICD-10-CM | POA: Diagnosis not present

## 2020-03-21 NOTE — Telephone Encounter (Signed)
Aware Dr. Curt Bears is agreeable to keeping things as they are if pt prefers. Pt can continue to monitor for now and let us know when/if he is ready to change medications.  Pt reports the swelling really isn't that bad and would prefer to remain on Amlodipine for now.  He will call back if worsens and/or he would like to change to HCTZ.  Pt appreciates the follow up call and discussion.

## 2020-04-16 ENCOUNTER — Encounter: Payer: Self-pay | Admitting: Family Medicine

## 2020-04-16 DIAGNOSIS — C44622 Squamous cell carcinoma of skin of right upper limb, including shoulder: Secondary | ICD-10-CM | POA: Diagnosis not present

## 2020-05-19 DIAGNOSIS — L859 Epidermal thickening, unspecified: Secondary | ICD-10-CM | POA: Diagnosis not present

## 2020-05-19 DIAGNOSIS — L570 Actinic keratosis: Secondary | ICD-10-CM | POA: Diagnosis not present

## 2020-05-19 DIAGNOSIS — L578 Other skin changes due to chronic exposure to nonionizing radiation: Secondary | ICD-10-CM | POA: Diagnosis not present

## 2020-05-19 DIAGNOSIS — D485 Neoplasm of uncertain behavior of skin: Secondary | ICD-10-CM | POA: Diagnosis not present

## 2020-05-19 DIAGNOSIS — C44629 Squamous cell carcinoma of skin of left upper limb, including shoulder: Secondary | ICD-10-CM | POA: Diagnosis not present

## 2020-06-13 ENCOUNTER — Encounter (HOSPITAL_COMMUNITY): Payer: Self-pay | Admitting: Emergency Medicine

## 2020-06-13 ENCOUNTER — Emergency Department (HOSPITAL_COMMUNITY): Payer: Medicare Other

## 2020-06-13 ENCOUNTER — Emergency Department (HOSPITAL_COMMUNITY)
Admission: EM | Admit: 2020-06-13 | Discharge: 2020-06-13 | Disposition: A | Discharge status: Home / Self Care | Payer: Medicare Other | Attending: Emergency Medicine | Admitting: Emergency Medicine

## 2020-06-13 DIAGNOSIS — K753 Granulomatous hepatitis, not elsewhere classified: Secondary | ICD-10-CM | POA: Diagnosis not present

## 2020-06-13 DIAGNOSIS — Z7901 Long term (current) use of anticoagulants: Secondary | ICD-10-CM | POA: Diagnosis not present

## 2020-06-13 DIAGNOSIS — N183 Chronic kidney disease, stage 3 unspecified: Secondary | ICD-10-CM | POA: Insufficient documentation

## 2020-06-13 DIAGNOSIS — Z87891 Personal history of nicotine dependence: Secondary | ICD-10-CM | POA: Insufficient documentation

## 2020-06-13 DIAGNOSIS — K7689 Other specified diseases of liver: Secondary | ICD-10-CM | POA: Diagnosis not present

## 2020-06-13 DIAGNOSIS — Z79899 Other long term (current) drug therapy: Secondary | ICD-10-CM | POA: Insufficient documentation

## 2020-06-13 DIAGNOSIS — I10 Essential (primary) hypertension: Secondary | ICD-10-CM | POA: Diagnosis not present

## 2020-06-13 DIAGNOSIS — Z85828 Personal history of other malignant neoplasm of skin: Secondary | ICD-10-CM | POA: Diagnosis not present

## 2020-06-13 DIAGNOSIS — I700 Atherosclerosis of aorta: Secondary | ICD-10-CM | POA: Diagnosis not present

## 2020-06-13 DIAGNOSIS — I129 Hypertensive chronic kidney disease with stage 1 through stage 4 chronic kidney disease, or unspecified chronic kidney disease: Secondary | ICD-10-CM | POA: Insufficient documentation

## 2020-06-13 DIAGNOSIS — R109 Unspecified abdominal pain: Secondary | ICD-10-CM | POA: Diagnosis present

## 2020-06-13 DIAGNOSIS — R1032 Left lower quadrant pain: Secondary | ICD-10-CM | POA: Diagnosis not present

## 2020-06-13 DIAGNOSIS — K5730 Diverticulosis of large intestine without perforation or abscess without bleeding: Secondary | ICD-10-CM | POA: Diagnosis not present

## 2020-06-13 DIAGNOSIS — M7918 Myalgia, other site: Secondary | ICD-10-CM | POA: Diagnosis not present

## 2020-06-13 LAB — URINALYSIS, ROUTINE W REFLEX MICROSCOPIC
Bilirubin Urine: NEGATIVE
Glucose, UA: NEGATIVE mg/dL
Hgb urine dipstick: NEGATIVE
Ketones, ur: NEGATIVE mg/dL
Leukocytes,Ua: NEGATIVE
Nitrite: NEGATIVE
Protein, ur: NEGATIVE mg/dL
Specific Gravity, Urine: 1.014 (ref 1.005–1.030)
pH: 7 (ref 5.0–8.0)

## 2020-06-13 LAB — BASIC METABOLIC PANEL
Anion gap: 11 (ref 5–15)
BUN: 22 mg/dL (ref 8–23)
CO2: 23 mmol/L (ref 22–32)
Calcium: 9.4 mg/dL (ref 8.9–10.3)
Chloride: 103 mmol/L (ref 98–111)
Creatinine, Ser: 1.41 mg/dL — ABNORMAL HIGH (ref 0.61–1.24)
GFR calc Af Amer: 53 mL/min — ABNORMAL LOW (ref >60)
GFR calc non Af Amer: 45 mL/min — ABNORMAL LOW (ref >60)
Glucose, Bld: 123 mg/dL — ABNORMAL HIGH (ref 70–99)
Potassium: 4.2 mmol/L (ref 3.5–5.1)
Sodium: 137 mmol/L (ref 135–145)

## 2020-06-13 LAB — CBC
HCT: 41.3 % (ref 39.0–52.0)
Hemoglobin: 14.1 g/dL (ref 13.0–17.0)
MCH: 31.3 pg (ref 26.0–34.0)
MCHC: 34.1 g/dL (ref 30.0–36.0)
MCV: 91.8 fL (ref 80.0–100.0)
Platelets: 199 10*3/uL (ref 150–400)
RBC: 4.5 MIL/uL (ref 4.22–5.81)
RDW: 12.2 % (ref 11.5–15.5)
WBC: 7.5 10*3/uL (ref 4.0–10.5)
nRBC: 0 % (ref 0.0–0.2)

## 2020-06-13 NOTE — ED Triage Notes (Signed)
Per pt, states left flank pain on and off for months-slowly getting worse-no dysuria, no N/V/D

## 2020-06-13 NOTE — ED Provider Notes (Signed)
[facility] DEPT Provider Note   CSN: [id number] Arrival date & time: 06/13/20  7353     History Chief Complaint  Patient presents with   Flank Pain    David Bush is a 86 y.o. male.  HPI   Patient is an 86 year old male with a medical history as noted below.  Patient states 2 to 3 months ago began experiencing mild pain along the left lateral abdomen and flank.  Initially, his pain only presented with movement and when doing activities in the yard.  He went to the gym "for the first time in a long time" 7 days ago.  Beginning 6 days ago he feels as if his pain has become more constant now. He still feels that is worsens with movement, particularly twisting. He denies any fevers, chills, n/v/d/c, dysuria, hematuria, syncope, CP, SOB. Pt states that he has a history of CKD and is "concerned that it is near his kidneys".      Past Medical History:  Diagnosis Date   Atrial fibrillation ([place])    Barrett's esophagus    Cataract    surgery to remove bilateral   Chest pain    in feb 2020/ saw cardiologist- no heart attack!   CKD (chronic kidney disease) stage 3, GFR 30-59 ml/min    GERD (gastroesophageal reflux disease)    Hx of hiatal hernia    Hyperlipidemia    Hypertension    Skin cancer    squamous cell of face/arm/legs   Substance abuse ([place])    stopped drinking in 1993    Patient Active Problem List   Diagnosis Date Noted   Barrett's esophagus 09/21/2016   HTN (hypertension) 09/21/2016   HLD (hyperlipidemia) 09/21/2016   Cataracts, both eyes 09/21/2016    Past Surgical History:  Procedure Laterality Date   CATARACT EXTRACTION, BILATERAL  2012   COLONOSCOPY     KNEE ARTHROSCOPY Left 2010   torn meniscus   SKIN CANCER EXCISION  2015, 2012   face, arms and legs   UPPER GASTROINTESTINAL ENDOSCOPY     barrett's esophagus   WISDOM TOOTH EXTRACTION  1980   WISDOM TOOTH EXTRACTION         Family  History  Problem Relation Age of Onset   Esophageal cancer Mother    Stroke Father    Kidney cancer Sister    Kidney disease Brother    Other Brother    Heart disease Brother    Colon cancer Neg Hx    Colon polyps Neg Hx    Rectal cancer Neg Hx    Stomach cancer Neg Hx     Social History   Tobacco Use   Smoking status: Former Smoker    Years: 16.00    Types: Cigarettes    Quit date: 12/06/1966    Years since quitting: 53.5   Smokeless tobacco: Never Used  Vaping Use   Vaping Use: Never used  Substance Use Topics   Alcohol use: No    Alcohol/week: 0.0 standard drinks    Comment: 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   Drug use: No    Home Medications Prior to Admission medications   Medication Sig Start Date End Date Taking? Authorizing Provider  acetaminophen (TYLENOL) 500 MG tablet Take 1,000 mg by mouth every 6 (six) hours as needed for moderate pain.    [provider]  amLODipine (NORVASC) 10 MG tablet Take 1 tablet (10 mg total) by mouth daily. 09/12/19  Camnitz, Will Hassell Done, MD  apixaban (ELIQUIS) 2.5 MG TABS tablet Take 1 tablet (2.5 mg total) by mouth 2 (two) times daily. 10/08/19   Camnitz, Will Hassell Done, MD  benazepril (LOTENSIN) 40 MG tablet TAKE 1 TABLET DAILY 12/14/19   [name], MD  carvedilol (COREG) 6.25 MG tablet Take 1 tablet (6.25 mg total) by mouth 2 (two) times daily. 08/28/19   Camnitz, Will Hassell Done, MD  finasteride (PROSCAR) 5 MG tablet TAKE 1 TABLET DAILY 07/16/19   [name], MD  flecainide (TAMBOCOR) 100 MG tablet Take 1 tablet (100 mg total) by mouth 2 (two) times daily. 03/13/20   Camnitz, Ocie Doyne, MD  Multiple Vitamins-Minerals (CENTRUM SILVER PO) Take 1 tablet by mouth daily.    [provider]  nitroGLYCERIN (NITROSTAT) 0.4 MG SL tablet Place 1 tablet (0.4 mg total) under the tongue every 5 (five) minutes as needed for chest pain. 01/15/19   Leanor Kail, PA  pantoprazole (PROTONIX) 40  MG tablet TAKE 1 TABLET DAILY 11/20/19   [name], MD  rosuvastatin (CRESTOR) 20 MG tablet TAKE 1 TABLET DAILY 09/03/19   [name], MD    Allergies    Diltiazem  Review of Systems   Review of Systems  All other systems reviewed and are negative. Ten systems reviewed and are negative for acute change, except as noted in the HPI.    Physical Exam Updated Vital Signs BP 137/77 (BP Location: Right Arm)    Pulse 83    Temp 97.8 F (36.6 C) (Oral)    Resp 16    SpO2 98%   Physical Exam Vitals and nursing note reviewed.  Constitutional:      General: He is not in acute distress.    Appearance: Normal appearance. He is not ill-appearing, toxic-appearing or diaphoretic.  HENT:     Head: Normocephalic and atraumatic.     Right Ear: External ear normal.     Left Ear: External ear normal.     Nose: Nose normal.     Mouth/Throat:     Mouth: Mucous membranes are moist.     Pharynx: Oropharynx is clear. No oropharyngeal exudate or posterior oropharyngeal erythema.  Eyes:     Extraocular Movements: Extraocular movements intact.  Cardiovascular:     Rate and Rhythm: Normal rate and regular rhythm.     Pulses: Normal pulses.     Heart sounds: Normal heart sounds. No murmur heard.  No friction rub. No gallop.      Comments: 2+ radial pulses.  Palpable pedal pulses.  Patient is not tachycardic. Pulmonary:     Effort: Pulmonary effort is normal. No respiratory distress.     Breath sounds: Normal breath sounds. No stridor. No wheezing, rhonchi or rales.  Abdominal:     General: Abdomen is flat. Bowel sounds are normal. There is no distension.     Palpations: Abdomen is soft. There is no mass.     Tenderness: There is abdominal tenderness. There is left CVA tenderness. There is no right CVA tenderness, guarding or rebound.     Hernia: No hernia is present.     Comments: Very mild TTP noted along the central left lateral abdomen and left flank.  Abdomen is soft.  No rebound.    Musculoskeletal:        General: Normal range of motion.     Cervical back: Normal range of motion and neck supple. No tenderness.     Comments: No midline C, T,  L-spine tenderness.  Skin:    General: Skin is warm and dry.  Neurological:     General: No focal deficit present.     Mental Status: He is alert and oriented to person, place, and time.  Psychiatric:        Mood and Affect: Mood normal.        Behavior: Behavior normal.    ED Results / Procedures / Treatments   Labs (all labs ordered are listed, but only abnormal results are displayed) Labs Reviewed  BASIC METABOLIC PANEL - Abnormal; Notable for the following components:      Result Value   Glucose, Bld 123 (*)    Creatinine, Ser 1.41 (*)    GFR calc non Af Amer 45 (*)    GFR calc Af Amer 53 (*)    All other components within normal limits  URINALYSIS, ROUTINE W REFLEX MICROSCOPIC  CBC   EKG EKG Interpretation  Date/Time:  Friday June 13 2020 10:34:13 EDT Ventricular Rate:  76 PR Interval:    QRS Duration: 106 QT Interval:  399 QTC Calculation: 449 R Axis:   -62 Text Interpretation: Sinus rhythm Prolonged PR interval LAD, consider left anterior fascicular block Confirmed by Davonna Belling [phone]) on 06/13/2020 12:28:57 PM  Radiology CT Renal Stone Study  Result Date: 06/13/2020 CLINICAL DATA:  Left flank pain EXAM: CT ABDOMEN AND PELVIS WITHOUT CONTRAST TECHNIQUE: Multidetector CT imaging of the abdomen and pelvis was performed following the standard protocol without IV contrast. COMPARISON:  August 14, 2016 FINDINGS: Lower chest: No acute abnormality. Hepatobiliary: Calcified granuloma is identified in the left lobe liver. The liver is otherwise unremarkable. The gallbladder and biliary tree are normal. Pancreas: Unremarkable. No pancreatic ductal dilatation or surrounding inflammatory changes. Spleen: Normal in size without focal abnormality. Adrenals/Urinary Tract: Adrenal glands are unremarkable. Kidneys  are normal, without renal calculi, focal lesion, or hydronephrosis. Bladder is unremarkable. Stomach/Bowel: Stomach is within normal limits. Appendix appears normal. No evidence of bowel wall thickening, distention, or inflammatory changes. Diverticulosis of colon without diverticulitis. Vascular/Lymphatic: Aortic atherosclerosis. No enlarged abdominal or pelvic lymph nodes. Reproductive: Prostate calcifications are noted. Other: None. Musculoskeletal: Degenerative joint changes of the spine are identified. IMPRESSION: 1. No nephrolithiasis or hydroureteronephrosis bilaterally. 2. No acute abnormality identified in the abdomen and pelvis. 3. Aortic atherosclerosis. Aortic Atherosclerosis (ICD10-I70.0). Electronically Signed   By: Abelardo Diesel M.D.   On: 06/13/2020 11:11    Procedures Procedures   Medications Ordered in ED Medications - No data to display  ED Course  I have reviewed the triage vital signs and the nursing notes.  Pertinent labs & imaging results that were available during my care of the patient were reviewed by me and considered in my medical decision making (see chart for details).  Clinical Course as of Jun 13 1228  Fri Jun 13, 2020  1101 Similar to prior baseline values  Creatinine(!): 1.41 [LJ]  1101 No leukocytosis  WBC: 7.5 [LJ]  1116 No hydronephrosis or findings consistent with kidney stone.  No acute abnormalities identified.  CT Renal Stone Study [LJ]  1220 Benign  Urinalysis, Routine w reflex microscopic- may I&O cath if menses [LJ]    Clinical Course User Index [LJ] Rayna Sexton, PA-C   MDM Rules/Calculators/A&P                          Pt is a 86 y.o. male that present with a history, physical exam, ED Clinical Course  as noted above.   Patient's physical exam is reassuring. No zoster rash visualized.  Creatinine is near patient's baseline.  He is not tachycardic.  Afebrile.  UA is benign.  No leukocytosis.  CT showed no acute abnormalities.  Patient  symptoms seem musculoskeletal in nature.  Discussed this in detail with the patient.  Recommended continued use of APAP for pain management, given his renal function.  Recommended multiple topical analgesics.  If his symptoms do not improve recommend that he follow-up with his PCP.  If they worsen, he needs to return to the emergency department.  His questions were answered and he was amicable to time of discharge.  His vital signs are stable.  Patient discharged to home/self care.  Condition at discharge: Stable  Note: Portions of this report may have been transcribed using voice recognition software. Every effort was made to ensure accuracy; however, inadvertent computerized transcription errors may be present.    Final Clinical Impression(s) / ED Diagnoses Final diagnoses:  Musculoskeletal pain   Rx / DC Orders ED Discharge Orders    None       Rayna Sexton, PA-C 06/13/20 1230    Davonna Belling, MD 06/13/20 1539

## 2020-06-13 NOTE — Discharge Instructions (Addendum)
You can continue to take Tylenol for management of your acute pain.  Please follow the instructions in the bottle.  Also, please consider topical pain relieving creams such as Voltaran Gel, BioFreeze, or Icy Hot. There is also a pain relieving cream made by [employer]. You should be able to find all of these at your local pharmacy.   Please follow-up with your primary care provider if your symptoms have not improved in 1 week.  If they worsen, please return to the emergency department.  It was a pleasure to meet you.

## 2020-07-07 ENCOUNTER — Other Ambulatory Visit: Payer: Self-pay | Admitting: Cardiology

## 2020-07-07 DIAGNOSIS — I480 Paroxysmal atrial fibrillation: Secondary | ICD-10-CM

## 2020-07-07 NOTE — Telephone Encounter (Signed)
Age 86, weight 95kg, SCr 1.41 on 06/13/20, was previously 1.55 last year so Eliquis dose was reduced. Renal function slightly improved now so pt technically qualifies for 5mg  BID again. Will continue pt on reduced dose for now but will recheck BMET and CBC in another 3 months to see how SCr trends. If it remains < 1.5 at that time, will reduce dose of Eliquis. Spoke with pt's wife who is aware of plan, she will have pt return call if he has any questions.

## 2020-07-08 DIAGNOSIS — L988 Other specified disorders of the skin and subcutaneous tissue: Secondary | ICD-10-CM | POA: Diagnosis not present

## 2020-07-08 DIAGNOSIS — L570 Actinic keratosis: Secondary | ICD-10-CM | POA: Diagnosis not present

## 2020-07-08 DIAGNOSIS — C44629 Squamous cell carcinoma of skin of left upper limb, including shoulder: Secondary | ICD-10-CM | POA: Diagnosis not present

## 2020-07-10 ENCOUNTER — Other Ambulatory Visit: Payer: Self-pay | Admitting: Family Medicine

## 2020-07-14 NOTE — Telephone Encounter (Signed)
Last seen 10/01/19

## 2020-07-29 ENCOUNTER — Other Ambulatory Visit: Payer: Self-pay | Admitting: Family Medicine

## 2020-08-04 ENCOUNTER — Other Ambulatory Visit: Payer: Self-pay | Admitting: Cardiology

## 2020-08-08 DIAGNOSIS — L723 Sebaceous cyst: Secondary | ICD-10-CM | POA: Diagnosis not present

## 2020-08-08 DIAGNOSIS — D485 Neoplasm of uncertain behavior of skin: Secondary | ICD-10-CM | POA: Diagnosis not present

## 2020-08-08 DIAGNOSIS — K111 Hypertrophy of salivary gland: Secondary | ICD-10-CM | POA: Diagnosis not present

## 2020-08-08 DIAGNOSIS — D225 Melanocytic nevi of trunk: Secondary | ICD-10-CM | POA: Diagnosis not present

## 2020-08-08 DIAGNOSIS — L821 Other seborrheic keratosis: Secondary | ICD-10-CM | POA: Diagnosis not present

## 2020-08-08 DIAGNOSIS — Z85828 Personal history of other malignant neoplasm of skin: Secondary | ICD-10-CM | POA: Diagnosis not present

## 2020-08-08 DIAGNOSIS — L570 Actinic keratosis: Secondary | ICD-10-CM | POA: Diagnosis not present

## 2020-08-08 DIAGNOSIS — L578 Other skin changes due to chronic exposure to nonionizing radiation: Secondary | ICD-10-CM | POA: Diagnosis not present

## 2020-08-08 DIAGNOSIS — D0461 Carcinoma in situ of skin of right upper limb, including shoulder: Secondary | ICD-10-CM | POA: Diagnosis not present

## 2020-08-19 NOTE — Progress Notes (Signed)
Electrophysiology Office Note Date: 08/20/2020  ID:  David Bush, David Bush 1934/09/18, MRN [id number]  PCP: [name], MD Electrophysiologist: Curt Bears  CC: AF follow up  David Bush is a 86 y.o. male seen today for Dr Curt Bears.  He presents today for routine electrophysiology followup.  Since last being seen in our clinic, the patient reports doing very well.  He remains very active playing golf and working out weekly. He denies chest pain, palpitations, dyspnea, PND, orthopnea, nausea, vomiting, dizziness, syncope, edema, weight gain, or early satiety. He has had no awareness of AF.   Past Medical History:  Diagnosis Date   Atrial fibrillation ([place])    Barrett's esophagus    Cataract    surgery to remove bilateral   Chest pain    in feb 2020/ saw cardiologist- no heart attack!   CKD (chronic kidney disease) stage 3, GFR 30-59 ml/min    GERD (gastroesophageal reflux disease)    Hx of hiatal hernia    Hyperlipidemia    Hypertension    Skin cancer    squamous cell of face/arm/legs   Substance abuse ([place])    stopped drinking in 1993   Past Surgical History:  Procedure Laterality Date   CATARACT EXTRACTION, BILATERAL  2012   COLONOSCOPY     KNEE ARTHROSCOPY Left 2010   torn meniscus   SKIN CANCER EXCISION  2015, 2012   face, arms and legs   UPPER GASTROINTESTINAL ENDOSCOPY     barrett's esophagus   WISDOM TOOTH EXTRACTION  1980   WISDOM TOOTH EXTRACTION      Current Outpatient Medications  Medication Sig Dispense Refill   acetaminophen (TYLENOL) 500 MG tablet Take 1,000 mg by mouth every 6 (six) hours as needed for moderate pain.     amLODipine (NORVASC) 10 MG tablet Take 1 tablet (10 mg total) by mouth daily. (Patient taking differently: Take 10 mg by mouth every evening. ) 180 tablet 2   apixaban (ELIQUIS) 2.5 MG TABS tablet Take 1 tablet (2.5 mg total) by mouth 2 (two) times daily. 180 tablet 0   benazepril (LOTENSIN) 40 MG  tablet TAKE 1 TABLET DAILY (Patient taking differently: Take 40 mg by mouth every evening. ) 90 tablet 3   carvedilol (COREG) 6.25 MG tablet TAKE 1 TABLET TWICE A DAY 180 tablet 1   finasteride (PROSCAR) 5 MG tablet TAKE 1 TABLET DAILY (NEED OFFICE VISIT) 30 tablet 11   flecainide (TAMBOCOR) 100 MG tablet Take 1 tablet (100 mg total) by mouth 2 (two) times daily. 180 tablet 3   Multiple Vitamins-Minerals (CENTRUM SILVER PO) Take 1 tablet by mouth daily.     nitroGLYCERIN (NITROSTAT) 0.4 MG SL tablet Place 1 tablet (0.4 mg total) under the tongue every 5 (five) minutes as needed for chest pain. 25 tablet 3   pantoprazole (PROTONIX) 40 MG tablet TAKE 1 TABLET DAILY (Patient taking differently: Take 40 mg by mouth every evening. ) 90 tablet 3   rosuvastatin (CRESTOR) 20 MG tablet TAKE 1 TABLET DAILY (Patient taking differently: Take 20 mg by mouth every evening. ) 90 tablet 3   No current facility-administered medications for this visit.    Allergies:   Diltiazem   Social History: Social History   Socioeconomic History   Marital status: Married    Spouse name: Not on file   Number of children: Not on file   Years of education: Not on file   Highest education level: Not on file  Occupational History   Not on file  Tobacco Use   Smoking status: Former Smoker    Years: 16.00    Types: Cigarettes    Quit date: 12/06/1966    Years since quitting: 53.7   Smokeless tobacco: Never Used  Vaping Use   Vaping Use: Never used  Substance and Sexual Activity   Alcohol use: No    Alcohol/week: 0.0 standard drinks    Comment: 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   Drug use: No   Sexual activity: Not on file  Other Topics Concern   Not on file  Social History Narrative   Not on file   Social Determinants of Health   Financial Resource Strain:    Difficulty of Paying Living Expenses: Not on file  Food Insecurity:    Worried About [place] in the  Last Year: Not on file   [employer] of Food in the Last Year: Not on file  Transportation Needs:    Lack of Transportation (Medical): Not on file   Lack of Transportation (Non-Medical): Not on file  Physical Activity:    Days of Exercise per Week: Not on file   Minutes of Exercise per Session: Not on file  Stress:    Feeling of Stress : Not on file  Social Connections:    Frequency of Communication with Friends and Family: Not on file   Frequency of Social Gatherings with Friends and Family: Not on file   Attends Religious Services: Not on file   Active Member of Clubs or Organizations: Not on file   Attends Archivist Meetings: Not on file   Marital Status: Not on file  Intimate Partner Violence:    Fear of Current or Ex-Partner: Not on file   Emotionally Abused: Not on file   Physically Abused: Not on file   Sexually Abused: Not on file    Family History: Family History  Problem Relation Age of Onset   Esophageal cancer Mother    Stroke Father    Kidney cancer Sister    Kidney disease Brother    Other Brother    Heart disease Brother    Colon cancer Neg Hx    Colon polyps Neg Hx    Rectal cancer Neg Hx    Stomach cancer Neg Hx     Review of Systems: All other systems reviewed and are otherwise negative except as noted above.   Physical Exam: VS:  BP (!) 116/54    Pulse (!) 58    Ht 6\' 1"  (1.854 m)    Wt 203 lb 6.4 oz (92.3 kg)    SpO2 97%    BMI 26.84 kg/m  , BMI Body mass index is 26.84 kg/m. Wt Readings from Last 3 Encounters:  08/20/20 203 lb 6.4 oz (92.3 kg)  01/29/20 210 lb 12.8 oz (95.6 kg)  10/01/19 204 lb (92.5 kg)    GEN- The patient is well appearing, alert and oriented x 3 today.   HEENT: normocephalic, atraumatic; sclera clear, conjunctiva pink; hearing intact; oropharynx clear; neck supple  Lungs- Clear to ausculation bilaterally, normal work of breathing.  No wheezes, rales, rhonchi Heart- Regular rate and  rhythm, no murmurs, rubs or gallops  GI- soft, non-tender, non-distended, bowel sounds present  Extremities- no clubbing, cyanosis, or edema; DP/PT/radial pulses 2+ bilaterally MS- no significant deformity or atrophy Skin- warm and dry, no rash or lesion  Psych- euthymic mood, full affect Neuro- strength and sensation  are intact   EKG:  EKG is ordered today. The ekg ordered today shows sinus bradycardia, rate 48, normal intervals   Recent Labs: 09/28/2019: ALT 18 06/13/2020: BUN 22; Creatinine, Ser 1.41; Hemoglobin 14.1; Platelets 199; Potassium 4.2; Sodium 137    Other studies Reviewed: Additional studies/ records that were reviewed today include: Dr Curt Bears office notes  Assessment and Plan:  1.  Paroxysmal atrial fibrillation Doing well on Flecainide without significant AF recurrence EKG stable today Continue Eliquis for CHADS2VASC of 3  2.  HTN Stable No change required today   Current medicines are reviewed at length with the patient today.   The patient does not have concerns regarding his medicines.  The following changes were made today:  none  Labs/ tests ordered today include: none Orders Placed This Encounter  Procedures   EKG 12-Lead     Disposition:   Follow up with 6 months with Dr Curt Bears    Signed, Chanetta Marshall, NP 08/20/2020 11:20 AM   [facility] [address] [place] [address] [phone] (office) [phone] (fax)

## 2020-08-20 ENCOUNTER — Ambulatory Visit (INDEPENDENT_AMBULATORY_CARE_PROVIDER_SITE_OTHER): Payer: Medicare Other | Admitting: Nurse Practitioner

## 2020-08-20 ENCOUNTER — Encounter: Payer: Self-pay | Admitting: Nurse Practitioner

## 2020-08-20 ENCOUNTER — Other Ambulatory Visit: Payer: Self-pay

## 2020-08-20 VITALS — BP 116/54 | HR 58 | Ht 73.0 in | Wt 203.4 lb

## 2020-08-20 DIAGNOSIS — I480 Paroxysmal atrial fibrillation: Secondary | ICD-10-CM

## 2020-08-20 DIAGNOSIS — I10 Essential (primary) hypertension: Secondary | ICD-10-CM

## 2020-08-20 NOTE — Patient Instructions (Signed)
Medication Instructions:  No changes *If you need a refill on your cardiac medications before your next appointment, please call your pharmacy*   Lab Work: none If you have labs (blood work) drawn today and your tests are completely normal, you will receive your results only by: Marland Kitchen MyChart Message (if you have MyChart) OR . A paper copy in the mail If you have any lab test that is abnormal or we need to change your treatment, we will call you to review the results.   Testing/Procedures: none   Follow-Up: At [facility], you and your health needs are our priority.  As part of our continuing mission to provide you with exceptional heart care, we have created designated Provider Care Teams.  These Care Teams include your primary Cardiologist (physician) and Advanced Practice Providers (APPs -  Physician Assistants and Nurse Practitioners) who all work together to provide you with the care you need, when you need it.  Your next appointment:   6 month(s)  The format for your next appointment:   In Person  Provider:   Allegra Lai, MD   Other Instructions

## 2020-08-22 ENCOUNTER — Other Ambulatory Visit: Payer: Self-pay

## 2020-08-22 MED ORDER — FINASTERIDE 5 MG PO TABS: ORAL_TABLET | ORAL | 3 refills | Status: DC

## 2020-08-27 ENCOUNTER — Other Ambulatory Visit: Payer: Self-pay | Admitting: Family Medicine

## 2020-08-28 ENCOUNTER — Other Ambulatory Visit: Payer: Self-pay

## 2020-09-01 ENCOUNTER — Ambulatory Visit (INDEPENDENT_AMBULATORY_CARE_PROVIDER_SITE_OTHER): Payer: Medicare Other | Admitting: *Deleted

## 2020-09-01 ENCOUNTER — Other Ambulatory Visit: Payer: Self-pay

## 2020-09-01 DIAGNOSIS — Z23 Encounter for immunization: Secondary | ICD-10-CM | POA: Diagnosis not present

## 2020-09-19 DIAGNOSIS — D485 Neoplasm of uncertain behavior of skin: Secondary | ICD-10-CM | POA: Diagnosis not present

## 2020-09-19 DIAGNOSIS — C44622 Squamous cell carcinoma of skin of right upper limb, including shoulder: Secondary | ICD-10-CM | POA: Diagnosis not present

## 2020-09-19 DIAGNOSIS — D0461 Carcinoma in situ of skin of right upper limb, including shoulder: Secondary | ICD-10-CM | POA: Diagnosis not present

## 2020-09-30 DIAGNOSIS — C44622 Squamous cell carcinoma of skin of right upper limb, including shoulder: Secondary | ICD-10-CM | POA: Diagnosis not present

## 2020-10-01 ENCOUNTER — Other Ambulatory Visit: Payer: Medicare Other

## 2020-10-01 ENCOUNTER — Other Ambulatory Visit: Payer: Self-pay

## 2020-10-01 DIAGNOSIS — I10 Essential (primary) hypertension: Secondary | ICD-10-CM

## 2020-10-01 DIAGNOSIS — E7800 Pure hypercholesterolemia, unspecified: Secondary | ICD-10-CM | POA: Diagnosis not present

## 2020-10-01 DIAGNOSIS — R710 Precipitous drop in hematocrit: Secondary | ICD-10-CM | POA: Diagnosis not present

## 2020-10-01 DIAGNOSIS — Z125 Encounter for screening for malignant neoplasm of prostate: Secondary | ICD-10-CM

## 2020-10-02 LAB — CBC WITH DIFFERENTIAL/PLATELET
Absolute Monocytes: 540 cells/uL (ref 200–950)
Basophils Absolute: 52 cells/uL (ref 0–200)
Basophils Relative: 0.7 %
Eosinophils Absolute: 178 cells/uL (ref 15–500)
Eosinophils Relative: 2.4 %
HCT: 39.6 % (ref 38.5–50.0)
Hemoglobin: 13.2 g/dL (ref 13.2–17.1)
Lymphs Abs: 1029 cells/uL (ref 850–3900)
MCH: 30.9 pg (ref 27.0–33.0)
MCHC: 33.3 g/dL (ref 32.0–36.0)
MCV: 92.7 fL (ref 80.0–100.0)
MPV: 10.1 fL (ref 7.5–12.5)
Monocytes Relative: 7.3 %
Neutro Abs: 5602 cells/uL (ref 1500–7800)
Neutrophils Relative %: 75.7 %
Platelets: 189 10*3/uL (ref 140–400)
RBC: 4.27 10*6/uL (ref 4.20–5.80)
RDW: 12.2 % (ref 11.0–15.0)
Total Lymphocyte: 13.9 %
WBC: 7.4 10*3/uL (ref 3.8–10.8)

## 2020-10-02 LAB — COMPLETE METABOLIC PANEL WITH GFR
AG Ratio: 1.8 (calc) (ref 1.0–2.5)
ALT: 13 U/L (ref 9–46)
AST: 14 U/L (ref 10–35)
Albumin: 4 g/dL (ref 3.6–5.1)
Alkaline phosphatase (APISO): 56 U/L (ref 35–144)
BUN/Creatinine Ratio: 14 (calc) (ref 6–22)
BUN: 21 mg/dL (ref 7–25)
CO2: 28 mmol/L (ref 20–32)
Calcium: 9.3 mg/dL (ref 8.6–10.3)
Chloride: 103 mmol/L (ref 98–110)
Creat: 1.54 mg/dL — ABNORMAL HIGH (ref 0.70–1.11)
GFR, Est African American: 47 mL/min/{1.73_m2} — ABNORMAL LOW (ref >=60)
GFR, Est Non African American: 41 mL/min/{1.73_m2} — ABNORMAL LOW (ref >=60)
Globulin: 2.2 g/dL (calc) (ref 1.9–3.7)
Glucose, Bld: 98 mg/dL (ref 65–99)
Potassium: 4.5 mmol/L (ref 3.5–5.3)
Sodium: 137 mmol/L (ref 135–146)
Total Bilirubin: 0.5 mg/dL (ref 0.2–1.2)
Total Protein: 6.2 g/dL (ref 6.1–8.1)

## 2020-10-02 LAB — IRON,TIBC AND FERRITIN PANEL
%SAT: 21 % (calc) (ref 20–48)
Ferritin: 54 ng/mL (ref 24–380)
Iron: 69 ug/dL (ref 50–180)
TIBC: 329 mcg/dL (calc) (ref 250–425)

## 2020-10-02 LAB — LIPID PANEL
Cholesterol: 103 mg/dL (ref <200)
HDL: 38 mg/dL — ABNORMAL LOW (ref >=40)
LDL Cholesterol (Calc): 45 mg/dL (calc)
Non-HDL Cholesterol (Calc): 65 mg/dL (calc) (ref <130)
Total CHOL/HDL Ratio: 2.7 (calc) (ref <5.0)
Triglycerides: 114 mg/dL (ref <150)

## 2020-10-03 ENCOUNTER — Ambulatory Visit (INDEPENDENT_AMBULATORY_CARE_PROVIDER_SITE_OTHER): Payer: Medicare Other | Admitting: Family Medicine

## 2020-10-03 ENCOUNTER — Other Ambulatory Visit: Payer: Self-pay

## 2020-10-03 VITALS — BP 120/60 | HR 66 | Temp 97.5°F | Ht 73.0 in | Wt 201.0 lb

## 2020-10-03 DIAGNOSIS — E7800 Pure hypercholesterolemia, unspecified: Secondary | ICD-10-CM

## 2020-10-03 DIAGNOSIS — Z0001 Encounter for general adult medical examination with abnormal findings: Secondary | ICD-10-CM | POA: Diagnosis not present

## 2020-10-03 DIAGNOSIS — Z0000 Encounter for general adult medical examination without abnormal findings: Secondary | ICD-10-CM

## 2020-10-03 DIAGNOSIS — N183 Chronic kidney disease, stage 3 unspecified: Secondary | ICD-10-CM

## 2020-10-03 DIAGNOSIS — I480 Paroxysmal atrial fibrillation: Secondary | ICD-10-CM

## 2020-10-03 DIAGNOSIS — I10 Essential (primary) hypertension: Secondary | ICD-10-CM | POA: Diagnosis not present

## 2020-10-03 DIAGNOSIS — K2270 Barrett's esophagus without dysplasia: Secondary | ICD-10-CM

## 2020-10-03 NOTE — Progress Notes (Signed)
Subjective:    Patient ID: David Bush, male    DOB: 03/21/1934, 86 y.o.   MRN: [id number]  HPI Patient is here today for complete physical exam.  Patient is already had his flu shot in September.  He declines a tetanus shot today.  He also declines a shingles vaccine.  He has a history of Barrett's esophagus. He is followed by GI, Dr. Wilfrid Lund.  He is on Protonix for Barrett's esophagus.   He no longer requires a colonoscopy.  His most recent lab work is listed below and is significant for an HDL cholesterol of 38.  He has stage III chronic kidney disease but is stable.  He denies any falls, depression, or memory loss.  He recently had a skin cancer removed from the dorsum of his right hand.  He also has what appears to be an early squamous cell carcinoma developing on the right side of his nasal bridge adjacent to his right eye.  I pointed this out to him today and he has an appointment to see his dermatologist in 1 month.  I recommend he discuss this with his dermatologist at that point as he sees them frequently. Immunization History  Administered Date(s) Administered  . Fluad Quad(high Dose 65+) 09/28/2019, 09/01/2020  . Influenza, High Dose Seasonal PF 08/25/2017  . Influenza,inj,Quad PF,6+ Mos 08/31/2016, 08/30/2018  . Influenza-Unspecified 09/05/2013  . PFIZER SARS-COV-2 Vaccination 12/31/2019, 01/21/2020  . Pneumococcal Conjugate-13 10/01/2016  . Pneumococcal Polysaccharide-23 12/06/2013   Covid vaccines are up-to-date and he recently had his booster Lab on 10/01/2020  Component Date Value Ref Range Status  . Glucose, Bld 10/01/2020 98  65 - 99 mg/dL Final   Comment: .            Fasting reference interval .   . BUN 10/01/2020 21  7 - 25 mg/dL Final  . Creat 10/01/2020 1.54* 0.70 - 1.11 mg/dL Final   Comment: For patients >68 years of age, the reference limit for Creatinine is approximately 13% higher for people identified as African-American. .   . GFR, Est Non  African American 10/01/2020 41* > OR = 60 mL/min/1.61m2 Final  . GFR, Est African American 10/01/2020 47* > OR = 60 mL/min/1.63m2 Final  . BUN/Creatinine Ratio 10/01/2020 14  6 - 22 (calc) Final  . Sodium 10/01/2020 137  135 - 146 mmol/L Final  . Potassium 10/01/2020 4.5  3.5 - 5.3 mmol/L Final  . Chloride 10/01/2020 103  98 - 110 mmol/L Final  . CO2 10/01/2020 28  20 - 32 mmol/L Final  . Calcium 10/01/2020 9.3  8.6 - 10.3 mg/dL Final  . Total Protein 10/01/2020 6.2  6.1 - 8.1 g/dL Final  . Albumin 10/01/2020 4.0  3.6 - 5.1 g/dL Final  . Globulin 10/01/2020 2.2  1.9 - 3.7 g/dL (calc) Final  . AG Ratio 10/01/2020 1.8  1.0 - 2.5 (calc) Final  . Total Bilirubin 10/01/2020 0.5  0.2 - 1.2 mg/dL Final  . Alkaline phosphatase (APISO) 10/01/2020 56  35 - 144 U/L Final  . AST 10/01/2020 14  10 - 35 U/L Final  . ALT 10/01/2020 13  9 - 46 U/L Final  . Cholesterol 10/01/2020 103  <200 mg/dL Final  . HDL 10/01/2020 38* > OR = 40 mg/dL Final  . Triglycerides 10/01/2020 114  <150 mg/dL Final  . LDL Cholesterol (Calc) 10/01/2020 45  mg/dL (calc) Final   Comment: Reference range: <100 . Desirable range <100 mg/dL for primary prevention;   <  70 mg/dL for patients with CHD or diabetic patients  with > or = 2 CHD risk factors. Marland Kitchen LDL-C is now calculated using the Martin-Hopkins  calculation, which is a validated novel method providing  better accuracy than the Friedewald equation in the  estimation of LDL-C.  Cresenciano Genre et al. Annamaria Helling. 1610;960(45): 2061-2068  (http://education.[facility].com/faq/FAQ164)   . Total CHOL/HDL Ratio 10/01/2020 2.7  <5.0 (calc) Final  . Non-HDL Cholesterol (Calc) 10/01/2020 65  <130 mg/dL (calc) Final   Comment: For patients with diabetes plus 1 major ASCVD risk  factor, treating to a non-HDL-C goal of <100 mg/dL  (LDL-C of <70 mg/dL) is considered a therapeutic  option.   . WBC 10/01/2020 7.4  3.8 - 10.8 Thousand/uL Final  . RBC 10/01/2020 4.27  4.20 - 5.80  Million/uL Final  . Hemoglobin 10/01/2020 13.2  13.2 - 17.1 g/dL Final  . HCT 10/01/2020 39.6  38 - 50 % Final  . MCV 10/01/2020 92.7  80.0 - 100.0 fL Final  . MCH 10/01/2020 30.9  27.0 - 33.0 pg Final  . MCHC 10/01/2020 33.3  32.0 - 36.0 g/dL Final  . RDW 10/01/2020 12.2  11.0 - 15.0 % Final  . Platelets 10/01/2020 189  140 - 400 Thousand/uL Final  . MPV 10/01/2020 10.1  7.5 - 12.5 fL Final  . Neutro Abs 10/01/2020 5,602  1,500 - 7,800 cells/uL Final  . Lymphs Abs 10/01/2020 1,029  850 - 3,900 cells/uL Final  . Absolute Monocytes 10/01/2020 540  200 - 950 cells/uL Final  . Eosinophils Absolute 10/01/2020 178  15.0 - 500.0 cells/uL Final  . Basophils Absolute 10/01/2020 52  0.0 - 200.0 cells/uL Final  . Neutrophils Relative % 10/01/2020 75.7  % Final  . Total Lymphocyte 10/01/2020 13.9  % Final  . Monocytes Relative 10/01/2020 7.3  % Final  . Eosinophils Relative 10/01/2020 2.4  % Final  . Basophils Relative 10/01/2020 0.7  % Final  . Iron 10/01/2020 69  50 - 180 mcg/dL Final  . TIBC 10/01/2020 329  250 - 425 mcg/dL (calc) Final  . %SAT 10/01/2020 21  20 - 48 % (calc) Final  . Ferritin 10/01/2020 54  24 - 380 ng/mL Final    Past Medical History:  Diagnosis Date  . Atrial fibrillation ([place])   . Barrett's esophagus   . Cataract    surgery to remove bilateral  . Chest pain    in feb 2020/ saw cardiologist- no heart attack!  . CKD (chronic kidney disease) stage 3, GFR 30-59 ml/min   . GERD (gastroesophageal reflux disease)   . Hx of hiatal hernia   . Hyperlipidemia   . Hypertension   . Skin cancer    squamous cell of face/arm/legs  . Substance abuse ([place])    stopped drinking in 1993   Past Surgical History:  Procedure Laterality Date  . CATARACT EXTRACTION, BILATERAL  2012  . COLONOSCOPY    . KNEE ARTHROSCOPY Left 2010   torn meniscus  . SKIN CANCER EXCISION  2015, 2012   face, arms and legs  . UPPER GASTROINTESTINAL ENDOSCOPY     barrett's esophagus  . [place]  EXTRACTION  1980  . WISDOM TOOTH EXTRACTION     Current Outpatient Medications on File Prior to Visit  Medication Sig Dispense Refill  . acetaminophen (TYLENOL) 500 MG tablet Take 1,000 mg by mouth every 6 (six) hours as needed for moderate pain.    Marland Kitchen amLODipine (NORVASC) 10 MG tablet Take 1 tablet (  10 mg total) by mouth daily. (Patient taking differently: Take 10 mg by mouth every evening. ) 180 tablet 2  . apixaban (ELIQUIS) 2.5 MG TABS tablet Take 1 tablet (2.5 mg total) by mouth 2 (two) times daily. 180 tablet 0  . benazepril (LOTENSIN) 40 MG tablet TAKE 1 TABLET DAILY (Patient taking differently: Take 40 mg by mouth every evening. ) 90 tablet 3  . carvedilol (COREG) 6.25 MG tablet TAKE 1 TABLET TWICE A DAY 180 tablet 1  . finasteride (PROSCAR) 5 MG tablet TAKE 1 TABLET DAILY (NEED OFFICE VISIT) 90 tablet 3  . flecainide (TAMBOCOR) 100 MG tablet Take 1 tablet (100 mg total) by mouth 2 (two) times daily. 180 tablet 3  . Multiple Vitamins-Minerals (CENTRUM SILVER PO) Take 1 tablet by mouth daily.    . nitroGLYCERIN (NITROSTAT) 0.4 MG SL tablet Place 1 tablet (0.4 mg total) under the tongue every 5 (five) minutes as needed for chest pain. 25 tablet 3  . pantoprazole (PROTONIX) 40 MG tablet TAKE 1 TABLET DAILY (Patient taking differently: Take 40 mg by mouth every evening. ) 90 tablet 3  . rosuvastatin (CRESTOR) 20 MG tablet TAKE 1 TABLET DAILY 90 tablet 3   No current facility-administered medications on file prior to visit.   Allergies  Allergen Reactions  . Diltiazem Itching and Rash   Social History   Socioeconomic History  . Marital status: Married    Spouse name: Not on file  . Number of children: Not on file  . Years of education: Not on file  . Highest education level: Not on file  Occupational History  . Not on file  Tobacco Use  . Smoking status: Former Smoker    Years: 16.00    Types: Cigarettes    Quit date: 12/06/1966    Years since quitting: 53.8  . Smokeless  tobacco: Never Used  Vaping Use  . Vaping Use: Never used  Substance and Sexual Activity  . Alcohol use: No    Alcohol/week: 0.0 standard drinks    Comment: 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  . Drug use: No  . Sexual activity: Not on file  Other Topics Concern  . Not on file  Social History Narrative  . Not on file   Social Determinants of Health   Financial Resource Strain:   . Difficulty of Paying Living Expenses: Not on file  Food Insecurity:   . Worried About Charity fundraiser in the Last Year: Not on file  . Ran Out of Food in the Last Year: Not on file  Transportation Needs:   . Lack of Transportation (Medical): Not on file  . Lack of Transportation (Non-Medical): Not on file  Physical Activity:   . Days of Exercise per Week: Not on file  . Minutes of Exercise per Session: Not on file  Stress:   . Feeling of Stress : Not on file  Social Connections:   . Frequency of Communication with Friends and Family: Not on file  . Frequency of Social Gatherings with Friends and Family: Not on file  . Attends Religious Services: Not on file  . Active Member of Clubs or Organizations: Not on file  . Attends Archivist Meetings: Not on file  . Marital Status: Not on file  Intimate Partner Violence:   . Fear of Current or Ex-Partner: Not on file  . Emotionally Abused: Not on file  . Physically Abused: Not on file  . Sexually Abused: Not  on file   Family History  Problem Relation Age of Onset  . Esophageal cancer Mother   . Stroke Father   . Kidney cancer Sister   . Kidney disease Brother   . Other Brother   . Heart disease Brother   . Colon cancer Neg Hx   . Colon polyps Neg Hx   . Rectal cancer Neg Hx   . Stomach cancer Neg Hx       Review of Systems  All other systems reviewed and are negative.      Objective:   Physical Exam Vitals reviewed.  Constitutional:      General: He is not in acute distress.    Appearance: He is  well-developed. He is not diaphoretic.  HENT:     Head: Normocephalic and atraumatic.     Right Ear: External ear normal.     Left Ear: External ear normal.     Nose: Nose normal.     Mouth/Throat:     Pharynx: No oropharyngeal exudate.  Eyes:     General: No scleral icterus.       Right eye: No discharge.        Left eye: No discharge.     Conjunctiva/sclera: Conjunctivae normal.     Pupils: Pupils are equal, round, and reactive to light.  Neck:     Thyroid: No thyromegaly.     Vascular: No JVD.     Trachea: No tracheal deviation.  Cardiovascular:     Rate and Rhythm: Normal rate and regular rhythm.     Heart sounds: Murmur heard.  No friction rub. No gallop.   Pulmonary:     Effort: Pulmonary effort is normal. No respiratory distress.     Breath sounds: Normal breath sounds. No stridor. No wheezing or rales.  Chest:     Chest wall: No tenderness.  Abdominal:     General: Bowel sounds are normal. There is no distension.     Palpations: Abdomen is soft. There is no mass.     Tenderness: There is no abdominal tenderness. There is no guarding or rebound.  Genitourinary:    Penis: Normal.      Prostate: Normal.     Rectum: Normal.  Musculoskeletal:        General: No deformity.     Cervical back: Normal range of motion and neck supple.  Lymphadenopathy:     Cervical: No cervical adenopathy.  Skin:    General: Skin is warm.     Coloration: Skin is not pale.     Findings: No erythema or rash.  Neurological:     Mental Status: He is alert and oriented to person, place, and time.     Cranial Nerves: No cranial nerve deficit.     Motor: No abnormal muscle tone.     Coordination: Coordination normal.     Deep Tendon Reflexes: Reflexes are normal and symmetric.  Psychiatric:        Behavior: Behavior normal.        Thought Content: Thought content normal.        Judgment: Judgment normal.           Assessment & Plan:  General medical exam  Benign essential  HTN  Pure hypercholesterolemia  CRI (chronic renal insufficiency), stage 3 (moderate) (HCC)  Paroxysmal atrial fibrillation (HCC)  Barrett's esophagus without dysplasia  Patient's murmur slightly louder on exam today than previous.  He had an echocardiogram in 2019 that showed mild aortic stenosis.  He is asymptomatic so we will simply monitor this on exam.  Is currently grade 3 out of 6.  He denies any chest pain shortness of breath or dyspnea on exertion or syncope or lightheadedness.  Lab work is outstanding aside from his mild chronic kidney disease.  Cancer screening is no longer recommended especially colonoscopy or prostate cancer screening.  Blood pressure today is well controlled.  LDL cholesterol is well below his goal of 70.  Today the patient is in normal sinus rhythm and his heart rate is well controlled.  He denies any falls, depression, or memory loss.  Regular anticipatory guidance is provided.

## 2020-10-06 ENCOUNTER — Other Ambulatory Visit: Payer: Medicare Other

## 2020-10-06 ENCOUNTER — Other Ambulatory Visit: Payer: Self-pay

## 2020-10-06 ENCOUNTER — Telehealth: Payer: Self-pay | Admitting: Cardiology

## 2020-10-06 ENCOUNTER — Other Ambulatory Visit: Payer: Self-pay | Admitting: Cardiology

## 2020-10-06 DIAGNOSIS — I480 Paroxysmal atrial fibrillation: Secondary | ICD-10-CM

## 2020-10-06 NOTE — Telephone Encounter (Signed)
Prescription refill request for Eliquis received. Indication: a fib Last office visit: 08/20/20 Scr: 1.54 Age: 86 Weight: 91kg

## 2020-10-06 NOTE — Telephone Encounter (Signed)
Patient came in today for CBC and BMET.  He already had lab work per Dr. Jenna Luo on 10/01/2020. Per triage, I told him that since there was not a note to have the labs re-drawn, he should not have them repeated today.   I let him know that if Dr. Curt Bears wants them repeated, that you would call him.

## 2020-11-10 DIAGNOSIS — D0461 Carcinoma in situ of skin of right upper limb, including shoulder: Secondary | ICD-10-CM | POA: Diagnosis not present

## 2020-11-10 DIAGNOSIS — D485 Neoplasm of uncertain behavior of skin: Secondary | ICD-10-CM | POA: Diagnosis not present

## 2020-11-10 DIAGNOSIS — C44329 Squamous cell carcinoma of skin of other parts of face: Secondary | ICD-10-CM | POA: Diagnosis not present

## 2020-11-10 DIAGNOSIS — L570 Actinic keratosis: Secondary | ICD-10-CM | POA: Diagnosis not present

## 2020-11-25 DIAGNOSIS — C44329 Squamous cell carcinoma of skin of other parts of face: Secondary | ICD-10-CM | POA: Diagnosis not present

## 2020-11-25 DIAGNOSIS — C411 Malignant neoplasm of mandible: Secondary | ICD-10-CM | POA: Diagnosis not present

## 2020-12-08 ENCOUNTER — Other Ambulatory Visit: Payer: Self-pay | Admitting: Family Medicine

## 2021-01-20 DIAGNOSIS — L821 Other seborrheic keratosis: Secondary | ICD-10-CM | POA: Diagnosis not present

## 2021-01-20 DIAGNOSIS — L570 Actinic keratosis: Secondary | ICD-10-CM | POA: Diagnosis not present

## 2021-01-20 DIAGNOSIS — C44712 Basal cell carcinoma of skin of right lower limb, including hip: Secondary | ICD-10-CM | POA: Diagnosis not present

## 2021-01-20 DIAGNOSIS — L918 Other hypertrophic disorders of the skin: Secondary | ICD-10-CM | POA: Diagnosis not present

## 2021-01-26 ENCOUNTER — Telehealth: Payer: Self-pay | Admitting: Cardiology

## 2021-01-26 NOTE — Telephone Encounter (Signed)
Inquired as to provider that had been filling the medication for pt as our system shows last Rx from our office was sent 09/2019. Pt reports that the bottle he has on hand at home is dated 07/2020, 90 day supply.  States is was filled by Dr. Curt Bears. Informed pt that he since he has not been taking the medication daily/as directed since his last bottle is from August of last year, then he should call PCP to discuss need of medication.  (aware we increased it d/t BP numbers back in 2020, but that Dr. Curt Bears typically does NOT follow BPs) Pt reports BP at home is WNL. Pt states he is not going to take it, seems as though he doesn't need it. Pt advised to take BP several times a day, different times (ex: morning, afternoon/evening).  Keep a log and follow up with PCP about  BP log and if medication needs to be restarted. Pt agreeable to plan. Will forward note to PCP for his FYI

## 2021-01-26 NOTE — Telephone Encounter (Signed)
Pt c/o medication issue:  1. Name of Medication:  amLODipine (NORVASC) 10 MG tablet [[id number]]    2. How are you currently taking this medication (dosage and times per day)? Take 10 mg by mouth every evening  3. Are you having a reaction (difficulty breathing--STAT)? NA   4. What is your medication issue? Pt called in and stated he seen this med was cancelled on his mail order.  He wants to know if he should still be taking this med?  And if so how much?  He said he read that this med is bad for the kidneys and he has a kidney diease    Best number  [phone]

## 2021-02-02 ENCOUNTER — Other Ambulatory Visit: Payer: Self-pay | Admitting: Cardiology

## 2021-02-02 ENCOUNTER — Other Ambulatory Visit: Payer: Self-pay | Admitting: Family Medicine

## 2021-02-04 ENCOUNTER — Other Ambulatory Visit: Payer: Self-pay

## 2021-02-05 MED ORDER — AMLODIPINE BESYLATE 10 MG PO TABS: 10.0000 mg | ORAL_TABLET | Freq: Every day | ORAL | 2 refills | Status: DC

## 2021-02-24 ENCOUNTER — Encounter: Payer: Self-pay | Admitting: Cardiology

## 2021-02-24 ENCOUNTER — Other Ambulatory Visit: Payer: Self-pay

## 2021-02-24 ENCOUNTER — Ambulatory Visit (INDEPENDENT_AMBULATORY_CARE_PROVIDER_SITE_OTHER): Payer: Medicare Other | Admitting: Cardiology

## 2021-02-24 VITALS — BP 124/64 | HR 51 | Ht 72.0 in | Wt 207.6 lb

## 2021-02-24 DIAGNOSIS — I480 Paroxysmal atrial fibrillation: Secondary | ICD-10-CM

## 2021-02-24 NOTE — Progress Notes (Signed)
Electrophysiology Office Note   Date:  02/24/2021   ID:  Alvino, Lechuga 1934/04/13, MRN [id number]  PCP:  [name], MD  Cardiologist:   Primary Electrophysiologist:  Sofiya Ezelle Meredith Leeds, MD    No chief complaint on file.    History of Present Illness: David Bush is a 87 y.o. male who is being seen today for the evaluation of bradycardia, palpitations at the request of [name], MD. Presenting today for electrophysiology evaluation.    He has a history of Barrett's esophagus, CKD stage III, hypertension, hyperlipidemia.  He also has atrial fibrillation on flecainide.  Today, denies symptoms of palpitations, chest pain, shortness of breath, orthopnea, PND, lower extremity edema, claudication, dizziness, presyncope, syncope, bleeding, or neurologic sequela. The patient is tolerating medications without difficulties.  He has been feeling well.  He has no chest pain or shortness of breath.  He is currently feeling well.  He has been playing golf and has not had any further issues.  Past Medical History:  Diagnosis Date  . Atrial fibrillation ([place])   . Barrett's esophagus   . Cataract    surgery to remove bilateral  . Chest pain    in feb 2020/ saw cardiologist- no heart attack!  . CKD (chronic kidney disease) stage 3, GFR 30-59 ml/min (HCC)   . GERD (gastroesophageal reflux disease)   . Hx of hiatal hernia   . Hyperlipidemia   . Hypertension   . Skin cancer    squamous cell of face/arm/legs  . Substance abuse ([place])    stopped drinking in 1993   Past Surgical History:  Procedure Laterality Date  . CATARACT EXTRACTION, BILATERAL  2012  . COLONOSCOPY    . KNEE ARTHROSCOPY Left 2010   torn meniscus  . SKIN CANCER EXCISION  2015, 2012   face, arms and legs  . UPPER GASTROINTESTINAL ENDOSCOPY     barrett's esophagus  . [place] EXTRACTION  1980  . WISDOM TOOTH EXTRACTION       Current Outpatient Medications  Medication Sig  Dispense Refill  . acetaminophen (TYLENOL) 500 MG tablet Take 1,000 mg by mouth every 6 (six) hours as needed for moderate pain.    Marland Kitchen amLODipine (NORVASC) 10 MG tablet Take 1 tablet (10 mg total) by mouth daily. 180 tablet 2  . benazepril (LOTENSIN) 40 MG tablet TAKE 1 TABLET DAILY 90 tablet 3  . carvedilol (COREG) 6.25 MG tablet TAKE 1 TABLET TWICE A DAY 180 tablet 1  . ELIQUIS 2.5 MG TABS tablet TAKE 1 TABLET TWICE A DAY 180 tablet 3  . finasteride (PROSCAR) 5 MG tablet TAKE 1 TABLET DAILY (NEED OFFICE VISIT) 90 tablet 3  . flecainide (TAMBOCOR) 100 MG tablet Take 1 tablet (100 mg total) by mouth 2 (two) times daily. 180 tablet 3  . Multiple Vitamins-Minerals (CENTRUM SILVER PO) Take 1 tablet by mouth daily.    . nitroGLYCERIN (NITROSTAT) 0.4 MG SL tablet Place 1 tablet (0.4 mg total) under the tongue every 5 (five) minutes as needed for chest pain. 25 tablet 3  . pantoprazole (PROTONIX) 40 MG tablet TAKE 1 TABLET DAILY 90 tablet 3  . rosuvastatin (CRESTOR) 20 MG tablet TAKE 1 TABLET DAILY 90 tablet 3   No current facility-administered medications for this visit.    Allergies:   Diltiazem   Social History:  The patient  reports that he quit smoking about 54 years ago. His smoking use included cigarettes. He quit after 16.00 years  of use. He has never used smokeless tobacco. He reports that he does not drink alcohol and does not use drugs.   Family History:  The patient's family history includes Esophageal cancer in his mother; Heart disease in his brother; Kidney cancer in his sister; Kidney disease in his brother; Other in his brother; Stroke in his father.   ROS:  Please see the history of present illness.   Otherwise, review of systems is positive for none.   All other systems are reviewed and negative.   PHYSICAL EXAM: VS:  BP 124/64   Pulse (!) 51   Ht 6' (1.829 m)   Wt 207 lb 9.6 oz (94.2 kg)   SpO2 97%   BMI 28.16 kg/m  , BMI Body mass index is 28.16 kg/m. GEN: Well  nourished, well developed, in no acute distress  HEENT: normal  Neck: no JVD, carotid bruits, or masses Cardiac: RRR; no murmurs, rubs, or gallops,no edema  Respiratory:  clear to auscultation bilaterally, normal work of breathing GI: soft, nontender, nondistended, + BS MS: no deformity or atrophy  Skin: warm and dry Neuro:  Strength and sensation are intact Psych: euthymic mood, full affect  EKG:  EKG is ordered today. Personal review of the ekg ordered shows sinus rhythm  Recent Labs: 10/01/2020: ALT 13; BUN 21; Creat 1.54; Hemoglobin 13.2; Platelets 189; Potassium 4.5; Sodium 137    Lipid Panel     Component Value Date/Time   CHOL 103 10/01/2020 0815   TRIG 114 10/01/2020 0815   HDL 38 (L) 10/01/2020 0815   CHOLHDL 2.7 10/01/2020 0815   VLDL 42 (H) 08/20/2016 1105   LDLCALC 45 10/01/2020 0815     Wt Readings from Last 3 Encounters:  02/24/21 207 lb 9.6 oz (94.2 kg)  10/03/20 201 lb (91.2 kg)  08/20/20 203 lb 6.4 oz (92.3 kg)      Other studies Reviewed: Additional studies/ records that were reviewed today include: Epic notes   Holter monitor 01/05/18-personally reviewed Minimum heart rate 41 bpm at 2:25 AM Heart rate 144 bpm 6:05 PM Average heart rate 54 bpm Sinus rhythm with APCs and PVCs Wide-complex tachycardia, irregular, likely atrial fibrillation  ASSESSMENT AND PLAN:  1.  Hypertension: Currently well controlled  2.  Hyperlipidemia: Continue Zocor  3.  Paroxysmal atrial fibrillation: Currently on flecainide, Eliquis, diltiazem.  CHA2DS2-VASc of 3.  High risk medication monitoring.  Has remained in sinus rhythm without issue.  No changes at this time.     Current medicines are reviewed at length with the patient today.   The patient does not have concerns regarding his medicines.  The following changes were made today: None  Labs/ tests ordered today include:  Orders Placed This Encounter  Procedures  . EKG 12-Lead     Disposition:   FU with  David Bush 6 months  Signed, David Crystal Meredith Leeds, MD  02/24/2021 11:03 AM     [facility] [address] [place] [address] [phone] (office) [phone] (fax)

## 2021-03-06 DIAGNOSIS — D225 Melanocytic nevi of trunk: Secondary | ICD-10-CM | POA: Diagnosis not present

## 2021-03-06 DIAGNOSIS — L578 Other skin changes due to chronic exposure to nonionizing radiation: Secondary | ICD-10-CM | POA: Diagnosis not present

## 2021-03-06 DIAGNOSIS — D045 Carcinoma in situ of skin of trunk: Secondary | ICD-10-CM | POA: Diagnosis not present

## 2021-03-06 DIAGNOSIS — L814 Other melanin hyperpigmentation: Secondary | ICD-10-CM | POA: Diagnosis not present

## 2021-03-06 DIAGNOSIS — L821 Other seborrheic keratosis: Secondary | ICD-10-CM | POA: Diagnosis not present

## 2021-03-06 DIAGNOSIS — C44329 Squamous cell carcinoma of skin of other parts of face: Secondary | ICD-10-CM | POA: Diagnosis not present

## 2021-03-06 DIAGNOSIS — L723 Sebaceous cyst: Secondary | ICD-10-CM | POA: Diagnosis not present

## 2021-03-06 DIAGNOSIS — D485 Neoplasm of uncertain behavior of skin: Secondary | ICD-10-CM | POA: Diagnosis not present

## 2021-03-06 DIAGNOSIS — L570 Actinic keratosis: Secondary | ICD-10-CM | POA: Diagnosis not present

## 2021-03-06 DIAGNOSIS — Z85828 Personal history of other malignant neoplasm of skin: Secondary | ICD-10-CM | POA: Diagnosis not present

## 2021-03-09 ENCOUNTER — Other Ambulatory Visit: Payer: Self-pay | Admitting: Cardiology

## 2021-03-09 DIAGNOSIS — I480 Paroxysmal atrial fibrillation: Secondary | ICD-10-CM

## 2021-03-23 DIAGNOSIS — H35033 Hypertensive retinopathy, bilateral: Secondary | ICD-10-CM | POA: Diagnosis not present

## 2021-03-23 DIAGNOSIS — Z961 Presence of intraocular lens: Secondary | ICD-10-CM | POA: Diagnosis not present

## 2021-03-23 DIAGNOSIS — H43813 Vitreous degeneration, bilateral: Secondary | ICD-10-CM | POA: Diagnosis not present

## 2021-03-23 DIAGNOSIS — H0100A Unspecified blepharitis right eye, upper and lower eyelids: Secondary | ICD-10-CM | POA: Diagnosis not present

## 2021-03-26 DIAGNOSIS — H6983 Other specified disorders of Eustachian tube, bilateral: Secondary | ICD-10-CM | POA: Diagnosis not present

## 2021-04-13 ENCOUNTER — Other Ambulatory Visit (HOSPITAL_BASED_OUTPATIENT_CLINIC_OR_DEPARTMENT_OTHER): Payer: Self-pay

## 2021-04-13 ENCOUNTER — Ambulatory Visit: Payer: Medicare Other | Attending: Internal Medicine

## 2021-04-13 ENCOUNTER — Other Ambulatory Visit: Payer: Self-pay

## 2021-04-13 DIAGNOSIS — Z23 Encounter for immunization: Secondary | ICD-10-CM

## 2021-04-13 MED ORDER — PFIZER-BIONT COVID-19 VAC-TRIS 30 MCG/0.3ML IM SUSP
INTRAMUSCULAR | 0 refills | Status: DC
  Filled 2021-04-13: qty 0.3, 1d supply, fill #0

## 2021-04-13 NOTE — Progress Notes (Signed)
   Covid-19 Vaccination Clinic  Name:  David Bush    MRN: [id number] DOB: [birth year]  04/13/2021  [name] was observed post Covid-19 immunization for 15 minutes without incident. He was provided with Vaccine Information Sheet and instruction to access the V-Safe system.   [name] was instructed to call 911 with any severe reactions post vaccine: Marland Kitchen Difficulty breathing  . Swelling of face and throat  . A fast heartbeat  . A bad rash all over body  . Dizziness and weakness   Immunizations Administered    Name Date Dose VIS Date Route   PFIZER Comrnaty(Gray TOP) Covid-19 Vaccine 04/13/2021  9:45 AM 0.3 mL 11/13/2020 Intramuscular   Manufacturer: [employer]   Lot: [id number]   NDC: [phone]

## 2021-04-20 ENCOUNTER — Telehealth: Payer: Self-pay | Admitting: Cardiology

## 2021-04-20 NOTE — Telephone Encounter (Signed)
Pt reports that he has his Flecainide/Eliquis/Carvedilol, he however does NOT have his other daily medications. Pt advised to call Dr. Samella Parr office to discuss. Reports he hasn't had them in 3 days and doing good so far, recommended pt check his BP and make sure it is controlled. Advised that if symptoms begin (ex: HA) to check BP and call PCP to discuss sending enough medication to get him through the remainder of his vacation (3 more day). Patient verbalized understanding and agreeable to plan.

## 2021-04-20 NOTE — Telephone Encounter (Signed)
   Pt c/o medication issue:  1. Name of Medication: some of his heart meds  2. How are you currently taking this medication (dosage and times per day)?   3. Are you having a reaction (difficulty breathing--STAT)?  4. What is your medication issue? Pt said he is in [place] in 3 days and forgot some of his medication, he requested to speak with a nurse to ask if its ok for him to skip his meds for 3 days. He said to call him back on his cell #

## 2021-04-28 DIAGNOSIS — L570 Actinic keratosis: Secondary | ICD-10-CM | POA: Diagnosis not present

## 2021-04-28 DIAGNOSIS — D045 Carcinoma in situ of skin of trunk: Secondary | ICD-10-CM | POA: Diagnosis not present

## 2021-05-08 DIAGNOSIS — C44329 Squamous cell carcinoma of skin of other parts of face: Secondary | ICD-10-CM | POA: Diagnosis not present

## 2021-06-09 DIAGNOSIS — L570 Actinic keratosis: Secondary | ICD-10-CM | POA: Diagnosis not present

## 2021-07-02 ENCOUNTER — Telehealth: Payer: Self-pay | Admitting: Cardiology

## 2021-07-02 NOTE — Telephone Encounter (Signed)
Pt just wanted [place] to know what occurred. Reports that he was playing golf when started, he only played 9 holes d/t symptom. Temperature was 91. BPs normal at home (129/72, 130/65, 131/79, 147/64), but was low after dizzy feeling. Pt understands most likely r/t overheating.   He will continue to monitor and let  us know if reoccurs

## 2021-07-02 NOTE — Telephone Encounter (Signed)
Pt c/o BP issue: STAT if pt c/o blurred vision, one-sided weakness or slurred speech  1. What are your last 5 BP readings? 93/41 Pulse 46 at around 12:30 pm today  2. Are you having any other symptoms (ex. Dizziness, headache, blurred vision, passed out)? Pt was playing golf  early this morning and had blurred vision but he did not pass out. Pt came home and took a shower and now he feels good. Pt just wanted to let Dr. Curt Bears know  3. What is your BP issue? No other BP readings. Pt is at home resting and he feels fine

## 2021-07-09 ENCOUNTER — Telehealth: Payer: Self-pay | Admitting: Cardiology

## 2021-07-09 NOTE — Telephone Encounter (Signed)
Pt called in last week with low BP after being out on the golf course.  Was on the golf course again today and had to come home after 11 holes due to being very dizzy and feeling like he was going to pass out.  BP was 108/52 when he got home.  After an hour it came up to 119/59.  States dizziness was worse with position changes (bending to standing) but by the time he got through the 11th hole, it was more consistent regardless of what he was doing.  Drank 4 bottles of water on the front 9.  Worried this maybe related to medications.  States this didn't start until he was started on Eliquis and Flecainide.  Denies CP or SOB.  Current meds include:  Amlodipine '10mg'$  QD Benazepril '40mg'$  QD  Coreg 6.'25mg'$  BID Flecainide '100mg'$  BID  Will route to Dr. Curt Bears for review and advisement.

## 2021-07-09 NOTE — Telephone Encounter (Signed)
Pt c/o BP issue: STAT if pt c/o blurred vision, one-sided weakness or slurred speech  1. What are your last 5 BP readings? 108/52 hp 58 119/59   2. Are you having any other symptoms (ex. Dizziness, headache, blurred vision, passed out)? Dizziness, feeling of passing out  3. What is your BP issue? Low. He called last week and he stated the nurse told him if it happen again then to give [place] a call back

## 2021-07-10 MED ORDER — AMLODIPINE BESYLATE 5 MG PO TABS: 5.0000 mg | ORAL_TABLET | Freq: Every day | ORAL | 2 refills | Status: DC

## 2021-07-10 NOTE — Telephone Encounter (Signed)
Pt advised to decrease Amlodipine to 5 mg once daily per Dr. Curt Bears. Patient verbalized understanding and agreeable to plan.  Advised to call office if no improvement after decrease.

## 2021-07-24 DIAGNOSIS — L570 Actinic keratosis: Secondary | ICD-10-CM | POA: Diagnosis not present

## 2021-07-24 DIAGNOSIS — C44722 Squamous cell carcinoma of skin of right lower limb, including hip: Secondary | ICD-10-CM | POA: Diagnosis not present

## 2021-07-24 DIAGNOSIS — D485 Neoplasm of uncertain behavior of skin: Secondary | ICD-10-CM | POA: Diagnosis not present

## 2021-07-27 ENCOUNTER — Other Ambulatory Visit: Payer: Self-pay | Admitting: Family Medicine

## 2021-07-31 ENCOUNTER — Other Ambulatory Visit: Payer: Self-pay | Admitting: Cardiology

## 2021-08-04 ENCOUNTER — Other Ambulatory Visit: Payer: Self-pay | Admitting: Family Medicine

## 2021-08-24 ENCOUNTER — Encounter: Payer: Self-pay | Admitting: Cardiology

## 2021-08-24 ENCOUNTER — Other Ambulatory Visit: Payer: Self-pay

## 2021-08-24 ENCOUNTER — Ambulatory Visit (INDEPENDENT_AMBULATORY_CARE_PROVIDER_SITE_OTHER): Payer: Medicare Other | Admitting: Cardiology

## 2021-08-24 VITALS — BP 118/68 | HR 51 | Ht 72.0 in | Wt 202.2 lb

## 2021-08-24 DIAGNOSIS — I480 Paroxysmal atrial fibrillation: Secondary | ICD-10-CM | POA: Diagnosis not present

## 2021-08-24 MED ORDER — METOPROLOL SUCCINATE ER 50 MG PO TB24: 50.0000 mg | ORAL_TABLET | Freq: Every day | ORAL | 2 refills | Status: DC

## 2021-08-24 NOTE — Progress Notes (Signed)
Electrophysiology Office Note   Date:  08/24/2021   ID:  David Bush, DOB 1934-08-26, MRN [id number]  PCP:  [name], MD  Cardiologist:   Primary Electrophysiologist:  Dreama Kuna Meredith Leeds, MD    No chief complaint on file.    History of Present Illness: David Bush is a 87 y.o. male who is being seen today for the evaluation of bradycardia, palpitations at the request of [name], MD. Presenting today for electrophysiology evaluation.    He has a history significant for Barrett's esophagus, CKD stage III, hypertension, and hyperlipidemia.  He also has atrial fibrillation and is on flecainide.  Today, denies symptoms of palpitations, chest pain, shortness of breath, orthopnea, PND, lower extremity edema, claudication, dizziness, presyncope, syncope, bleeding, or neurologic sequela. The patient is tolerating medications without difficulties.  Since being seen he has done well.  He has no chest pain or shortness of breath.  Activities without restriction.  Unfortunately, he notes that he is blood pressure goes down at times.  He states that he gets pain in the back of his neck.  He only checks his blood pressure systolics down to the [id number].  He feels tired when this occurs.  Otherwise he is doing well.  He continues to play golf, showing below his age consistently.  Past Medical History:  Diagnosis Date   Atrial fibrillation ([place])    Barrett's esophagus    Cataract    surgery to remove bilateral   Chest pain    in feb 2020/ saw cardiologist- no heart attack!   CKD (chronic kidney disease) stage 3, GFR 30-59 ml/min (HCC)    GERD (gastroesophageal reflux disease)    Hx of hiatal hernia    Hyperlipidemia    Hypertension    Skin cancer    squamous cell of face/arm/legs   Substance abuse ([place])    stopped drinking in 1993   Past Surgical History:  Procedure Laterality Date   CATARACT EXTRACTION, BILATERAL  2012   COLONOSCOPY     KNEE ARTHROSCOPY  Left 2010   torn meniscus   SKIN CANCER EXCISION  2015, 2012   face, arms and legs   UPPER GASTROINTESTINAL ENDOSCOPY     barrett's esophagus   WISDOM TOOTH EXTRACTION  1980   WISDOM TOOTH EXTRACTION       Current Outpatient Medications  Medication Sig Dispense Refill   acetaminophen (TYLENOL) 500 MG tablet Take 1,000 mg by mouth every 6 (six) hours as needed for moderate pain.     amLODipine (NORVASC) 5 MG tablet Take 1 tablet (5 mg total) by mouth daily. 90 tablet 2   benazepril (LOTENSIN) 40 MG tablet TAKE 1 TABLET DAILY 90 tablet 3   COVID-19 mRNA Vac-TriS, Pfizer, (PFIZER-BIONT COVID-19 VAC-TRIS) SUSP injection Inject into the muscle. 0.3 mL 0   ELIQUIS 2.5 MG TABS tablet TAKE 1 TABLET TWICE A DAY 180 tablet 3   finasteride (PROSCAR) 5 MG tablet TAKE 1 TABLET DAILY (NEED OFFICE VISIT) 90 tablet 3   flecainide (TAMBOCOR) 100 MG tablet TAKE 1 TABLET TWICE A DAY 180 tablet 3   metoprolol succinate (TOPROL-XL) 50 MG 24 hr tablet Take 1 tablet (50 mg total) by mouth daily. Take with or immediately following a meal. 90 tablet 2   Multiple Vitamins-Minerals (CENTRUM SILVER PO) Take 1 tablet by mouth daily.     nitroGLYCERIN (NITROSTAT) 0.4 MG SL tablet Place 1 tablet (0.4 mg total) under the tongue every 5 (five) minutes as  needed for chest pain. 25 tablet 3   pantoprazole (PROTONIX) 40 MG tablet TAKE 1 TABLET DAILY 90 tablet 3   rosuvastatin (CRESTOR) 20 MG tablet TAKE 1 TABLET DAILY 90 tablet 3   No current facility-administered medications for this visit.    Allergies:   Diltiazem   Social History:  The patient  reports that he quit smoking about 54 years ago. His smoking use included cigarettes. He has never used smokeless tobacco. He reports that he does not drink alcohol and does not use drugs.   Family History:  The patient's family history includes Esophageal cancer in his mother; Heart disease in his brother; Kidney cancer in his sister; Kidney disease in his brother; Other in  his brother; Stroke in his father.   ROS:  Please see the history of present illness.   Otherwise, review of systems is positive for none.   All other systems are reviewed and negative.   PHYSICAL EXAM: VS:  BP 118/68   Pulse (!) 51   Ht 6' (1.829 m)   Wt 202 lb 3.2 oz (91.7 kg)   SpO2 98%   BMI 27.42 kg/m  , BMI Body mass index is 27.42 kg/m. GEN: Well nourished, well developed, in no acute distress  HEENT: normal  Neck: no JVD, carotid bruits, or masses Cardiac: RRR; no murmurs, rubs, or gallops,no edema  Respiratory:  clear to auscultation bilaterally, normal work of breathing GI: soft, nontender, nondistended, + BS MS: no deformity or atrophy  Skin: warm and dry Neuro:  Strength and sensation are intact Psych: euthymic mood, full affect  EKG:  EKG is ordered today. Personal review of the ekg ordered shows sinus rhythm, rate 51  Recent Labs: 10/01/2020: ALT 13; BUN 21; Creat 1.54; Hemoglobin 13.2; Platelets 189; Potassium 4.5; Sodium 137    Lipid Panel     Component Value Date/Time   CHOL 103 10/01/2020 0815   TRIG 114 10/01/2020 0815   HDL 38 (L) 10/01/2020 0815   CHOLHDL 2.7 10/01/2020 0815   VLDL 42 (H) 08/20/2016 1105   LDLCALC 45 10/01/2020 0815     Wt Readings from Last 3 Encounters:  08/24/21 202 lb 3.2 oz (91.7 kg)  02/24/21 207 lb 9.6 oz (94.2 kg)  10/03/20 201 lb (91.2 kg)      Other studies Reviewed: Additional studies/ records that were reviewed today include: Epic notes   Holter monitor 01/05/18-personally reviewed Minimum heart rate 41 bpm at 2:25 AM Heart rate 144 bpm 6:05 PM Average heart rate 54 bpm Sinus rhythm with APCs and PVCs Wide-complex tachycardia, irregular, likely atrial fibrillation  ASSESSMENT AND PLAN:  1.  Paroxysmal atrial fibrillation: Currently on flecainide 100 mg twice daily, carvedilol 6.25 mg twice daily, Eliquis 2.5 mg twice daily.  CHA2DS2-VASc of high risk medication monitoring via ECG.  He is having some low  blood pressures.  This could be related to his carvedilol.  We Shereda Graw switch from carvedilol to Toprol-XL 50 mg.  He Micah Galeno take this at night.  2.  Hypertension: Currently well controlled.  Changing medications as above.  3.  Hyperlipidemia: Continue Crestor 20 mg per primary physician.  Current medicines are reviewed at length with the patient today.   The patient does not have concerns regarding his medicines.  The following changes were made today: Stop carvedilol, start Toprol-XL  Labs/ tests ordered today include:  Orders Placed This Encounter  Procedures   EKG 12-Lead      Disposition:   FU with  Seymone Forlenza 6 months  Signed, Bronwen Pendergraft Meredith Leeds, MD  08/24/2021 11:43 AM     [facility] [address] [place] [address] [phone] (office) [phone] (fax)

## 2021-08-24 NOTE — Patient Instructions (Signed)
Medication Instructions:  Your physician has recommended you make the following change in your medication: STOP Carvedilol START Toprol 50 mg once daily  *If you need a refill on your cardiac medications before your next appointment, please call your pharmacy*   Lab Work: You will need the following blood work at PCP next month:  BMET & CBC  If you have labs (blood work) drawn today and your tests are completely normal, you will receive your results only by: MyChart Message (if you have MyChart) OR A paper copy in the mail If you have any lab test that is abnormal or we need to change your treatment, we will call you to review the results.   Testing/Procedures: None ordered   Follow-Up: At [facility], you and your health needs are our priority.  As part of our continuing mission to provide you with exceptional heart care, we have created designated Provider Care Teams.  These Care Teams include your primary Cardiologist (physician) and Advanced Practice Providers (APPs -  Physician Assistants and Nurse Practitioners) who all work together to provide you with the care you need, when you need it.  Your next appointment:   6 month(s)  The format for your next appointment:   In Person  Provider:   Allegra Lai, MD    Thank you for choosing [facility]!!   Trinidad Curet, RN [phone]   Other Instructions

## 2021-09-02 ENCOUNTER — Telehealth: Payer: Self-pay | Admitting: Cardiology

## 2021-09-02 DIAGNOSIS — C44722 Squamous cell carcinoma of skin of right lower limb, including hip: Secondary | ICD-10-CM | POA: Diagnosis not present

## 2021-09-02 DIAGNOSIS — C44622 Squamous cell carcinoma of skin of right upper limb, including shoulder: Secondary | ICD-10-CM | POA: Diagnosis not present

## 2021-09-02 DIAGNOSIS — D485 Neoplasm of uncertain behavior of skin: Secondary | ICD-10-CM | POA: Diagnosis not present

## 2021-09-02 MED ORDER — METOPROLOL SUCCINATE ER 25 MG PO TB24: 25.0000 mg | ORAL_TABLET | Freq: Every day | ORAL | 3 refills | Status: DC

## 2021-09-02 NOTE — Telephone Encounter (Signed)
STAT if HR is under 50 or over 120 (normal HR is 60-100 beats per minute)  What is your heart rate? 43  Do you have a log of your heart rate readings (document readings)? 43, 44  Do you have any other symptoms? No. Patient states he played golf yesterday and he felt great.  Pt c/o medication issue:  1. Name of Medication: metoprolol succinate (TOPROL-XL) 50 MG 24 hr tablet   2. How are you currently taking this medication (dosage and times per day)? As directed   3. Are you having a reaction (difficulty breathing--STAT)? Low HR and low BP   4. What is your medication issue? Patient is concerned about how low his HR has been since he started the medicine. He wanted to know if this is normal.

## 2021-09-02 NOTE — Telephone Encounter (Signed)
Pt not home, spoke to wife (dpr on file). Instructed to have pt decrease Toprol to 25 mg at bedtime, per Dr. Curt Bears. Aware to let us know by next week how he is doing prior to sending in updated Rx to pharmacy. Wife agreeable to plan.

## 2021-09-02 NOTE — Telephone Encounter (Signed)
Pt reports HRs not above 50 since starting the Toprol BP today 107/46, HR 43. Pt asymptomatic. Denies SOB, light headedness. Played golf yesterday without issues.  Pt advised to decrease to Toprol to 25 mg at bedtime until hears back from office.  Will route to Dr. Curt Bears for FYI/advisement. Pt aware I will call once reviewed by MD. Pt agreeable to plan.

## 2021-09-10 DIAGNOSIS — H01021 Squamous blepharitis right upper eyelid: Secondary | ICD-10-CM | POA: Diagnosis not present

## 2021-09-10 DIAGNOSIS — H01024 Squamous blepharitis left upper eyelid: Secondary | ICD-10-CM | POA: Diagnosis not present

## 2021-09-16 ENCOUNTER — Encounter: Payer: Self-pay | Admitting: Cardiology

## 2021-09-16 NOTE — Telephone Encounter (Signed)
error 

## 2021-09-16 NOTE — Telephone Encounter (Signed)
Patient calling back. He says his HR for the whole week had low of 47 and a high of 51. He says his BP has been fine, but the HR was low.  And this week too. BP was fine

## 2021-09-18 DIAGNOSIS — C44622 Squamous cell carcinoma of skin of right upper limb, including shoulder: Secondary | ICD-10-CM | POA: Diagnosis not present

## 2021-09-22 DIAGNOSIS — C44622 Squamous cell carcinoma of skin of right upper limb, including shoulder: Secondary | ICD-10-CM | POA: Diagnosis not present

## 2021-09-25 DIAGNOSIS — Z85828 Personal history of other malignant neoplasm of skin: Secondary | ICD-10-CM | POA: Diagnosis not present

## 2021-09-25 DIAGNOSIS — L821 Other seborrheic keratosis: Secondary | ICD-10-CM | POA: Diagnosis not present

## 2021-09-25 DIAGNOSIS — D225 Melanocytic nevi of trunk: Secondary | ICD-10-CM | POA: Diagnosis not present

## 2021-09-25 DIAGNOSIS — L570 Actinic keratosis: Secondary | ICD-10-CM | POA: Diagnosis not present

## 2021-09-25 DIAGNOSIS — L720 Epidermal cyst: Secondary | ICD-10-CM | POA: Diagnosis not present

## 2021-09-25 DIAGNOSIS — L578 Other skin changes due to chronic exposure to nonionizing radiation: Secondary | ICD-10-CM | POA: Diagnosis not present

## 2021-09-29 DIAGNOSIS — H01021 Squamous blepharitis right upper eyelid: Secondary | ICD-10-CM | POA: Diagnosis not present

## 2021-09-29 DIAGNOSIS — Z961 Presence of intraocular lens: Secondary | ICD-10-CM | POA: Diagnosis not present

## 2021-09-29 DIAGNOSIS — H01024 Squamous blepharitis left upper eyelid: Secondary | ICD-10-CM | POA: Diagnosis not present

## 2021-09-30 ENCOUNTER — Other Ambulatory Visit: Payer: Self-pay

## 2021-09-30 ENCOUNTER — Other Ambulatory Visit: Payer: Self-pay | Admitting: Cardiology

## 2021-09-30 ENCOUNTER — Other Ambulatory Visit: Payer: Medicare Other

## 2021-09-30 DIAGNOSIS — E7800 Pure hypercholesterolemia, unspecified: Secondary | ICD-10-CM | POA: Diagnosis not present

## 2021-09-30 DIAGNOSIS — Z125 Encounter for screening for malignant neoplasm of prostate: Secondary | ICD-10-CM

## 2021-09-30 DIAGNOSIS — Z13220 Encounter for screening for lipoid disorders: Secondary | ICD-10-CM

## 2021-09-30 DIAGNOSIS — N183 Chronic kidney disease, stage 3 unspecified: Secondary | ICD-10-CM

## 2021-09-30 DIAGNOSIS — I480 Paroxysmal atrial fibrillation: Secondary | ICD-10-CM

## 2021-09-30 DIAGNOSIS — I10 Essential (primary) hypertension: Secondary | ICD-10-CM | POA: Diagnosis not present

## 2021-09-30 DIAGNOSIS — Z136 Encounter for screening for cardiovascular disorders: Secondary | ICD-10-CM | POA: Diagnosis not present

## 2021-09-30 NOTE — Telephone Encounter (Signed)
Prescription refill request for Eliquis received. Indication: Afib  Last office visit:08/24/21 ([place]) Scr:1.54  (10/01/20) Age: 87 Weight: 91.7kg  Appropriate dose and refill sent to requested pharmacy.

## 2021-10-01 LAB — COMPLETE METABOLIC PANEL WITH GFR
AG Ratio: 2 (calc) (ref 1.0–2.5)
ALT: 14 U/L (ref 9–46)
AST: 17 U/L (ref 10–35)
Albumin: 4.2 g/dL (ref 3.6–5.1)
Alkaline phosphatase (APISO): 56 U/L (ref 35–144)
BUN/Creatinine Ratio: 14 (calc) (ref 6–22)
BUN: 18 mg/dL (ref 7–25)
CO2: 24 mmol/L (ref 20–32)
Calcium: 9.3 mg/dL (ref 8.6–10.3)
Chloride: 104 mmol/L (ref 98–110)
Creat: 1.33 mg/dL — ABNORMAL HIGH (ref 0.70–1.22)
Globulin: 2.1 g/dL (calc) (ref 1.9–3.7)
Glucose, Bld: 99 mg/dL (ref 65–99)
Potassium: 4.6 mmol/L (ref 3.5–5.3)
Sodium: 139 mmol/L (ref 135–146)
Total Bilirubin: 0.5 mg/dL (ref 0.2–1.2)
Total Protein: 6.3 g/dL (ref 6.1–8.1)
eGFR: 52 mL/min/{1.73_m2} — ABNORMAL LOW (ref >=60)

## 2021-10-01 LAB — CBC WITH DIFFERENTIAL/PLATELET
Absolute Monocytes: 569 cells/uL (ref 200–950)
Basophils Absolute: 50 cells/uL (ref 0–200)
Basophils Relative: 0.7 %
Eosinophils Absolute: 216 cells/uL (ref 15–500)
Eosinophils Relative: 3 %
HCT: 40.4 % (ref 38.5–50.0)
Hemoglobin: 13.2 g/dL (ref 13.2–17.1)
Lymphs Abs: 1426 cells/uL (ref 850–3900)
MCH: 30.4 pg (ref 27.0–33.0)
MCHC: 32.7 g/dL (ref 32.0–36.0)
MCV: 93.1 fL (ref 80.0–100.0)
MPV: 9.9 fL (ref 7.5–12.5)
Monocytes Relative: 7.9 %
Neutro Abs: 4939 cells/uL (ref 1500–7800)
Neutrophils Relative %: 68.6 %
Platelets: 196 10*3/uL (ref 140–400)
RBC: 4.34 10*6/uL (ref 4.20–5.80)
RDW: 12.2 % (ref 11.0–15.0)
Total Lymphocyte: 19.8 %
WBC: 7.2 10*3/uL (ref 3.8–10.8)

## 2021-10-01 LAB — LIPID PANEL
Cholesterol: 110 mg/dL (ref <200)
HDL: 40 mg/dL (ref >=40)
LDL Cholesterol (Calc): 50 mg/dL (calc)
Non-HDL Cholesterol (Calc): 70 mg/dL (calc) (ref <130)
Total CHOL/HDL Ratio: 2.8 (calc) (ref <5.0)
Triglycerides: 116 mg/dL (ref <150)

## 2021-10-01 LAB — PSA: PSA: 1.98 ng/mL (ref <=4.00)

## 2021-10-05 ENCOUNTER — Ambulatory Visit (INDEPENDENT_AMBULATORY_CARE_PROVIDER_SITE_OTHER): Payer: Medicare Other | Admitting: Family Medicine

## 2021-10-05 ENCOUNTER — Other Ambulatory Visit: Payer: Self-pay

## 2021-10-05 ENCOUNTER — Encounter: Payer: Self-pay | Admitting: Family Medicine

## 2021-10-05 VITALS — BP 128/74 | HR 62 | Temp 98.5°F | Resp 18 | Ht 72.0 in | Wt 201.0 lb

## 2021-10-05 DIAGNOSIS — Z0001 Encounter for general adult medical examination with abnormal findings: Secondary | ICD-10-CM

## 2021-10-05 DIAGNOSIS — Z0000 Encounter for general adult medical examination without abnormal findings: Secondary | ICD-10-CM

## 2021-10-05 DIAGNOSIS — I480 Paroxysmal atrial fibrillation: Secondary | ICD-10-CM | POA: Diagnosis not present

## 2021-10-05 DIAGNOSIS — Z23 Encounter for immunization: Secondary | ICD-10-CM | POA: Diagnosis not present

## 2021-10-05 DIAGNOSIS — E7800 Pure hypercholesterolemia, unspecified: Secondary | ICD-10-CM | POA: Diagnosis not present

## 2021-10-05 DIAGNOSIS — I10 Essential (primary) hypertension: Secondary | ICD-10-CM

## 2021-10-05 DIAGNOSIS — N183 Chronic kidney disease, stage 3 unspecified: Secondary | ICD-10-CM | POA: Diagnosis not present

## 2021-10-05 NOTE — Progress Notes (Signed)
Subjective:    Patient ID: David Bush, male    DOB: [birth year], 87 y.o.   MRN: [id number]  HPI Patient is here today for complete physical exam.  Given his age, he does not require colonoscopy.  I would also recommend against prostate cancer screening although he recently inadvertently had a PSA that checked out to be 1.98 and reassuring.  His immunizations are listed below.  He is due for a flu shot, shingles vaccine, and a COVID booster.  He denies any falls, memory loss, or depression.  Overall he is doing well.  His creatinine has improved and his GFR is back up to 51 compared to his GFR of last year which was 42.  Overall he is doing well with no concerns Immunization History  Administered Date(s) Administered   Fluad Quad(high Dose 65+) 09/28/2019, 09/01/2020   Influenza, High Dose Seasonal PF 10/02/1996, 08/25/2017   Influenza,inj,Quad PF,6+ Mos 08/31/2016, 08/30/2018   Influenza-Unspecified 10/06/1997, 10/03/1998, 11/21/2000, 10/13/2001, 10/04/2002, 09/12/2003, 10/07/2004, 09/05/2013   PFIZER Comirnaty(Gray Top)Covid-19 Tri-Sucrose Vaccine 04/13/2021   PFIZER(Purple Top)SARS-COV-2 Vaccination 12/31/2019, 01/21/2020, 09/08/2020   Pneumococcal Conjugate-13 10/01/2016   Pneumococcal Polysaccharide-23 12/06/2013   Pneumococcal-Unspecified 08/28/2001   Covid vaccines are up-to-date and he recently had his booster Lab on 09/30/2021  Component Date Value Ref Range Status   WBC 09/30/2021 7.2  3.8 - 10.8 Thousand/uL Final   RBC 09/30/2021 4.34  4.20 - 5.80 Million/uL Final   Hemoglobin 09/30/2021 13.2  13.2 - 17.1 g/dL Final   HCT 09/30/2021 40.4  38.5 - 50.0 % Final   MCV 09/30/2021 93.1  80.0 - 100.0 fL Final   MCH 09/30/2021 30.4  27.0 - 33.0 pg Final   MCHC 09/30/2021 32.7  32.0 - 36.0 g/dL Final   RDW 09/30/2021 12.2  11.0 - 15.0 % Final   Platelets 09/30/2021 196  140 - 400 Thousand/uL Final   MPV 09/30/2021 9.9  7.5 - 12.5 fL Final   Neutro Abs 09/30/2021 4,939  1,500  - 7,800 cells/uL Final   Lymphs Abs 09/30/2021 1,426  850 - 3,900 cells/uL Final   Absolute Monocytes 09/30/2021 569  200 - 950 cells/uL Final   Eosinophils Absolute 09/30/2021 216  15 - 500 cells/uL Final   Basophils Absolute 09/30/2021 50  0 - 200 cells/uL Final   Neutrophils Relative % 09/30/2021 68.6  % Final   Total Lymphocyte 09/30/2021 19.8  % Final   Monocytes Relative 09/30/2021 7.9  % Final   Eosinophils Relative 09/30/2021 3.0  % Final   Basophils Relative 09/30/2021 0.7  % Final   Glucose, Bld 09/30/2021 99  65 - 99 mg/dL Final   Comment: .            Fasting reference interval .    BUN 09/30/2021 18  7 - 25 mg/dL Final   Creat 09/30/2021 1.33 (A)  0.70 - 1.22 mg/dL Final   eGFR 09/30/2021 52 (A)  > OR = 60 mL/min/1.78m Final   Comment: The eGFR is based on the CKD-EPI 2021 equation. To calculate  the new eGFR from a previous Creatinine or Cystatin C result, go to https://www.kidney.org/professionals/ kdoqi/gfr%5Fcalculator    BUN/Creatinine Ratio 09/30/2021 14  6 - 22 (calc) Final   Sodium 09/30/2021 139  135 - 146 mmol/L Final   Potassium 09/30/2021 4.6  3.5 - 5.3 mmol/L Final   Chloride 09/30/2021 104  98 - 110 mmol/L Final   CO2 09/30/2021 24  20 - 32 mmol/L Final   Calcium  09/30/2021 9.3  8.6 - 10.3 mg/dL Final   Total Protein 09/30/2021 6.3  6.1 - 8.1 g/dL Final   Albumin 09/30/2021 4.2  3.6 - 5.1 g/dL Final   Globulin 09/30/2021 2.1  1.9 - 3.7 g/dL (calc) Final   AG Ratio 09/30/2021 2.0  1.0 - 2.5 (calc) Final   Total Bilirubin 09/30/2021 0.5  0.2 - 1.2 mg/dL Final   Alkaline phosphatase (APISO) 09/30/2021 56  35 - 144 U/L Final   AST 09/30/2021 17  10 - 35 U/L Final   ALT 09/30/2021 14  9 - 46 U/L Final   Cholesterol 09/30/2021 110  <200 mg/dL Final   HDL 09/30/2021 40  > OR = 40 mg/dL Final   Triglycerides 09/30/2021 116  <150 mg/dL Final   LDL Cholesterol (Calc) 09/30/2021 50  mg/dL (calc) Final   Comment: Reference range: <100 . Desirable range <100  mg/dL for primary prevention;   <70 mg/dL for patients with CHD or diabetic patients  with > or = 2 CHD risk factors. Marland Kitchen LDL-C is now calculated using the Martin-Hopkins  calculation, which is a validated novel method providing  better accuracy than the Friedewald equation in the  estimation of LDL-C.  Cresenciano Genre et al. Annamaria Helling. 3267;124(58): 2061-2068  (http://education.[employer].com/faq/FAQ164)    Total CHOL/HDL Ratio 09/30/2021 2.8  <5.0 (calc) Final   Non-HDL Cholesterol (Calc) 09/30/2021 70  <130 mg/dL (calc) Final   Comment: For patients with diabetes plus 1 major ASCVD risk  factor, treating to a non-HDL-C goal of <100 mg/dL  (LDL-C of <70 mg/dL) is considered a therapeutic  option.    PSA 09/30/2021 1.98  < OR = 4.00 ng/mL Final   Comment: The total PSA value from this assay system is  standardized against the WHO standard. The test  result will be approximately 20% lower when compared  to the equimolar-standardized total PSA (Beckman  Coulter). Comparison of serial PSA results should be  interpreted with this fact in mind. . This test was performed using the Siemens  chemiluminescent method. Values obtained from  different assay methods cannot be used interchangeably. PSA levels, regardless of value, should not be interpreted as absolute evidence of the presence or absence of disease.     Past Medical History:  Diagnosis Date   Atrial fibrillation ([place])    Barrett's esophagus    Cataract    surgery to remove bilateral   Chest pain    in feb 2020/ saw cardiologist- no heart attack!   CKD (chronic kidney disease) stage 3, GFR 30-59 ml/min (HCC)    GERD (gastroesophageal reflux disease)    Hx of hiatal hernia    Hyperlipidemia    Hypertension    Skin cancer    squamous cell of face/arm/legs   Substance abuse ([place])    stopped drinking in 1993   Past Surgical History:  Procedure Laterality Date   CATARACT EXTRACTION, BILATERAL  2012   COLONOSCOPY      KNEE ARTHROSCOPY Left 2010   torn meniscus   SKIN CANCER EXCISION  2015, 2012   face, arms and legs   UPPER GASTROINTESTINAL ENDOSCOPY     barrett's esophagus   WISDOM TOOTH EXTRACTION  1980   WISDOM TOOTH EXTRACTION     Current Outpatient Medications on File Prior to Visit  Medication Sig Dispense Refill   amLODipine (NORVASC) 5 MG tablet Take 1 tablet (5 mg total) by mouth daily. 90 tablet 2   benazepril (LOTENSIN) 40 MG tablet TAKE 1 TABLET  DAILY 90 tablet 3   ELIQUIS 2.5 MG TABS tablet TAKE 1 TABLET TWICE A DAY 180 tablet 3   finasteride (PROSCAR) 5 MG tablet TAKE 1 TABLET DAILY (NEED OFFICE VISIT) 90 tablet 3   flecainide (TAMBOCOR) 100 MG tablet TAKE 1 TABLET TWICE A DAY 180 tablet 3   metoprolol succinate (TOPROL-XL) 25 MG 24 hr tablet Take 1 tablet (25 mg total) by mouth daily. Take with or immediately following a meal. 90 tablet 3   Multiple Vitamins-Minerals (CENTRUM SILVER PO) Take 1 tablet by mouth daily.     niacinamide 500 MG tablet Take 500 mg by mouth 2 (two) times daily with a meal.     nitroGLYCERIN (NITROSTAT) 0.4 MG SL tablet Place 1 tablet (0.4 mg total) under the tongue every 5 (five) minutes as needed for chest pain. 25 tablet 3   pantoprazole (PROTONIX) 40 MG tablet TAKE 1 TABLET DAILY 90 tablet 3   rosuvastatin (CRESTOR) 20 MG tablet TAKE 1 TABLET DAILY 90 tablet 3   vitamin B-12 (CYANOCOBALAMIN) 1000 MCG tablet Take 1,000 mcg by mouth daily.     No current facility-administered medications on file prior to visit.   Allergies  Allergen Reactions   Diltiazem Itching and Rash   Social History   Socioeconomic History   Marital status: Married    Spouse name: Not on file   Number of children: Not on file   Years of education: Not on file   Highest education level: Not on file  Occupational History   Not on file  Tobacco Use   Smoking status: Former    Years: 16.00    Types: Cigarettes    Quit date: 12/06/1966    Years since quitting: 54.8   Smokeless  tobacco: Never  Vaping Use   Vaping Use: Never used  Substance and Sexual Activity   Alcohol use: No    Alcohol/week: 0.0 standard drinks    Comment: 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   Drug use: No   Sexual activity: Not on file  Other Topics Concern   Not on file  Social History Narrative   Not on file   Social Determinants of Health   Financial Resource Strain: Not on file  Food Insecurity: Not on file  Transportation Needs: Not on file  Physical Activity: Not on file  Stress: Not on file  Social Connections: Not on file  Intimate Partner Violence: Not on file   Family History  Problem Relation Age of Onset   Esophageal cancer Mother    Stroke Father    Kidney cancer Sister    Kidney disease Brother    Other Brother    Heart disease Brother    Colon cancer Neg Hx    Colon polyps Neg Hx    Rectal cancer Neg Hx    Stomach cancer Neg Hx       Review of Systems  All other systems reviewed and are negative.     Objective:   Physical Exam Vitals reviewed.  Constitutional:      General: He is not in acute distress.    Appearance: He is well-developed. He is not diaphoretic.  HENT:     Head: Normocephalic and atraumatic.     Right Ear: External ear normal.     Left Ear: External ear normal.     Nose: Nose normal.     Mouth/Throat:     Pharynx: No oropharyngeal exudate.  Eyes:     General:  No scleral icterus.       Right eye: No discharge.        Left eye: No discharge.     Conjunctiva/sclera: Conjunctivae normal.     Pupils: Pupils are equal, round, and reactive to light.  Neck:     Thyroid: No thyromegaly.     Vascular: No JVD.     Trachea: No tracheal deviation.  Cardiovascular:     Rate and Rhythm: Normal rate and regular rhythm.     Heart sounds: Murmur heard.    No friction rub. No gallop.  Pulmonary:     Effort: Pulmonary effort is normal. No respiratory distress.     Breath sounds: Normal breath sounds. No stridor. No wheezing  or rales.  Chest:     Chest wall: No tenderness.  Abdominal:     General: Bowel sounds are normal. There is no distension.     Palpations: Abdomen is soft. There is no mass.     Tenderness: There is no abdominal tenderness. There is no guarding or rebound.  Genitourinary:    Penis: Normal.      Prostate: Normal.     Rectum: Normal.  Musculoskeletal:        General: No deformity.     Cervical back: Normal range of motion and neck supple.  Lymphadenopathy:     Cervical: No cervical adenopathy.  Skin:    General: Skin is warm.     Coloration: Skin is not pale.     Findings: No erythema or rash.  Neurological:     Mental Status: He is alert and oriented to person, place, and time.     Cranial Nerves: No cranial nerve deficit.     Motor: No abnormal muscle tone.     Coordination: Coordination normal.     Deep Tendon Reflexes: Reflexes are normal and symmetric.  Psychiatric:        Behavior: Behavior normal.        Thought Content: Thought content normal.        Judgment: Judgment normal.          Assessment & Plan:  General medical exam  Benign essential HTN  Pure hypercholesterolemia  Paroxysmal atrial fibrillation (HCC)  CRI (chronic renal insufficiency), stage 3 (moderate) (HCC) Patient's cardiac murmur sounds stable suggesting mild to moderate aortic stenosis.  He is asymptomatic.  Heart rate is currently well controlled.  He is in normal sinus rhythm today and he is appropriately anticoagulated for his atrial fibrillation.  His blood pressure today is excellent.  His renal function has actually improved compared to last year.  He received his flu shot today.  I recommended the shingles vaccine as well as a COVID booster.  He denies any falls or depression or memory loss

## 2021-11-17 ENCOUNTER — Telehealth: Payer: Self-pay | Admitting: *Deleted

## 2021-12-03 ENCOUNTER — Other Ambulatory Visit: Payer: Self-pay | Admitting: Family Medicine

## 2022-01-20 NOTE — Telephone Encounter (Signed)
error 

## 2022-01-23 ENCOUNTER — Other Ambulatory Visit: Payer: Self-pay | Admitting: Family Medicine

## 2022-01-25 ENCOUNTER — Other Ambulatory Visit: Payer: Self-pay

## 2022-02-07 ENCOUNTER — Emergency Department (HOSPITAL_COMMUNITY)
Admission: EM | Admit: 2022-02-07 | Discharge: 2022-02-08 | Disposition: A | Discharge status: Home / Self Care | Payer: Medicare Other | Attending: Emergency Medicine | Admitting: Emergency Medicine

## 2022-02-07 ENCOUNTER — Emergency Department (HOSPITAL_COMMUNITY): Payer: Medicare Other

## 2022-02-07 ENCOUNTER — Encounter (HOSPITAL_COMMUNITY): Payer: Self-pay

## 2022-02-07 ENCOUNTER — Other Ambulatory Visit: Payer: Self-pay

## 2022-02-07 DIAGNOSIS — R079 Chest pain, unspecified: Secondary | ICD-10-CM | POA: Diagnosis not present

## 2022-02-07 DIAGNOSIS — R0789 Other chest pain: Secondary | ICD-10-CM | POA: Diagnosis not present

## 2022-02-07 DIAGNOSIS — J449 Chronic obstructive pulmonary disease, unspecified: Secondary | ICD-10-CM | POA: Diagnosis not present

## 2022-02-07 DIAGNOSIS — R000 Tachycardia, unspecified: Secondary | ICD-10-CM | POA: Diagnosis not present

## 2022-02-07 DIAGNOSIS — I10 Essential (primary) hypertension: Secondary | ICD-10-CM | POA: Diagnosis not present

## 2022-02-07 DIAGNOSIS — Z7901 Long term (current) use of anticoagulants: Secondary | ICD-10-CM | POA: Diagnosis not present

## 2022-02-07 LAB — TROPONIN I (HIGH SENSITIVITY): Troponin I (High Sensitivity): 4 ng/L (ref <18)

## 2022-02-07 LAB — COMPREHENSIVE METABOLIC PANEL
ALT: 19 U/L (ref 0–44)
AST: 22 U/L (ref 15–41)
Albumin: 3.9 g/dL (ref 3.5–5.0)
Alkaline Phosphatase: 51 U/L (ref 38–126)
Anion gap: 9 (ref 5–15)
BUN: 18 mg/dL (ref 8–23)
CO2: 24 mmol/L (ref 22–32)
Calcium: 9 mg/dL (ref 8.9–10.3)
Chloride: 105 mmol/L (ref 98–111)
Creatinine, Ser: 1.31 mg/dL — ABNORMAL HIGH (ref 0.61–1.24)
GFR, Estimated: 53 mL/min — ABNORMAL LOW (ref >60)
Glucose, Bld: 132 mg/dL — ABNORMAL HIGH (ref 70–99)
Potassium: 3.8 mmol/L (ref 3.5–5.1)
Sodium: 138 mmol/L (ref 135–145)
Total Bilirubin: 0.3 mg/dL (ref 0.3–1.2)
Total Protein: 6.6 g/dL (ref 6.5–8.1)

## 2022-02-07 LAB — CBC
HCT: 41.3 % (ref 39.0–52.0)
Hemoglobin: 13.9 g/dL (ref 13.0–17.0)
MCH: 31.2 pg (ref 26.0–34.0)
MCHC: 33.7 g/dL (ref 30.0–36.0)
MCV: 92.6 fL (ref 80.0–100.0)
Platelets: 169 10*3/uL (ref 150–400)
RBC: 4.46 MIL/uL (ref 4.22–5.81)
RDW: 12.2 % (ref 11.5–15.5)
WBC: 7.6 10*3/uL (ref 4.0–10.5)
nRBC: 0 % (ref 0.0–0.2)

## 2022-02-07 NOTE — ED Notes (Signed)
ED Provider at bedside. 

## 2022-02-07 NOTE — ED Provider Notes (Signed)
? ?[facility]  ?Provider Note ? ?CSN: [id number] ?Arrival date & time: 02/07/22 2148 ? ?History ?Chief Complaint  ?Patient presents with  ? Chest Pain  ? ? ?David Bush is a 88 y.o. male who presents today with chest pain.  Pain has been present intermittently for the last 3 days.  His longest episode was yesterday lasting approximate 4 hours.  It resolved spontaneously.  He had return of symptoms tonight.  He was given nitroglycerin and aspirin with minimal change in his symptoms.  He is currently pain-free.  He denies any recent illnesses, recent travel.  No leg pain or leg swelling.  No shortness of breath, cough, fever, congestion.  No history of blood clots.  He does have a history of atrial fibrillation. ? ? ?Home Medications ?Prior to Admission medications   ?Medication Sig Start Date End Date Taking? Authorizing Provider  ?amLODipine (NORVASC) 10 MG tablet Take 10 mg by mouth daily. 11/05/21   [provider]  ?benazepril (LOTENSIN) 40 MG tablet TAKE 1 TABLET DAILY 12/03/21   [name], MD  ?ELIQUIS 2.5 MG TABS tablet TAKE 1 TABLET TWICE A DAY 09/30/21   Camnitz, Ocie Doyne, MD  ?finasteride (PROSCAR) 5 MG tablet TAKE 1 TABLET DAILY (NEED OFFICE VISIT) 07/27/21   [name], MD  ?flecainide (TAMBOCOR) 100 MG tablet TAKE 1 TABLET TWICE A DAY 03/10/21   Camnitz, Ocie Doyne, MD  ?metoprolol succinate (TOPROL-XL) 50 MG 24 hr tablet Take 50 mg by mouth daily. 11/05/21   [provider]  ?Multiple Vitamins-Minerals (CENTRUM SILVER PO) Take 1 tablet by mouth daily.    [provider]  ?niacinamide 500 MG tablet Take 500 mg by mouth 2 (two) times daily with a meal.    [provider]  ?nitroGLYCERIN (NITROSTAT) 0.4 MG SL tablet Place 1 tablet (0.4 mg total) under the tongue every 5 (five) minutes as needed for chest pain. 01/15/19   Leanor Kail, PA  ?pantoprazole (PROTONIX) 40 MG tablet TAKE 1 TABLET DAILY 01/25/22    [name], MD  ?rosuvastatin (CRESTOR) 20 MG tablet TAKE 1 TABLET DAILY 08/05/21   [name], MD  ?vitamin B-12 (CYANOCOBALAMIN) 1000 MCG tablet Take 1,000 mcg by mouth daily.    [provider]  ? ? ? ?Allergies    ?Diltiazem ? ? ?Review of Systems   ?Review of Systems  ?Constitutional:  Negative for chills and fever.  ?HENT:  Negative for ear pain and sore throat.   ?Eyes:  Negative for pain and visual disturbance.  ?Respiratory:  Negative for cough and shortness of breath.   ?Cardiovascular:  Positive for chest pain. Negative for palpitations.  ?Gastrointestinal:  Negative for abdominal pain and vomiting.  ?Genitourinary:  Negative for dysuria and hematuria.  ?Musculoskeletal:  Negative for arthralgias and back pain.  ?Skin:  Negative for color change and rash.  ?Neurological:  Negative for seizures and syncope.  ?All other systems reviewed and are negative. ?Please see HPI for pertinent positives and negatives ? ?Physical Exam ?BP (!) 152/76   Pulse 72   Temp 98.1 ?F (36.7 ?C) (Oral)   Resp 16   Ht 6' (1.829 m)   Wt 90.7 kg   SpO2 97%   BMI 27.12 kg/m?  ? ?Physical Exam ?Vitals and nursing note reviewed.  ?Constitutional:   ?   General: He is not in acute distress. ?   Appearance: He is well-developed.  ?HENT:  ?  Head: Normocephalic and atraumatic.  ?Eyes:  ?   Conjunctiva/sclera: Conjunctivae normal.  ?Cardiovascular:  ?   Rate and Rhythm: Normal rate. Rhythm irregular.  ?   Heart sounds: No murmur heard. ?Pulmonary:  ?   Effort: Pulmonary effort is normal. No respiratory distress.  ?   Breath sounds: Normal breath sounds.  ?Abdominal:  ?   Palpations: Abdomen is soft.  ?   Tenderness: There is no abdominal tenderness.  ?Musculoskeletal:     ?   General: No swelling.  ?   Cervical back: Neck supple.  ?Skin: ?   General: Skin is warm and dry.  ?   Capillary Refill: Capillary refill takes less than 2 seconds.  ?Neurological:  ?   Mental Status: He is alert.  ?Psychiatric:     ?    Mood and Affect: Mood normal.  ? ? ?ED Results / Procedures / Treatments   ?EKG ?EKG Interpretation ? ?Date/Time:  Sunday February 07 2022 21:54:42 EST ?Ventricular Rate:  73 ?PR Interval:    ?QRS Duration: 134 ?QT Interval:  405 ?QTC Calculation: 450 ?R Axis:   -38 ?Text Interpretation: Atrial fibrillation Nonspecific IVCD with LAD Artifact in lead(s) I II III aVR aVL aVF V1 V2 background noise TECHNICALLY DIFFICULT Confirmed by Deno Etienne [id number]) on 02/07/2022 10:32:02 PM ? ?Procedures ?Procedures ? ?Medications Ordered in the ED ?Medications - No data to display ? ? ?ED Course  ? ?  ? ? ?MDM  ? ?This patient presents to the ED for concern of chest pain, this involves an extensive number of treatment options, and is a complaint that carries with it a high risk of complications and morbidity.  The differential diagnosis includes ACS, atypical chest pain, musculoskeletal chest pain. Patient?s presentation is complicated by their history of A-fib on Eliquis ? ?Additional history obtained: ?Records reviewed Care Everywhere/External Records and Primary Care Documents ? ?Lab Tests: ?I Ordered, and personally interpreted labs.  The pertinent results include: CBC was unremarkable.  Troponin was 4.  CMP without any gross electrolyte abnormalities.  And was 1.31 which is near baseline. ? ?Imaging Studies ordered: ?I ordered imaging studies including X-ray chest   ?I independently visualized and interpreted imaging which showed no acute abnormalities ?I agree with the radiologist interpretation ? ?EKG (personally reviewed and interpreted): No STEMI.  No ischemia.  Difficult EKG given wandering baseline. ? ? ?Medical Decision Making: Patient presented with chest pain.  Pain has been present for multiple days.  He does have a history of high blood pressure and is on Eliquis for A-fib.  He has no personal history of cardiac disease.  His initial troponin was 4.  His EKG did not show any signs of STEMI or acute ischemia.  Labs are  unremarkable.  Pain was well localized, but was not reproducible.  Doubt PE. Shared decision making used regarding disposition planning with patient.  Patient was for to go home and follow-up outpatient with cardiology.  We discussed that if his second troponin was stable and he was still pain-free that this was a reasonable option. WE did discuss the risks and benefits of this, which he voiced understanding of. He would prefer to go home and follow up in outpatient setting. We will provide him with outpatient resources.  Patient taken troponin was pending at the time of signout.  Patient will be discharged home with cardiology follow-up assuming that he remains pain-free and his second troponin is stable. ? ?Complexity of problems addressed: ?Patient?s presentation is  most consistent with  acute presentation with potential threat to life or bodily function ? ?Disposition: ?After consideration of the diagnostic results and the patient?s response to treatment,  ?I feel that the patent would benefit from discharge assuming 2nd troponin stable .  ? ?Patient seen in conjunction with my attending, Dr. Tyrone Nine. ? ? ? ?Final Clinical Impression(s) / ED Diagnoses ?Final diagnoses:  ?Chest pain, unspecified type  ? ? ?Rx / DC Orders ?ED Discharge Orders   ? ? None  ? ?  ? ? ?  ?Jacelyn Pi, MD ?02/07/22 2326 ? ?  ?Deno Etienne, DO ?02/07/22 2330 ? ?

## 2022-02-07 NOTE — ED Triage Notes (Signed)
Pt presents to the ED from home via [facility] with complaints of left and center chest pain onset Thursday that was intermittent. Saturday had pain for about 4-5 hours tonight at 2000, presently no pain after 1 NTG and 324 ASA po. Pt denies any complaints of SOB, cough, congestion, fever. ?

## 2022-02-08 DIAGNOSIS — R079 Chest pain, unspecified: Secondary | ICD-10-CM | POA: Diagnosis not present

## 2022-02-08 LAB — TROPONIN I (HIGH SENSITIVITY): Troponin I (High Sensitivity): 7 ng/L (ref <18)

## 2022-02-08 NOTE — ED Notes (Signed)
ED Provider at bedside. 

## 2022-02-08 NOTE — Discharge Instructions (Signed)
Please follow up with your cardiologist regarding your frequent episodes of chest pain over the past several days ? ?Return to the ED for any new/worsening symptoms ?

## 2022-02-08 NOTE — ED Provider Notes (Cosign Needed)
Care assumed from Resident Dr. Greta Doom working with Dr. Tyrone Nine at shift change, please see their notes for full documentation of patient's complaint/HPI. Briefly, pt here with complaint of intermittent chest pain for the past 4 days with return of symptoms tonight. Results so far show EKG with A fib (hx of same) and first troponin negative at 4. Awaiting repeat troponin testing at this time. Plan is to dispo accordingly however anticipate discharge home. Previous provider had shared decision making with patient and ultimately felt he could follow up with cardiology in the outpatient setting if repeat troponin unremarkable .   Physical Exam  BP (!) 152/80    Pulse 64    Temp 98.1 F (36.7 C) (Oral)    Resp 15    Ht 6' (1.829 m)    Wt 90.7 kg    SpO2 97%    BMI 27.12 kg/m   Physical Exam Vitals and nursing note reviewed.  Constitutional:      Appearance: He is not ill-appearing.  HENT:     Head: Normocephalic and atraumatic.  Eyes:     Conjunctiva/sclera: Conjunctivae normal.  Cardiovascular:     Rate and Rhythm: Normal rate and regular rhythm.  Pulmonary:     Effort: Pulmonary effort is normal.     Breath sounds: Normal breath sounds.  Skin:    General: Skin is warm and dry.     Coloration: Skin is not jaundiced.  Neurological:     Mental Status: He is alert.    Procedures  Procedures  ED Course / MDM    Medical Decision Making On reevaluation pt continues to be chest pain free. Repeat troponin flat. Stable for discharge at this time.   Problems Addressed: Chest pain, unspecified type: acute illness or injury  Amount and/or Complexity of Data Reviewed Labs: ordered.    Details: Repeat troponin of 7 Radiology: ordered.         Eustaquio Maize, PA-C 02/08/22 [phone]

## 2022-02-19 ENCOUNTER — Ambulatory Visit: Payer: Medicare Other | Admitting: Cardiology

## 2022-02-22 ENCOUNTER — Encounter: Payer: Self-pay | Admitting: Physician Assistant

## 2022-02-22 ENCOUNTER — Ambulatory Visit (INDEPENDENT_AMBULATORY_CARE_PROVIDER_SITE_OTHER): Payer: Medicare Other | Admitting: Physician Assistant

## 2022-02-22 VITALS — BP 132/84 | HR 61 | Ht 72.0 in | Wt 200.6 lb

## 2022-02-22 DIAGNOSIS — R072 Precordial pain: Secondary | ICD-10-CM | POA: Diagnosis not present

## 2022-02-22 DIAGNOSIS — R079 Chest pain, unspecified: Secondary | ICD-10-CM | POA: Diagnosis not present

## 2022-02-22 DIAGNOSIS — Z8719 Personal history of other diseases of the digestive system: Secondary | ICD-10-CM

## 2022-02-22 NOTE — Progress Notes (Signed)
? ?Subjective:  ? ? Patient ID: David Bush, male    DOB: 1934-07-28, 88 y.o.   MRN: [id number] ? ?HPI ?David "David Bush" is a pleasant 88 year old white male, established with Dr. Loletha Carrow.  He has history of long segment Barrett's esophagus, hypertension, atrial fibrillation for which he is on Eliquis, and hyperlipidemia. ?He comes in today after recent ER visit on 02/07/2022 with chest pain, for further evaluation. ?He underwent work-up with EKG, chest x-ray and troponins all of which were unremarkable with the exception of atrial fibrillation on EKG.  He was asked to follow-up with cardiology.  He called for appointment but cannot be seen until 03/08/2022. ?Patient says that he was having some intermittent mild chest pain noted at rest for a couple of days prior to March 5, then in the evening previous while he was sitting in his chair watching TV he developed substernal chest pain which she describes as a dull throbbing sensation which lasted for a total of about 5 hours and then stopped.  He took nitroglycerin which did not help.  He did not have any associated shortness of breath, lightheadedness or diaphoresis.  The following day he was able to be up and around without any symptoms and then again when he was watching TV he developed chest pain again very similar and his wife called 911. ?Interestingly he has not had any recurrence of pain since that time.  He denies any nausea or abdominal discomfort.  Appetite has been fine, no heartburn or indigestion, no dysphagia or odynophagia.  He does stay on Protonix 40 mg daily. ?He says he has had an increase in fatigue lately with activity. ? ?Labs from 02/07/2022-CBC within normal limits, troponins negative x2, creatinine 1.31, LFTs within normal limits. ? ?Last EGD June 2020 with 4 cm segment of Barrett's esophagus in the lower esophagus.  Biopsies showed Barrett's changes without dysplasia.  No further surveillance indicated due to age. ? ?Patient mentions a second  problem today.  He says he is always very regular but has been constipated over the past 4 to 5 days.  He is able to have small bowel movements but says he feels like there is more stool that he is unable to pass.  No abdominal pain, no changes in meds.  No melena or hematochezia.  He tried taking Caro syrup and prune juice without any results. ? ?Review of Systems.Pertinent positive and negative review of systems were noted in the above HPI section.  All other review of systems was otherwise negative.  ? ?Outpatient Encounter Medications as of 02/22/2022  ?Medication Sig  ? acetaminophen (TYLENOL) 325 MG tablet Take 650 mg by mouth every 6 (six) hours as needed for moderate pain or headache.  ? amLODipine (NORVASC) 10 MG tablet Take 5 mg by mouth daily.  ? benazepril (LOTENSIN) 40 MG tablet TAKE 1 TABLET DAILY (Patient taking differently: Take 40 mg by mouth daily.)  ? ELIQUIS 2.5 MG TABS tablet TAKE 1 TABLET TWICE A DAY (Patient taking differently: Take 2.5 mg by mouth 2 (two) times daily.)  ? finasteride (PROSCAR) 5 MG tablet TAKE 1 TABLET DAILY (NEED OFFICE VISIT) (Patient taking differently: Take 5 mg by mouth at bedtime.)  ? flecainide (TAMBOCOR) 100 MG tablet TAKE 1 TABLET TWICE A DAY (Patient taking differently: Take 100 mg by mouth 2 (two) times daily.)  ? metoprolol succinate (TOPROL-XL) 50 MG 24 hr tablet Take 25 mg by mouth daily.  ? Multiple Vitamins-Minerals (CENTRUM SILVER PO) Take 1  tablet by mouth daily.  ? nitroGLYCERIN (NITROSTAT) 0.4 MG SL tablet Place 1 tablet (0.4 mg total) under the tongue every 5 (five) minutes as needed for chest pain.  ? pantoprazole (PROTONIX) 40 MG tablet TAKE 1 TABLET DAILY (Patient taking differently: Take 40 mg by mouth daily.)  ? rosuvastatin (CRESTOR) 20 MG tablet TAKE 1 TABLET DAILY (Patient taking differently: Take 20 mg by mouth at bedtime.)  ? [DISCONTINUED] vitamin B-12 (CYANOCOBALAMIN) 1000 MCG tablet Take 1,000 mcg by mouth daily. (Patient not taking: Reported  on 02/22/2022)  ? ?No facility-administered encounter medications on file as of 02/22/2022.  ? ?Allergies  ?Allergen Reactions  ? Diltiazem Itching and Rash  ? ?Patient Active Problem List  ? Diagnosis Date Noted  ? Barrett's esophagus 09/21/2016  ? HTN (hypertension) 09/21/2016  ? HLD (hyperlipidemia) 09/21/2016  ? Cataracts, both eyes 09/21/2016  ? ?Social History  ? ?Socioeconomic History  ? Marital status: Married  ?  Spouse name: Not on file  ? Number of children: 1  ? Years of education: Not on file  ? Highest education level: Not on file  ?Occupational History  ? Not on file  ?Tobacco Use  ? Smoking status: Former  ?  Years: 16.00  ?  Types: Cigarettes  ?  Quit date: 12/06/1966  ?  Years since quitting: 55.2  ? Smokeless tobacco: Never  ?Vaping Use  ? Vaping Use: Never used  ?Substance and Sexual Activity  ? Alcohol use: No  ?  Alcohol/week: 0.0 standard drinks  ?  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 Drug use: No  ? Sexual activity: Not on file  ?Other Topics Concern  ? Not on file  ?Social History Narrative  ? Not on file  ? ?Social Determinants of Health  ? ?Financial Resource Strain: Not on file  ?Food Insecurity: Not on file  ?Transportation Needs: Not on file  ?Physical Activity: Not on file  ?Stress: Not on file  ?Social Connections: Not on file  ?Intimate Partner Violence: Not on file  ? ? ?David Bush's family history includes Esophageal cancer in his mother; Heart disease in his brother; Kidney cancer in his sister; Kidney disease in his brother; Other in his brother; Stroke in his father. ? ? ?   ?Objective:  ?  ?Vitals:  ? 02/22/22 1446  ?BP: 132/84  ?Pulse: 61  ?SpO2: 98%  ? ? ?Physical Exam Well-developed well-nourished  elderly WM  in no acute distress.  Height, Weight,200  BMI 27 ? ?HEENT; nontraumatic normocephalic, EOMI, PE R LA, sclera anicteric. ?Oropharynx; not examined today ?Neck; supple, no JVD ?Cardiovascular; regular rate and rhythm with Q2-I2, systolic murmur  present ?Pulmonary; Clear bilaterally, no chest wall tenderness or costal margin tenderness ?Abdomen; soft, nontender, nondistended, no palpable mass or hepatosplenomegaly, bowel sounds are active ?Rectal; not done today ?Skin; benign exam, no jaundice rash or appreciable lesions ?Extremities; no clubbing cyanosis or edema skin warm and dry ?Neuro/Psych; alert and oriented x4, grossly nonfocal mood and affect appropriate  ? ? ? ?   ?Assessment & Plan:  ? ?#24 88 year old white male with history of Barrett's esophagus, no dysplasia, who comes in today after recent ER visit on 02/07/2022 with chest pain which had occurred on 2 separate days and was prolonged. ?Initial work-up in the ER negative for acute coronary syndrome, chest x-ray unremarkable ?Cardiology appointment pending ?No recurrence of pain ? ?I do not think his pain was of GI etiology, i.e. secondary to  Barrett's or esophagitis by his history.  Consider biliary colic which can sometimes present with subxiphoid pain. ? ?#2 atrial fibrillation ?#3  Chronic anticoagulation-on Eliquis ?#4.  Hyperlipidemia ?#5.  Hypertension ? ?#6 new constipation x4 to 5 days ? ?Plan; Continue Protonix 40 mg p.o. every morning ?Schedule for upper abdominal ultrasound ?We have messaged cardiology to try to get his appointment moved to a sooner date, needs further cardiac work-up ? ?For constipation have asked him to try a Dulcolax suppository followed by MiraLAX 17 g in 8 ounces of water twice daily over the next couple of days until he is having good bowel movements then can stop MiraLAX ?If he does not have good results with above he has been advised to call back and speak to my nurse for further advice. ? ? ?Andreanna Mikolajczak S Demetrice Combes PA-C ?02/22/2022 ? ? ?Cc: [name], MD ?  ?

## 2022-02-22 NOTE — Patient Instructions (Signed)
If you are age 86 or older, your body mass index should be between 23-30. Your Body mass index is 27.21 kg/m?Marland Kitchen If this is out of the aforementioned range listed, please consider follow up with your Primary Care Provider. ?________________________________________________________ ? ?The [facility] providers would like to encourage you to use [facility] to communicate with providers for non-urgent requests or questions.  Due to long hold times on the telephone, sending your provider a message by [facility] may be a faster and more efficient way to get a response.  Please allow 48 business hours for a response.  Please remember that this is for non-urgent requests.  ?_______________________________________________________ ? ?You have been scheduled for an abdominal ultrasound at [facility] (1st floor of hospital) on 03/02/2022 at 9:00 am. Please arrive 15 minutes prior to your appointment for registration. Make certain not to have anything to eat or drink 6 hours prior to your appointment. Should you need to reschedule your appointment, please contact radiology at [phone]. This test typically takes about 30 minutes to perform. ? ?Get Dulcolax suppository, use 1 then start Miralax 1 capful in 8 ounces of water/juice twice a day until you start having good bowel movements then stop. ? ?Increase water intake to 60 ounces daily ? ?Call and speak with Amy's nurse, Beth if constipation continues.  ? ?A message has been sent to Dr. Macky Lower nurse to try and get you a sooner visit with Cardiology. ? ?Thank you for entrusting me with your care and choosing [facility]. ? ?Nicoletta Ba, PA-C ?

## 2022-02-23 NOTE — Progress Notes (Signed)
____________________________________________________________ ? ?Attending physician addendum: ? ?Thank you for sending this case to me. ?I have reviewed the entire note and agree with the plan. ? ?Agree it is not clear if this chest pain was digestive or cardiac in nature.  It would not be from Barrett's esophagus,, and I agree with an ultrasound to look for gallstones.  If gallstones are found, he certainly still needs a cardiology evaluation and then I would wait to see if further episodes occur before considering surgical referral. ? ?Wilfrid Lund, MD ? ?____________________________________________________________ ? ?

## 2022-03-02 ENCOUNTER — Other Ambulatory Visit: Payer: Self-pay

## 2022-03-02 ENCOUNTER — Ambulatory Visit (HOSPITAL_COMMUNITY)
Admission: RE | Admit: 2022-03-02 | Discharge: 2022-03-02 | Disposition: A | Discharge status: Home / Self Care | Payer: Medicare Other | Source: Ambulatory Visit | Attending: Physician Assistant | Admitting: Physician Assistant

## 2022-03-02 DIAGNOSIS — R079 Chest pain, unspecified: Secondary | ICD-10-CM | POA: Diagnosis not present

## 2022-03-02 DIAGNOSIS — R072 Precordial pain: Secondary | ICD-10-CM | POA: Insufficient documentation

## 2022-03-02 DIAGNOSIS — R1013 Epigastric pain: Secondary | ICD-10-CM | POA: Diagnosis not present

## 2022-03-02 DIAGNOSIS — Z8719 Personal history of other diseases of the digestive system: Secondary | ICD-10-CM | POA: Diagnosis not present

## 2022-03-03 ENCOUNTER — Telehealth: Payer: Self-pay | Admitting: *Deleted

## 2022-03-03 ENCOUNTER — Other Ambulatory Visit: Payer: Self-pay | Admitting: Cardiology

## 2022-03-03 DIAGNOSIS — I480 Paroxysmal atrial fibrillation: Secondary | ICD-10-CM

## 2022-03-03 NOTE — Telephone Encounter (Signed)
-----   Message from Aleatha Borer, [place] sent at 02/22/2022  3:39 PM EDT ----- ?Roena Malady, ? ?Amy Esterwood just seen this patient in the office. He was seen in the ER earlier this month and was scheduled to see Dr. Curt Bears on April 3. She is concerned that he may need to be seen sooner for possible stress testing. ? ?Thanks, ?Cherylann Parr ? ?

## 2022-03-03 NOTE — Telephone Encounter (Signed)
Followed up with pt. ?Pt not at home, spoke with wife (dpr on file) ?Explained that I was out of the office most of last week, off yesterday and just seeing this message about pt's CP. ?She reports he has not had reoccurrence of CP and agreeable to have him calling back if reoccurs and/or go to ED. ?Agreeable to keeping appt scheduled for Monday.  ?

## 2022-03-05 ENCOUNTER — Telehealth: Payer: Self-pay | Admitting: Physician Assistant

## 2022-03-05 NOTE — Telephone Encounter (Signed)
Spoke with the patient. He reports he is unable to pass stool today. This week he has had worsening constipation all week. States "I get about 3 little urges each day. I pass tiny bit of stool on about the 3rd try each time. Today I don't even have an urge." ?Asked about Miralax. He states he "used it all up and it didn't do a bit of good. So I did not buy anymore." The suppository (Dulcolax) did not produce results. States "it just burned." He took a saline enema last Saturday. He had water return with "only a little bit of stool." He asks what he should do and if he could try "one of those preps for a colonoscopy." ?Denies nausea or abdominal pain. Appetite is "very good." Not taking any other supplements or fiber. ?

## 2022-03-05 NOTE — Telephone Encounter (Signed)
Patient called was seen 02/22/22. Patient states he's constipated, has the urge to go but cannot. Patient tried enema with no results. Patient would like to know if there's anything else he can do for constipation. Please advise.  ?

## 2022-03-05 NOTE — Telephone Encounter (Signed)
Patient will come pick up a sample kit of Clenpiq. Drink one bottle today. Eat lightly. Drink plenty of fluids and water. Sample at front desk 3rd floor. Instructions inside box. ?

## 2022-03-08 ENCOUNTER — Ambulatory Visit (INDEPENDENT_AMBULATORY_CARE_PROVIDER_SITE_OTHER): Payer: Medicare Other | Admitting: Cardiology

## 2022-03-08 ENCOUNTER — Encounter: Payer: Self-pay | Admitting: *Deleted

## 2022-03-08 ENCOUNTER — Encounter: Payer: Self-pay | Admitting: Cardiology

## 2022-03-08 VITALS — BP 128/72 | HR 60 | Ht 71.0 in | Wt 203.0 lb

## 2022-03-08 DIAGNOSIS — R072 Precordial pain: Secondary | ICD-10-CM

## 2022-03-08 DIAGNOSIS — R079 Chest pain, unspecified: Secondary | ICD-10-CM

## 2022-03-08 DIAGNOSIS — Z01812 Encounter for preprocedural laboratory examination: Secondary | ICD-10-CM | POA: Diagnosis not present

## 2022-03-08 DIAGNOSIS — D6869 Other thrombophilia: Secondary | ICD-10-CM

## 2022-03-08 DIAGNOSIS — I480 Paroxysmal atrial fibrillation: Secondary | ICD-10-CM | POA: Diagnosis not present

## 2022-03-08 LAB — BASIC METABOLIC PANEL
BUN/Creatinine Ratio: 11 (ref 10–24)
BUN: 14 mg/dL (ref 8–27)
CO2: 25 mmol/L (ref 20–29)
Calcium: 9.8 mg/dL (ref 8.6–10.2)
Chloride: 100 mmol/L (ref 96–106)
Creatinine, Ser: 1.25 mg/dL (ref 0.76–1.27)
Glucose: 111 mg/dL — ABNORMAL HIGH (ref 70–99)
Potassium: 4.4 mmol/L (ref 3.5–5.2)
Sodium: 139 mmol/L (ref 134–144)
eGFR: 56 mL/min/{1.73_m2} — ABNORMAL LOW (ref >59)

## 2022-03-08 MED ORDER — METOPROLOL SUCCINATE ER 25 MG PO TB24: 25.0000 mg | ORAL_TABLET | Freq: Every day | ORAL | 3 refills | Status: DC

## 2022-03-08 NOTE — Patient Instructions (Addendum)
Medication Instructions:  ?Your physician recommends that you continue on your current medications as directed. Please refer to the Current Medication list given to you today. ? ?*If you need a refill on your cardiac medications before your next appointment, please call your pharmacy* ? ? ?Lab Work: ?Today: BMET ?If you have labs (blood work) drawn today and your tests are completely normal, you will receive your results only by: ?MyChart Message (if you have MyChart) OR ?A paper copy in the mail ?If you have any lab test that is abnormal or we need to change your treatment, we will call you to review the results. ? ? ?Testing/Procedures: ?Your physician has requested that you have cardiac CT. Cardiac computed tomography (CT) is a painless test that uses an x-ray machine to take clear, detailed pictures of your heart. For further information please visit [facility]. Please follow instruction sheet as given. ? ? ?Follow-Up: ?At [facility], you and your health needs are our priority.  As part of our continuing mission to provide you with exceptional heart care, we have created designated Provider Care Teams.  These Care Teams include your primary Cardiologist (physician) and Advanced Practice Providers (APPs -  Physician Assistants and Nurse Practitioners) who all work together to provide you with the care you need, when you need it. ? ?Your next appointment:   ?6 month(s) ? ?The format for your next appointment:   ?In Person ? ?Provider:   ?You will see one of the following Advanced Practice Providers on your designated Care Team:   ?Tommye Standard, PA-C ?Legrand Como "Jonni Sanger" [place], PA-C ? ? ? ?Thank you for choosing [facility]!! ? ? ?Trinidad Curet, RN ?([phone] ? ? ?Other Instructions ?

## 2022-03-08 NOTE — Progress Notes (Signed)
? ?Electrophysiology Office Note ? ? ?Date:  03/08/2022  ? ?ID:  Beverlyn Roux, DOB 1934-04-06, MRN [id number] ? ?PCP:  [name], MD  ?Cardiologist:   ?Primary Electrophysiologist:  Sebastyan Snodgrass Meredith Leeds, MD   ? ?Chief Complaint  ?Patient presents with  ? Follow-up  ? ? ?  ?History of Present Illness: ?ZHAMIR PIRRO is a 88 y.o. male who is being seen today for the evaluation of bradycardia, palpitations at the request of [name], MD. Presenting today for electrophysiology evaluation.   ? ?He has a history significant for Barrett's esophagus, CKD stage III, hypertension, hyperlipidemia.  He has atrial fibrillation.  He is currently on flecainide. ? ?Today, denies symptoms of palpitations, chest pain, shortness of breath, orthopnea, PND, lower extremity edema, claudication, dizziness, presyncope, syncope, bleeding, or neurologic sequela. The patient is tolerating medications without difficulties.  Since being seen he has done well well without episodes of atrial fibrillation.  Approximately 1 month ago, he did have an episode of chest pain.  Chest pain was stuttering.  It occurred at rest.  He presented to the emergency room and had 2 negative troponins without an upward trend.  He has not had any further chest pain.  He did see gastroenterology who did not feel that it was an esophageal issue.  He did take nitroglycerin without change in his symptoms. ? ?Past Medical History:  ?Diagnosis Date  ? Atrial fibrillation ([place])   ? Barrett's esophagus   ? Cataract   ? surgery to remove bilateral  ? Chest pain   ? in feb 2020/ saw cardiologist- no heart attack!  ? CKD (chronic kidney disease) stage 3, GFR 30-59 ml/min (HCC)   ? GERD (gastroesophageal reflux disease)   ? Hx of hiatal hernia   ? Hyperlipidemia   ? Hypertension   ? Skin cancer   ? squamous cell of face/arm/legs  ? Substance abuse ([place])   ? stopped drinking in 1993  ? ?Past Surgical History:  ?Procedure Laterality Date  ? CATARACT  EXTRACTION, BILATERAL  2012  ? COLONOSCOPY    ? KNEE ARTHROSCOPY Left 2010  ? torn meniscus  ? SKIN CANCER EXCISION  2015, 2012  ? face, arms and legs  ? UPPER GASTROINTESTINAL ENDOSCOPY    ? barrett's esophagus  ? [place] EXTRACTION  1980  ? WISDOM TOOTH EXTRACTION    ? ? ? ?Current Outpatient Medications  ?Medication Sig Dispense Refill  ? acetaminophen (TYLENOL) 325 MG tablet Take 650 mg by mouth every 6 (six) hours as needed for moderate pain or headache.    ? amLODipine (NORVASC) 10 MG tablet Take 5 mg by mouth daily.    ? benazepril (LOTENSIN) 40 MG tablet TAKE 1 TABLET DAILY 90 tablet 3  ? ELIQUIS 2.5 MG TABS tablet TAKE 1 TABLET TWICE A DAY 180 tablet 3  ? finasteride (PROSCAR) 5 MG tablet TAKE 1 TABLET DAILY (NEED OFFICE VISIT) 90 tablet 3  ? flecainide (TAMBOCOR) 100 MG tablet TAKE 1 TABLET TWICE A DAY 180 tablet 1  ? metoprolol succinate (TOPROL XL) 25 MG 24 hr tablet Take 1 tablet (25 mg total) by mouth daily. 90 tablet 3  ? Multiple Vitamins-Minerals (CENTRUM SILVER PO) Take 1 tablet by mouth daily.    ? nitroGLYCERIN (NITROSTAT) 0.4 MG SL tablet Place 1 tablet (0.4 mg total) under the tongue every 5 (five) minutes as needed for chest pain. 25 tablet 3  ? pantoprazole (PROTONIX) 40 MG tablet TAKE 1 TABLET  DAILY 90 tablet 3  ? rosuvastatin (CRESTOR) 20 MG tablet TAKE 1 TABLET DAILY 90 tablet 3  ? ?No current facility-administered medications for this visit.  ? ? ?Allergies:   Diltiazem  ? ?Social History:  The patient  reports that he quit smoking about 55 years ago. His smoking use included cigarettes. He has never used smokeless tobacco. He reports that he does not drink alcohol and does not use drugs.  ? ?Family History:  The patient's family history includes Esophageal cancer in his mother; Heart disease in his brother; Kidney cancer in his sister; Kidney disease in his brother; Other in his brother; Stroke in his father.  ? ?ROS:  Please see the history of present illness.   Otherwise, review  of systems is positive for none.   All other systems are reviewed and negative.  ? ?PHYSICAL EXAM: ?VS:  BP 128/72   Pulse 60   Ht '5\' 11"'$  (1.803 m)   Wt 203 lb (92.1 kg)   SpO2 98%   BMI 28.31 kg/m?  , BMI Body mass index is 28.31 kg/m?. ?GEN: Well nourished, well developed, in no acute distress  ?HEENT: normal  ?Neck: no JVD, carotid bruits, or masses ?Cardiac: RRR; no murmurs, rubs, or gallops,no edema  ?Respiratory:  clear to auscultation bilaterally, normal work of breathing ?GI: soft, nontender, nondistended, + BS ?MS: no deformity or atrophy  ?Skin: warm and dry ?Neuro:  Strength and sensation are intact ?Psych: euthymic mood, full affect ? ?EKG:  EKG is ordered today. ?Personal review of the ekg ordered  shows sinus rhythm, rate 57 ? ?Recent Labs: ?02/07/2022: ALT 19; BUN 18; Creatinine, Ser 1.31; Hemoglobin 13.9; Platelets 169; Potassium 3.8; Sodium 138  ? ? ?Lipid Panel  ?   ?Component Value Date/Time  ? CHOL 110 09/30/2021 0827  ? TRIG 116 09/30/2021 0827  ? HDL 40 09/30/2021 0827  ? CHOLHDL 2.8 09/30/2021 0827  ? VLDL 42 (H) 08/20/2016 1105  ? [place] 50 09/30/2021 0827  ? ? ? ?Wt Readings from Last 3 Encounters:  ?03/08/22 203 lb (92.1 kg)  ?02/22/22 200 lb 9.6 oz (91 kg)  ?02/07/22 200 lb (90.7 kg)  ?  ? ? ?Other studies Reviewed: ?Additional studies/ records that were reviewed today include: Epic notes  ? ?Holter monitor 01/05/18-personally reviewed ?Minimum heart rate 41 bpm at 2:25 AM ?Heart rate 144 bpm 6:05 PM ?Average heart rate 54 bpm ?Sinus rhythm with APCs and PVCs ?Wide-complex tachycardia, irregular, likely atrial fibrillation ? ?ASSESSMENT AND PLAN: ? ?1.  Paroxysmal atrial fibrillation: Currently on flecainide 100 mg twice daily, carvedilol 6.25 mg twice daily, Eliquis 2.5 mg daily.  Medication monitoring for Eliquis via ECG.  QRS complex is not narrow and he does not have first-degree AV block.  CHA2DS2-VASc of at least 3. ? ?2.  Hypertension: Currently well controlled ? ?3.   Hyperlipidemia: Continue Crestor 20 mg per primary physician. ? ?4.  Chest pain: Unclear as to the cause.  He did have a Myoview in 2019 which was low risk.  Despite that, he did have significant chest pain requiring emergency room visit.  Troponins were negative.  We Yanel Dombrosky plan for coronary CT. ? ?5.  Secondary hypercoagulable state: Currently on Eliquis for atrial fibrillation as above. ? ?Current medicines are reviewed at length with the patient today.   ?The patient does not have concerns regarding his medicines.  The following changes were made today: None ? ?Labs/ tests ordered today include:  ?Orders Placed This Encounter  ?  Procedures  ? CT CORONARY MORPH W/CTA COR W/SCORE W/CA W/CM &/OR WO/CM  ? Basic metabolic panel  ? EKG 12-Lead  ? ? ? ? ?Disposition:   FU with Casin Federici 6 months ? ?Signed, ?Delanee Xin Meredith Leeds, MD  ?03/08/2022 10:32 AM    ? ?[facility] ?[address] ?[address] ?[place] [address] ?[phone] (office) ?[phone] (fax)  ?

## 2022-03-10 ENCOUNTER — Other Ambulatory Visit: Payer: Self-pay

## 2022-03-10 DIAGNOSIS — K5900 Constipation, unspecified: Secondary | ICD-10-CM

## 2022-03-10 NOTE — Telephone Encounter (Addendum)
Inbound call from patient reports he did the sample of clenpiq Sunday 4/2 and it cleaned him out. But now he is feeling like he was before unable to have a bowel. States have not had a stool since it cleaned him out.  Best contact number [phone] ?

## 2022-03-10 NOTE — Telephone Encounter (Addendum)
?  He called 03/05/22 with complaints on continued constipation. At that time we gave him a sample box of Clenpiq to purge with.  ?Today the patient states he had good results with the purge 5 days ago. He has not passed any stool since that day. He has an urge for a bowel movement but cannot produce stool. He will find a brown liquid when he cleans. He passes flatus. He is concerned and asks if he could have a blockage. He denies abdominal pain, fever or bloody discharge from rectum. His diet is regular. ?Please advise. ?

## 2022-03-10 NOTE — Telephone Encounter (Signed)
Called the patient to discuss. Spoke with spouse. Patient out of the house presently. She will have him return my call. ?Order for abdominal xray here entered. He can come at his convenience from 9am to 12pm or 1pm to 4pm. ?  ?

## 2022-03-10 NOTE — Telephone Encounter (Signed)
Routed to Dr Rush Landmark in error.  ?Message should go to [employer], PA-C ?

## 2022-03-11 NOTE — Telephone Encounter (Signed)
Patient reports he had a good formed bowel movement today. He will continue the Miralax.  ?He is to call back if he does not continue to improve. ?

## 2022-03-11 NOTE — Telephone Encounter (Signed)
Patient returned your call.  Please call back.  Thank you. 

## 2022-03-18 ENCOUNTER — Telehealth (HOSPITAL_COMMUNITY): Payer: Self-pay | Admitting: *Deleted

## 2022-03-18 NOTE — Telephone Encounter (Signed)
Reaching out to patient to offer assistance regarding upcoming cardiac imaging study; pt's wife answered phone verbalizes understanding of appt date/time, parking situation and where to check in, pre-test NPO status and, and verified current allergies; name and call back number provided for further questions should they arise ? ?Gordy Clement RN Navigator Cardiac Imaging ?[facility] ?[phone] office ?[phone] cell ? ?Patient's wife verbalized understanding that the patient will need to arrive at 11am. ?

## 2022-03-19 ENCOUNTER — Ambulatory Visit (HOSPITAL_COMMUNITY)
Admission: RE | Admit: 2022-03-19 | Discharge: 2022-03-19 | Disposition: A | Discharge status: Home / Self Care | Payer: Medicare Other | Source: Ambulatory Visit | Attending: Cardiology | Admitting: Cardiology

## 2022-03-19 DIAGNOSIS — R079 Chest pain, unspecified: Secondary | ICD-10-CM

## 2022-03-19 DIAGNOSIS — R072 Precordial pain: Secondary | ICD-10-CM

## 2022-03-19 MED ORDER — NITROGLYCERIN 0.4 MG SL SUBL
SUBLINGUAL_TABLET | SUBLINGUAL | Status: AC
  Filled 2022-03-19: qty 2

## 2022-03-19 MED ORDER — IOHEXOL 350 MG/ML SOLN
100.0000 mL | Freq: Once | INTRAVENOUS | Status: AC | PRN
  Administered 2022-03-19: 100 mL via INTRAVENOUS

## 2022-03-19 MED ORDER — NITROGLYCERIN 0.4 MG SL SUBL: 0.8000 mg | SUBLINGUAL_TABLET | Freq: Once | SUBLINGUAL | Status: DC

## 2022-03-19 MED ORDER — NITROGLYCERIN 0.4 MG SL SUBL
0.8000 mg | SUBLINGUAL_TABLET | Freq: Once | SUBLINGUAL | Status: AC
Start: 2022-03-19 — End: 2022-03-19
  Administered 2022-03-19: 0.8 mg via SUBLINGUAL

## 2022-03-24 DIAGNOSIS — H01021 Squamous blepharitis right upper eyelid: Secondary | ICD-10-CM | POA: Diagnosis not present

## 2022-03-24 DIAGNOSIS — H43813 Vitreous degeneration, bilateral: Secondary | ICD-10-CM | POA: Diagnosis not present

## 2022-03-24 DIAGNOSIS — H35033 Hypertensive retinopathy, bilateral: Secondary | ICD-10-CM | POA: Diagnosis not present

## 2022-03-24 DIAGNOSIS — H01024 Squamous blepharitis left upper eyelid: Secondary | ICD-10-CM | POA: Diagnosis not present

## 2022-03-30 ENCOUNTER — Telehealth: Payer: Self-pay | Admitting: Physician Assistant

## 2022-03-30 NOTE — Telephone Encounter (Signed)
Inbound call from patient reports constipation. States haven't had a good bowel movement all month. Did have an appt scheduled 4/26 but he cancelled and would like a call back ?

## 2022-03-30 NOTE — Telephone Encounter (Signed)
Returned the call to the patient. Spoke with his spouse. Patient is out of the house "playing golf." ?He is taking daily Miralax and probiotics. He tells his wife that he will have an urge to go to the bathroom to move his bowels. He will pass a little soft stool, clean, then stand up. Then he will have stool "ooze out and make a mess." He has begun putting a pad in his underwear to catch any feces that passes incontinently. No abdominal pain. He has not had any diet changes. He does not take fiber supplements. What are your recommendations? ?

## 2022-03-31 ENCOUNTER — Ambulatory Visit: Payer: Medicare Other | Admitting: Physician Assistant

## 2022-03-31 DIAGNOSIS — C44329 Squamous cell carcinoma of skin of other parts of face: Secondary | ICD-10-CM | POA: Diagnosis not present

## 2022-03-31 DIAGNOSIS — L578 Other skin changes due to chronic exposure to nonionizing radiation: Secondary | ICD-10-CM | POA: Diagnosis not present

## 2022-03-31 DIAGNOSIS — L570 Actinic keratosis: Secondary | ICD-10-CM | POA: Diagnosis not present

## 2022-03-31 DIAGNOSIS — D225 Melanocytic nevi of trunk: Secondary | ICD-10-CM | POA: Diagnosis not present

## 2022-03-31 DIAGNOSIS — C44622 Squamous cell carcinoma of skin of right upper limb, including shoulder: Secondary | ICD-10-CM | POA: Diagnosis not present

## 2022-03-31 DIAGNOSIS — D485 Neoplasm of uncertain behavior of skin: Secondary | ICD-10-CM | POA: Diagnosis not present

## 2022-03-31 DIAGNOSIS — C44729 Squamous cell carcinoma of skin of left lower limb, including hip: Secondary | ICD-10-CM | POA: Diagnosis not present

## 2022-03-31 DIAGNOSIS — L821 Other seborrheic keratosis: Secondary | ICD-10-CM | POA: Diagnosis not present

## 2022-03-31 DIAGNOSIS — Z85828 Personal history of other malignant neoplasm of skin: Secondary | ICD-10-CM | POA: Diagnosis not present

## 2022-03-31 NOTE — Telephone Encounter (Signed)
Discussed the recommendations with the patient. No further questions or concerns. Thanks me for the call. ?

## 2022-04-02 DIAGNOSIS — M25562 Pain in left knee: Secondary | ICD-10-CM | POA: Diagnosis not present

## 2022-04-02 DIAGNOSIS — M1712 Unilateral primary osteoarthritis, left knee: Secondary | ICD-10-CM | POA: Diagnosis not present

## 2022-04-04 DIAGNOSIS — M1712 Unilateral primary osteoarthritis, left knee: Secondary | ICD-10-CM | POA: Insufficient documentation

## 2022-04-08 ENCOUNTER — Ambulatory Visit (INDEPENDENT_AMBULATORY_CARE_PROVIDER_SITE_OTHER): Payer: Medicare Other | Admitting: Family Medicine

## 2022-04-08 VITALS — BP 162/68 | HR 66 | Temp 97.9°F | Ht 71.0 in | Wt 202.2 lb

## 2022-04-08 DIAGNOSIS — K5900 Constipation, unspecified: Secondary | ICD-10-CM

## 2022-04-08 NOTE — Progress Notes (Signed)
? ?Subjective:  ? ? Patient ID: David Bush, male    DOB: [birth year], 88 y.o.   MRN: [id number] ? ?HPI patient has been battling constipation now for 6 weeks.  He states that he has gone to GI and they gave him a recommendation of using MiraLAX which she has been taking every day.  He states he feels like that there is a stool sitting just above his rectum that he is unable to pass.  He states occasionally most will come out.  Occasionally he will have a bowel movement this is small hard ball.  However he is never able to completely evacuate his stool.  He continues to take MiraLAX and has also been using fiber and drinking more water. ? ? ?Past Medical History:  ?Diagnosis Date  ? Atrial fibrillation ([place])   ? Barrett's esophagus   ? Cataract   ? surgery to remove bilateral  ? Chest pain   ? in feb 2020/ saw cardiologist- no heart attack!  ? CKD (chronic kidney disease) stage 3, GFR 30-59 ml/min (HCC)   ? GERD (gastroesophageal reflux disease)   ? Hx of hiatal hernia   ? Hyperlipidemia   ? Hypertension   ? Skin cancer   ? squamous cell of face/arm/legs  ? Substance abuse ([place])   ? stopped drinking in 1993  ? ?Past Surgical History:  ?Procedure Laterality Date  ? CATARACT EXTRACTION, BILATERAL  2012  ? COLONOSCOPY    ? KNEE ARTHROSCOPY Left 2010  ? torn meniscus  ? SKIN CANCER EXCISION  2015, 2012  ? face, arms and legs  ? UPPER GASTROINTESTINAL ENDOSCOPY    ? barrett's esophagus  ? [place] EXTRACTION  1980  ? WISDOM TOOTH EXTRACTION    ? ?Current Outpatient Medications on File Prior to Visit  ?Medication Sig Dispense Refill  ? acetaminophen (TYLENOL) 325 MG tablet Take 650 mg by mouth every 6 (six) hours as needed for moderate pain or headache.    ? benazepril (LOTENSIN) 40 MG tablet TAKE 1 TABLET DAILY 90 tablet 3  ? ELIQUIS 2.5 MG TABS tablet TAKE 1 TABLET TWICE A DAY 180 tablet 3  ? finasteride (PROSCAR) 5 MG tablet TAKE 1 TABLET DAILY (NEED OFFICE VISIT) 90 tablet 3  ? flecainide (TAMBOCOR) 100 MG  tablet TAKE 1 TABLET TWICE A DAY 180 tablet 1  ? metoprolol succinate (TOPROL XL) 25 MG 24 hr tablet Take 1 tablet (25 mg total) by mouth daily. 90 tablet 3  ? Multiple Vitamins-Minerals (CENTRUM SILVER PO) Take 1 tablet by mouth daily.    ? nitroGLYCERIN (NITROSTAT) 0.4 MG SL tablet Place 1 tablet (0.4 mg total) under the tongue every 5 (five) minutes as needed for chest pain. 25 tablet 3  ? pantoprazole (PROTONIX) 40 MG tablet TAKE 1 TABLET DAILY 90 tablet 3  ? rosuvastatin (CRESTOR) 20 MG tablet TAKE 1 TABLET DAILY 90 tablet 3  ? amLODipine (NORVASC) 10 MG tablet Take 5 mg by mouth daily. (Patient not taking: Reported on 04/08/2022)    ? ?No current facility-administered medications on file prior to visit.  ? ?Allergies  ?Allergen Reactions  ? Diltiazem Itching and Rash  ? ?Social History  ? ?Socioeconomic History  ? Marital status: Married  ?  Spouse name: Not on file  ? Number of children: 1  ? Years of education: Not on file  ? Highest education level: Not on file  ?Occupational History  ? Not on file  ?Tobacco Use  ? Smoking  status: Former  ?  Years: 16.00  ?  Types: Cigarettes  ?  Quit date: 12/06/1966  ?  Years since quitting: 55.3  ? Smokeless tobacco: Never  ?Vaping Use  ? Vaping Use: Never used  ?Substance and Sexual Activity  ? Alcohol use: No  ?  Alcohol/week: 0.0 standard drinks  ?  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 Drug use: No  ? Sexual activity: Not on file  ?Other Topics Concern  ? Not on file  ?Social History Narrative  ? Not on file  ? ?Social Determinants of Health  ? ?Financial Resource Strain: Not on file  ?Food Insecurity: Not on file  ?Transportation Needs: Not on file  ?Physical Activity: Not on file  ?Stress: Not on file  ?Social Connections: Not on file  ?Intimate Partner Violence: Not on file  ? ?Family History  ?Problem Relation Age of Onset  ? Esophageal cancer Mother   ? Stroke Father   ? Kidney cancer Sister   ? Kidney disease Brother   ? Other Brother   ?  Heart disease Brother   ? Colon cancer Neg Hx   ? Colon polyps Neg Hx   ? Rectal cancer Neg Hx   ? Stomach cancer Neg Hx   ? ? ? ? ?Review of Systems  ?All other systems reviewed and are negative. ? ?   ?Objective:  ? Physical Exam ?Vitals reviewed.  ?Constitutional:   ?   General: He is not in acute distress. ?   Appearance: He is well-developed. He is not diaphoretic.  ?HENT:  ?   Head: Normocephalic and atraumatic.  ?   Right Ear: External ear normal.  ?   Left Ear: External ear normal.  ?   Nose: Nose normal.  ?   Mouth/Throat:  ?   Pharynx: No oropharyngeal exudate.  ?Eyes:  ?   General: No scleral icterus.    ?   Right eye: No discharge.     ?   Left eye: No discharge.  ?   Conjunctiva/sclera: Conjunctivae normal.  ?   Pupils: Pupils are equal, round, and reactive to light.  ?Neck:  ?   Thyroid: No thyromegaly.  ?   Vascular: No JVD.  ?   Trachea: No tracheal deviation.  ?Cardiovascular:  ?   Rate and Rhythm: Normal rate and regular rhythm.  ?   Heart sounds: Murmur heard.  ?  No friction rub. No gallop.  ?Pulmonary:  ?   Effort: Pulmonary effort is normal. No respiratory distress.  ?   Breath sounds: Normal breath sounds. No stridor. No wheezing or rales.  ?Chest:  ?   Chest wall: No tenderness.  ?Abdominal:  ?   General: Bowel sounds are normal. There is no distension.  ?   Palpations: Abdomen is soft. There is no mass.  ?   Tenderness: There is no abdominal tenderness. There is no guarding or rebound.  ?Genitourinary: ?   Penis: Normal.   ?   Prostate: Normal.  ?   Rectum: Normal.  ?Musculoskeletal:     ?   General: No deformity.  ?   Cervical back: Normal range of motion and neck supple.  ?Lymphadenopathy:  ?   Cervical: No cervical adenopathy.  ?Skin: ?   General: Skin is warm.  ?   Coloration: Skin is not pale.  ?   Findings: No erythema or rash.  ?Neurological:  ?   Mental Status: He is  alert and oriented to person, place, and time.  ?   Cranial Nerves: No cranial nerve deficit.  ?   Motor: No abnormal  muscle tone.  ?   Coordination: Coordination normal.  ?   Deep Tendon Reflexes: Reflexes are normal and symmetric.  ?Psychiatric:     ?   Behavior: Behavior normal.     ?   Thought Content: Thought content normal.     ?   Judgment: Judgment normal.  ? ? ? ? ? ?   ?Assessment & Plan:  ?Constipation, unspecified constipation type ?We discussed doing a rectal exam and trying to manually disimpact the patient however he declines this today.  Instead I recommended using fleets enemas 1-2 times a day to try to soften any impaction and then to add a stimulant laxative to the MiraLAX that the Dulcolax or senna to help with increasing intestinal motility to "push out" any blockage.  Return for disimpaction if unsuccessful ?

## 2022-05-24 DIAGNOSIS — C44729 Squamous cell carcinoma of skin of left lower limb, including hip: Secondary | ICD-10-CM | POA: Diagnosis not present

## 2022-05-25 DIAGNOSIS — L988 Other specified disorders of the skin and subcutaneous tissue: Secondary | ICD-10-CM | POA: Diagnosis not present

## 2022-05-25 DIAGNOSIS — C44622 Squamous cell carcinoma of skin of right upper limb, including shoulder: Secondary | ICD-10-CM | POA: Diagnosis not present

## 2022-06-04 DIAGNOSIS — L0889 Other specified local infections of the skin and subcutaneous tissue: Secondary | ICD-10-CM | POA: Diagnosis not present

## 2022-06-18 DIAGNOSIS — C44329 Squamous cell carcinoma of skin of other parts of face: Secondary | ICD-10-CM | POA: Diagnosis not present

## 2022-06-24 DIAGNOSIS — M1712 Unilateral primary osteoarthritis, left knee: Secondary | ICD-10-CM | POA: Diagnosis not present

## 2022-07-01 DIAGNOSIS — M1712 Unilateral primary osteoarthritis, left knee: Secondary | ICD-10-CM | POA: Diagnosis not present

## 2022-07-22 ENCOUNTER — Other Ambulatory Visit: Payer: Self-pay | Admitting: Family Medicine

## 2022-07-22 NOTE — Telephone Encounter (Signed)
Requested Prescriptions  Pending Prescriptions Disp Refills  . finasteride (PROSCAR) 5 MG tablet [Pharmacy Med Name: FINASTERIDE TABS '5MG'$ ] 90 tablet 1    Sig: TAKE 1 TABLET DAILY (NEED OFFICE VISIT)     Urology: 5-alpha Reductase Inhibitors Passed - 07/22/2022  3:16 AM      Passed - PSA in normal range and within 360 days    PSA  Date Value Ref Range Status  09/30/2021 1.98 < OR = 4.00 ng/mL Final    Comment:    The total PSA value from this assay system is  standardized against the WHO standard. The test  result will be approximately 20% lower when compared  to the equimolar-standardized total PSA (Beckman  Coulter). Comparison of serial PSA results should be  interpreted with this fact in mind. . This test was performed using the Siemens  chemiluminescent method. Values obtained from  different assay methods cannot be used interchangeably. PSA levels, regardless of value, should not be interpreted as absolute evidence of the presence or absence of disease.          Passed - Valid encounter within last 12 months    Recent Outpatient Visits          3 months ago Constipation, unspecified constipation type   [place] Pick[facility], Cammie Mcgee, MD   9 months ago General medical exam   [place] [name], MD   1 year ago General medical exam   [place] [name], MD   2 years ago Drop in hemoglobin   [place], Cammie Mcgee, MD   3 years ago Orthostatic hypotension   [place] Pickard, Cammie Mcgee, MD

## 2022-08-02 ENCOUNTER — Other Ambulatory Visit: Payer: Self-pay | Admitting: Family Medicine

## 2022-08-03 NOTE — Telephone Encounter (Signed)
Patient will need to schedule an office visit for further refills. Requested Prescriptions  Pending Prescriptions Disp Refills  . rosuvastatin (CRESTOR) 20 MG tablet [Pharmacy Med Name: ROSUVASTATIN TABS '20MG'$ ] 90 tablet 0    Sig: TAKE 1 TABLET DAILY     Cardiovascular:  Antilipid - Statins 2 Failed - 08/02/2022  3:30 AM      Failed - Lipid Panel in normal range within the last 12 months    Cholesterol  Date Value Ref Range Status  09/30/2021 110 <200 mg/dL Final   LDL Cholesterol (Calc)  Date Value Ref Range Status  09/30/2021 50 mg/dL (calc) Final    Comment:    Reference range: <100 . Desirable range <100 mg/dL for primary prevention;   <70 mg/dL for patients with CHD or diabetic patients  with > or = 2 CHD risk factors. Marland Kitchen LDL-C is now calculated using the Martin-Hopkins  calculation, which is a validated novel method providing  better accuracy than the Friedewald equation in the  estimation of LDL-C.  Cresenciano Genre et al. Annamaria Helling. 8850;277(41): 2061-2068  (http://education.[facility].com/faq/FAQ164)    HDL  Date Value Ref Range Status  09/30/2021 40 > OR = 40 mg/dL Final   Triglycerides  Date Value Ref Range Status  09/30/2021 116 <150 mg/dL Final         Passed - Cr in normal range and within 360 days    Creat  Date Value Ref Range Status  09/30/2021 1.33 (H) 0.70 - 1.22 mg/dL Final   Creatinine, Ser  Date Value Ref Range Status  03/08/2022 1.25 0.76 - 1.27 mg/dL Final         Passed - Patient is not pregnant      Passed - Valid encounter within last 12 months    Recent Outpatient Visits          3 months ago Constipation, unspecified constipation type   [place] [facility], Cammie Mcgee, MD   10 months ago General medical exam   [place] [name], MD   1 year ago General medical exam   [place] [name], MD   2 years ago Drop in hemoglobin   [place],  Cammie Mcgee, MD   3 years ago Orthostatic hypotension   [place] Pickard, Cammie Mcgee, MD

## 2022-08-12 DIAGNOSIS — M1712 Unilateral primary osteoarthritis, left knee: Secondary | ICD-10-CM | POA: Diagnosis not present

## 2022-08-30 ENCOUNTER — Other Ambulatory Visit: Payer: Self-pay | Admitting: Cardiology

## 2022-08-30 DIAGNOSIS — I480 Paroxysmal atrial fibrillation: Secondary | ICD-10-CM

## 2022-09-27 ENCOUNTER — Other Ambulatory Visit: Payer: Self-pay | Admitting: Cardiology

## 2022-09-27 DIAGNOSIS — I480 Paroxysmal atrial fibrillation: Secondary | ICD-10-CM

## 2022-09-27 NOTE — Telephone Encounter (Signed)
Prescription refill request for Eliquis received. Indication:Afib Last office visit:4/23 Scr:1.2 Age: 88 Weight:91.7 kg  Prescription refilled

## 2022-10-06 ENCOUNTER — Other Ambulatory Visit: Payer: Medicare Other

## 2022-10-06 ENCOUNTER — Ambulatory Visit: Payer: Medicare Other

## 2022-10-06 DIAGNOSIS — R350 Frequency of micturition: Secondary | ICD-10-CM | POA: Diagnosis not present

## 2022-10-06 DIAGNOSIS — Z125 Encounter for screening for malignant neoplasm of prostate: Secondary | ICD-10-CM

## 2022-10-06 DIAGNOSIS — I10 Essential (primary) hypertension: Secondary | ICD-10-CM

## 2022-10-06 DIAGNOSIS — E7800 Pure hypercholesterolemia, unspecified: Secondary | ICD-10-CM | POA: Diagnosis not present

## 2022-10-06 DIAGNOSIS — N183 Chronic kidney disease, stage 3 unspecified: Secondary | ICD-10-CM

## 2022-10-07 ENCOUNTER — Other Ambulatory Visit: Payer: Medicare Other

## 2022-10-07 LAB — CBC WITH DIFFERENTIAL/PLATELET
Absolute Monocytes: 608 cells/uL (ref 200–950)
Basophils Absolute: 48 cells/uL (ref 0–200)
Basophils Relative: 0.6 %
Eosinophils Absolute: 232 cells/uL (ref 15–500)
Eosinophils Relative: 2.9 %
HCT: 44.8 % (ref 38.5–50.0)
Hemoglobin: 14.8 g/dL (ref 13.2–17.1)
Lymphs Abs: 1664 cells/uL (ref 850–3900)
MCH: 31.2 pg (ref 27.0–33.0)
MCHC: 33 g/dL (ref 32.0–36.0)
MCV: 94.3 fL (ref 80.0–100.0)
MPV: 10.1 fL (ref 7.5–12.5)
Monocytes Relative: 7.6 %
Neutro Abs: 5448 cells/uL (ref 1500–7800)
Neutrophils Relative %: 68.1 %
Platelets: 197 10*3/uL (ref 140–400)
RBC: 4.75 10*6/uL (ref 4.20–5.80)
RDW: 12 % (ref 11.0–15.0)
Total Lymphocyte: 20.8 %
WBC: 8 10*3/uL (ref 3.8–10.8)

## 2022-10-07 LAB — PSA: PSA: 2.78 ng/mL (ref <=4.00)

## 2022-10-07 LAB — COMPLETE METABOLIC PANEL WITH GFR
AG Ratio: 2 (calc) (ref 1.0–2.5)
ALT: 16 U/L (ref 9–46)
AST: 19 U/L (ref 10–35)
Albumin: 4.3 g/dL (ref 3.6–5.1)
Alkaline phosphatase (APISO): 61 U/L (ref 35–144)
BUN/Creatinine Ratio: 13 (calc) (ref 6–22)
BUN: 18 mg/dL (ref 7–25)
CO2: 27 mmol/L (ref 20–32)
Calcium: 9.3 mg/dL (ref 8.6–10.3)
Chloride: 104 mmol/L (ref 98–110)
Creat: 1.41 mg/dL — ABNORMAL HIGH (ref 0.70–1.22)
Globulin: 2.2 g/dL (calc) (ref 1.9–3.7)
Glucose, Bld: 107 mg/dL — ABNORMAL HIGH (ref 65–99)
Potassium: 4.7 mmol/L (ref 3.5–5.3)
Sodium: 140 mmol/L (ref 135–146)
Total Bilirubin: 0.5 mg/dL (ref 0.2–1.2)
Total Protein: 6.5 g/dL (ref 6.1–8.1)
eGFR: 49 mL/min/{1.73_m2} — ABNORMAL LOW (ref >=60)

## 2022-10-07 LAB — LIPID PANEL
Cholesterol: 119 mg/dL (ref <200)
HDL: 42 mg/dL (ref >=40)
LDL Cholesterol (Calc): 54 mg/dL (calc)
Non-HDL Cholesterol (Calc): 77 mg/dL (calc) (ref <130)
Total CHOL/HDL Ratio: 2.8 (calc) (ref <5.0)
Triglycerides: 149 mg/dL (ref <150)

## 2022-10-08 ENCOUNTER — Other Ambulatory Visit (INDEPENDENT_AMBULATORY_CARE_PROVIDER_SITE_OTHER): Payer: Medicare Other

## 2022-10-08 DIAGNOSIS — Z23 Encounter for immunization: Secondary | ICD-10-CM | POA: Diagnosis not present

## 2022-10-12 ENCOUNTER — Ambulatory Visit (INDEPENDENT_AMBULATORY_CARE_PROVIDER_SITE_OTHER): Payer: Medicare Other | Admitting: Family Medicine

## 2022-10-12 VITALS — BP 112/60 | HR 50 | Ht 71.0 in | Wt 202.0 lb

## 2022-10-12 DIAGNOSIS — I480 Paroxysmal atrial fibrillation: Secondary | ICD-10-CM | POA: Diagnosis not present

## 2022-10-12 DIAGNOSIS — E7800 Pure hypercholesterolemia, unspecified: Secondary | ICD-10-CM

## 2022-10-12 DIAGNOSIS — I10 Essential (primary) hypertension: Secondary | ICD-10-CM

## 2022-10-12 DIAGNOSIS — Z0000 Encounter for general adult medical examination without abnormal findings: Secondary | ICD-10-CM | POA: Diagnosis not present

## 2022-10-12 DIAGNOSIS — N183 Chronic kidney disease, stage 3 unspecified: Secondary | ICD-10-CM

## 2022-10-12 NOTE — Progress Notes (Signed)
Subjective:    Patient ID: David Bush, male    DOB: [birth year], 88 y.o.   MRN: [id number]  HPI Patient is a very pleasant 88 year old Caucasian gentleman here today for complete physical exam.  His blood pressure is outstanding at 112/60.  He denies any chest pain shortness of breath or dyspnea on exertion.  He states that he is no longer playing golf but he is going to the gym every day and exercising.  He is reading for fun.  He denies any memory loss.  He denies any falls.  He denies any depression.  He is due for a shingles vaccine as well as an RSV vaccine.  We discussed these today.  His most recent lab work is listed in left below.  Aside from a mild elevation in his blood sugar, his creatinine is increased Immunization History  Administered Date(s) Administered   Fluad Quad(high Dose 65+) 09/28/2019, 09/01/2020, 10/05/2021, 10/08/2022   Influenza, High Dose Seasonal PF 10/02/1996, 08/25/2017   Influenza,inj,Quad PF,6+ Mos 08/31/2016, 08/30/2018   Influenza-Unspecified 10/06/1997, 10/03/1998, 11/21/2000, 10/13/2001, 10/04/2002, 09/12/2003, 10/07/2004, 09/05/2013   PFIZER Comirnaty(Gray Top)Covid-19 Tri-Sucrose Vaccine 04/13/2021   PFIZER(Purple Top)SARS-COV-2 Vaccination 12/31/2019, 01/21/2020, 09/08/2020   Pneumococcal Conjugate-13 10/01/2016   Pneumococcal Polysaccharide-23 12/06/2013   Pneumococcal-Unspecified 08/28/2001   Covid vaccines are up-to-date and he recently had his booster Lab on 10/06/2022  Component Date Value Ref Range Status   WBC 10/06/2022 8.0  3.8 - 10.8 Thousand/uL Final   RBC 10/06/2022 4.75  4.20 - 5.80 Million/uL Final   Hemoglobin 10/06/2022 14.8  13.2 - 17.1 g/dL Final   HCT 10/06/2022 44.8  38.5 - 50.0 % Final   MCV 10/06/2022 94.3  80.0 - 100.0 fL Final   MCH 10/06/2022 31.2  27.0 - 33.0 pg Final   MCHC 10/06/2022 33.0  32.0 - 36.0 g/dL Final   RDW 10/06/2022 12.0  11.0 - 15.0 % Final   Platelets 10/06/2022 197  140 - 400 Thousand/uL Final    MPV 10/06/2022 10.1  7.5 - 12.5 fL Final   Neutro Abs 10/06/2022 5,448  1,500 - 7,800 cells/uL Final   Lymphs Abs 10/06/2022 1,664  850 - 3,900 cells/uL Final   Absolute Monocytes 10/06/2022 608  200 - 950 cells/uL Final   Eosinophils Absolute 10/06/2022 232  15 - 500 cells/uL Final   Basophils Absolute 10/06/2022 48  0 - 200 cells/uL Final   Neutrophils Relative % 10/06/2022 68.1  % Final   Total Lymphocyte 10/06/2022 20.8  % Final   Monocytes Relative 10/06/2022 7.6  % Final   Eosinophils Relative 10/06/2022 2.9  % Final   Basophils Relative 10/06/2022 0.6  % Final   Glucose, Bld 10/06/2022 107 (H)  65 - 99 mg/dL Final   Comment: .            Fasting reference interval . For someone without known diabetes, a glucose value between 100 and 125 mg/dL is consistent with prediabetes and should be confirmed with a follow-up test. .    BUN 10/06/2022 18  7 - 25 mg/dL Final   Creat 10/06/2022 1.41 (H)  0.70 - 1.22 mg/dL Final   eGFR 10/06/2022 49 (L)  > OR = 60 mL/min/1.28m Final   BUN/Creatinine Ratio 10/06/2022 13  6 - 22 (calc) Final   Sodium 10/06/2022 140  135 - 146 mmol/L Final   Potassium 10/06/2022 4.7  3.5 - 5.3 mmol/L Final   Chloride 10/06/2022 104  98 - 110 mmol/L Final  CO2 10/06/2022 27  20 - 32 mmol/L Final   Calcium 10/06/2022 9.3  8.6 - 10.3 mg/dL Final   Total Protein 10/06/2022 6.5  6.1 - 8.1 g/dL Final   Albumin 10/06/2022 4.3  3.6 - 5.1 g/dL Final   Globulin 10/06/2022 2.2  1.9 - 3.7 g/dL (calc) Final   AG Ratio 10/06/2022 2.0  1.0 - 2.5 (calc) Final   Total Bilirubin 10/06/2022 0.5  0.2 - 1.2 mg/dL Final   Alkaline phosphatase (APISO) 10/06/2022 61  35 - 144 U/L Final   AST 10/06/2022 19  10 - 35 U/L Final   ALT 10/06/2022 16  9 - 46 U/L Final   Cholesterol 10/06/2022 119  <200 mg/dL Final   HDL 10/06/2022 42  > OR = 40 mg/dL Final   Triglycerides 10/06/2022 149  <150 mg/dL Final   LDL Cholesterol (Calc) 10/06/2022 54  mg/dL (calc) Final   Comment:  Reference range: <100 . Desirable range <100 mg/dL for primary prevention;   <70 mg/dL for patients with CHD or diabetic patients  with > or = 2 CHD risk factors. Marland Kitchen LDL-C is now calculated using the Martin-Hopkins  calculation, which is a validated novel method providing  better accuracy than the Friedewald equation in the  estimation of LDL-C.  Cresenciano Genre et al. Annamaria Helling. 0973;532(99): 2061-2068  (http://education.[facility].com/faq/FAQ164)    Total CHOL/HDL Ratio 10/06/2022 2.8  <5.0 (calc) Final   Non-HDL Cholesterol (Calc) 10/06/2022 77  <130 mg/dL (calc) Final   Comment: For patients with diabetes plus 1 major ASCVD risk  factor, treating to a non-HDL-C goal of <100 mg/dL  (LDL-C of <70 mg/dL) is considered a therapeutic  option.    PSA 10/06/2022 2.78  < OR = 4.00 ng/mL Final   Comment: The total PSA value from this assay system is  standardized against the WHO standard. The test  result will be approximately 20% lower when compared  to the equimolar-standardized total PSA (Beckman  Coulter). Comparison of serial PSA results should be  interpreted with this fact in mind. . This test was performed using the Siemens  chemiluminescent method. Values obtained from  different assay methods cannot be used interchangeably. PSA levels, regardless of value, should not be interpreted as absolute evidence of the presence or absence of disease.     Past Medical History:  Diagnosis Date   Atrial fibrillation ([place])    Barrett's esophagus    Cataract    surgery to remove bilateral   Chest pain    in feb 2020/ saw cardiologist- no heart attack!   CKD (chronic kidney disease) stage 3, GFR 30-59 ml/min (HCC)    GERD (gastroesophageal reflux disease)    Hx of hiatal hernia    Hyperlipidemia    Hypertension    Skin cancer    squamous cell of face/arm/legs   Substance abuse ([place])    stopped drinking in 1993   Past Surgical History:  Procedure Laterality Date   CATARACT  EXTRACTION, BILATERAL  2012   COLONOSCOPY     KNEE ARTHROSCOPY Left 2010   torn meniscus   SKIN CANCER EXCISION  2015, 2012   face, arms and legs   UPPER GASTROINTESTINAL ENDOSCOPY     barrett's esophagus   WISDOM TOOTH EXTRACTION  1980   WISDOM TOOTH EXTRACTION     Current Outpatient Medications on File Prior to Visit  Medication Sig Dispense Refill   amLODipine (NORVASC) 5 MG tablet Take 5 mg by mouth daily.  acetaminophen (TYLENOL) 325 MG tablet Take 650 mg by mouth every 6 (six) hours as needed for moderate pain or headache.     benazepril (LOTENSIN) 40 MG tablet TAKE 1 TABLET DAILY 90 tablet 3   ELIQUIS 2.5 MG TABS tablet TAKE 1 TABLET TWICE A DAY 180 tablet 3   finasteride (PROSCAR) 5 MG tablet TAKE 1 TABLET DAILY (NEED OFFICE VISIT) 90 tablet 3   flecainide (TAMBOCOR) 100 MG tablet TAKE 1 TABLET TWICE A DAY 180 tablet 3   metoprolol succinate (TOPROL XL) 25 MG 24 hr tablet Take 1 tablet (25 mg total) by mouth daily. 90 tablet 3   Multiple Vitamins-Minerals (CENTRUM SILVER PO) Take 1 tablet by mouth daily.     nitroGLYCERIN (NITROSTAT) 0.4 MG SL tablet Place 1 tablet (0.4 mg total) under the tongue every 5 (five) minutes as needed for chest pain. 25 tablet 3   pantoprazole (PROTONIX) 40 MG tablet TAKE 1 TABLET DAILY 90 tablet 3   rosuvastatin (CRESTOR) 20 MG tablet TAKE 1 TABLET DAILY 90 tablet 0   No current facility-administered medications on file prior to visit.   Allergies  Allergen Reactions   Diltiazem Itching and Rash   Social History   Socioeconomic History   Marital status: Married    Spouse name: Not on file   Number of children: 1   Years of education: Not on file   Highest education level: Not on file  Occupational History   Not on file  Tobacco Use   Smoking status: Former    Years: 16.00    Types: Cigarettes    Quit date: 12/06/1966    Years since quitting: 55.8   Smokeless tobacco: Never  Vaping Use   Vaping Use: Never used  Substance and  Sexual Activity   Alcohol use: No    Alcohol/week: 0.0 standard drinks of alcohol    Comment: 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   Drug use: No   Sexual activity: Not on file  Other Topics Concern   Not on file  Social History Narrative   Not on file   Social Determinants of Health   Financial Resource Strain: Not on file  Food Insecurity: Not on file  Transportation Needs: Not on file  Physical Activity: Not on file  Stress: Not on file  Social Connections: Not on file  Intimate Partner Violence: Not on file   Family History  Problem Relation Age of Onset   Esophageal cancer Mother    Stroke Father    Kidney cancer Sister    Kidney disease Brother    Other Brother    Heart disease Brother    Colon cancer Neg Hx    Colon polyps Neg Hx    Rectal cancer Neg Hx    Stomach cancer Neg Hx       Review of Systems  All other systems reviewed and are negative.      Objective:   Physical Exam Vitals reviewed.  Constitutional:      General: He is not in acute distress.    Appearance: He is well-developed. He is not diaphoretic.  HENT:     Head: Normocephalic and atraumatic.     Right Ear: External ear normal.     Left Ear: External ear normal.     Nose: Nose normal.     Mouth/Throat:     Pharynx: No oropharyngeal exudate.  Eyes:     General: No scleral icterus.         Right eye: No discharge.        Left eye: No discharge.     Conjunctiva/sclera: Conjunctivae normal.     Pupils: Pupils are equal, round, and reactive to light.  Neck:     Thyroid: No thyromegaly.     Vascular: No JVD.     Trachea: No tracheal deviation.  Cardiovascular:     Rate and Rhythm: Normal rate and regular rhythm.     Heart sounds: Murmur heard.     No friction rub. No gallop.  Pulmonary:     Effort: Pulmonary effort is normal. No respiratory distress.     Breath sounds: Normal breath sounds. No stridor. No wheezing or rales.  Chest:     Chest wall: No tenderness.   Abdominal:     General: Bowel sounds are normal. There is no distension.     Palpations: Abdomen is soft. There is no mass.     Tenderness: There is no abdominal tenderness. There is no guarding or rebound.  Genitourinary:    Penis: Normal.      Prostate: Normal.     Rectum: Normal.  Musculoskeletal:        General: No deformity.     Cervical back: Normal range of motion and neck supple.  Lymphadenopathy:     Cervical: No cervical adenopathy.  Skin:    General: Skin is warm.     Coloration: Skin is not pale.     Findings: No erythema or rash.  Neurological:     Mental Status: He is alert and oriented to person, place, and time.     Cranial Nerves: No cranial nerve deficit.     Motor: No abnormal muscle tone.     Coordination: Coordination normal.     Deep Tendon Reflexes: Reflexes are normal and symmetric.  Psychiatric:        Behavior: Behavior normal.        Thought Content: Thought content normal.        Judgment: Judgment normal.           Assessment & Plan:  General medical exam  Benign essential HTN  Pure hypercholesterolemia  CRI (chronic renal insufficiency), stage 3 (moderate) (HCC)  Paroxysmal atrial fibrillation (HCC) Patient is in normal sinus rhythm today and doing quite well.  I recommended shingles vaccine.  We also discussed the RSV vaccine.  His lab work is outstanding aside from a mild increase in his creatinine.  I recommended trying to drink more water and rechecking a BMP in 1 to 2 weeks as a suspect that he is slightly dehydrated.  He has been restricting his fluid due to 2-3 episodes of nocturia every evening affecting his sleep.  The remainder of his immunizations are up-to-date.  He denies any falls or depression or memory loss

## 2022-10-14 DIAGNOSIS — L821 Other seborrheic keratosis: Secondary | ICD-10-CM | POA: Diagnosis not present

## 2022-10-14 DIAGNOSIS — L578 Other skin changes due to chronic exposure to nonionizing radiation: Secondary | ICD-10-CM | POA: Diagnosis not present

## 2022-10-14 DIAGNOSIS — D225 Melanocytic nevi of trunk: Secondary | ICD-10-CM | POA: Diagnosis not present

## 2022-10-14 DIAGNOSIS — Z85828 Personal history of other malignant neoplasm of skin: Secondary | ICD-10-CM | POA: Diagnosis not present

## 2022-10-14 DIAGNOSIS — D485 Neoplasm of uncertain behavior of skin: Secondary | ICD-10-CM | POA: Diagnosis not present

## 2022-10-14 DIAGNOSIS — L570 Actinic keratosis: Secondary | ICD-10-CM | POA: Diagnosis not present

## 2022-10-20 ENCOUNTER — Other Ambulatory Visit: Payer: Self-pay

## 2022-10-20 DIAGNOSIS — I10 Essential (primary) hypertension: Secondary | ICD-10-CM

## 2022-10-20 MED ORDER — AMLODIPINE BESYLATE 5 MG PO TABS: 5.0000 mg | ORAL_TABLET | Freq: Every day | ORAL | 3 refills | Status: DC

## 2022-10-26 ENCOUNTER — Other Ambulatory Visit: Payer: Medicare Other

## 2022-10-26 DIAGNOSIS — N183 Chronic kidney disease, stage 3 unspecified: Secondary | ICD-10-CM | POA: Diagnosis not present

## 2022-10-27 LAB — COMPREHENSIVE METABOLIC PANEL
AG Ratio: 1.8 (calc) (ref 1.0–2.5)
ALT: 18 U/L (ref 9–46)
AST: 21 U/L (ref 10–35)
Albumin: 4.3 g/dL (ref 3.6–5.1)
Alkaline phosphatase (APISO): 63 U/L (ref 35–144)
BUN/Creatinine Ratio: 11 (calc) (ref 6–22)
BUN: 15 mg/dL (ref 7–25)
CO2: 29 mmol/L (ref 20–32)
Calcium: 9.3 mg/dL (ref 8.6–10.3)
Chloride: 104 mmol/L (ref 98–110)
Creat: 1.33 mg/dL — ABNORMAL HIGH (ref 0.70–1.22)
Globulin: 2.4 g/dL (calc) (ref 1.9–3.7)
Glucose, Bld: 105 mg/dL — ABNORMAL HIGH (ref 65–99)
Potassium: 4.6 mmol/L (ref 3.5–5.3)
Sodium: 139 mmol/L (ref 135–146)
Total Bilirubin: 0.4 mg/dL (ref 0.2–1.2)
Total Protein: 6.7 g/dL (ref 6.1–8.1)

## 2022-10-29 ENCOUNTER — Other Ambulatory Visit: Payer: Self-pay | Admitting: Family Medicine

## 2022-10-29 NOTE — Telephone Encounter (Signed)
Requested Prescriptions  Pending Prescriptions Disp Refills   rosuvastatin (CRESTOR) 20 MG tablet [Pharmacy Med Name: ROSUVASTATIN TABS '20MG'$ ] 90 tablet 3    Sig: TAKE 1 TABLET DAILY (WILL NEED TO SCHEDULE AN OFFICE VISIT FOR FURTHER REFILLS)     Cardiovascular:  Antilipid - Statins 2 Failed - 10/29/2022  2:19 AM      Failed - Cr in normal range and within 360 days    Creat  Date Value Ref Range Status  10/26/2022 1.33 (H) 0.70 - 1.22 mg/dL Final         Failed - Lipid Panel in normal range within the last 12 months    Cholesterol  Date Value Ref Range Status  10/06/2022 119 <200 mg/dL Final   LDL Cholesterol (Calc)  Date Value Ref Range Status  10/06/2022 54 mg/dL (calc) Final    Comment:    Reference range: <100 . Desirable range <100 mg/dL for primary prevention;   <70 mg/dL for patients with CHD or diabetic patients  with > or = 2 CHD risk factors. Marland Kitchen LDL-C is now calculated using the Martin-Hopkins  calculation, which is a validated novel method providing  better accuracy than the Friedewald equation in the  estimation of LDL-C.  Cresenciano Genre et al. Annamaria Helling. 7209;470(96): 2061-2068  (http://education.[facility].com/faq/FAQ164)    HDL  Date Value Ref Range Status  10/06/2022 42 > OR = 40 mg/dL Final   Triglycerides  Date Value Ref Range Status  10/06/2022 149 <150 mg/dL Final         Passed - Patient is not pregnant      Passed - Valid encounter within last 12 months    Recent Outpatient Visits           6 months ago Constipation, unspecified constipation type   [place] Pick[place], Cammie Mcgee, MD   1 year ago General medical exam   [place] [name], MD   2 years ago General medical exam   [place] [name], MD   3 years ago Drop in hemoglobin   [place], Cammie Mcgee, MD   3 years ago Orthostatic hypotension   [place], Cammie Mcgee,  MD       Future Appointments             In 1 week [place], Ocie D[place], [facility], [facility]

## 2022-11-11 ENCOUNTER — Encounter: Payer: Self-pay | Admitting: Cardiology

## 2022-11-11 ENCOUNTER — Ambulatory Visit: Payer: Medicare Other | Attending: Cardiology | Admitting: Cardiology

## 2022-11-11 VITALS — BP 114/67 | HR 50 | Ht 72.0 in | Wt 203.0 lb

## 2022-11-11 DIAGNOSIS — D6869 Other thrombophilia: Secondary | ICD-10-CM

## 2022-11-11 DIAGNOSIS — I480 Paroxysmal atrial fibrillation: Secondary | ICD-10-CM | POA: Diagnosis not present

## 2022-11-11 DIAGNOSIS — Z79899 Other long term (current) drug therapy: Secondary | ICD-10-CM

## 2022-11-11 NOTE — Progress Notes (Signed)
Electrophysiology Office Note   Date:  11/11/2022   ID:  Dhanush, Jokerst [birth year], MRN [id number]  PCP:  [name], MD  Cardiologist:   Primary Electrophysiologist:  Zai Chmiel Meredith Leeds, MD    No chief complaint on file.     History of Present Illness: David Bush is a 88 y.o. male who is being seen today for the evaluation of bradycardia, palpitations at the request of [name], MD. Presenting today for electrophysiology evaluation.    History significant for Barrett's [place], CKD stage III, hypertension, hyperlipidemia, atrial fibrillation.  He is on flecainide.  He had a coronary CTA that showed minimal coronary artery disease.  He was found to have an aortic aneurysm.  Today, denies symptoms of palpitations, chest pain, shortness of breath, orthopnea, PND, lower extremity edema, claudication, dizziness, presyncope, syncope, bleeding, or neurologic sequela. The patient is tolerating medications without difficulties.    Past Medical History:  Diagnosis Date   Atrial fibrillation ([place])    Barrett's esophagus    Cataract    surgery to remove bilateral   Chest pain    in feb 2020/ saw cardiologist- no heart attack!   CKD (chronic kidney disease) stage 3, GFR 30-59 ml/min (HCC)    GERD (gastroesophageal reflux disease)    Hx of hiatal hernia    Hyperlipidemia    Hypertension    Skin cancer    squamous cell of face/arm/legs   Substance abuse ([place])    stopped drinking in 1993   Past Surgical History:  Procedure Laterality Date   CATARACT EXTRACTION, BILATERAL  2012   COLONOSCOPY     KNEE ARTHROSCOPY Left 2010   torn meniscus   SKIN CANCER EXCISION  2015, 2012   face, arms and legs   UPPER GASTROINTESTINAL ENDOSCOPY     barrett's esophagus   WISDOM TOOTH EXTRACTION  1980   WISDOM TOOTH EXTRACTION       Current Outpatient Medications  Medication Sig Dispense Refill   acetaminophen (TYLENOL) 325 MG tablet Take 650 mg by mouth  every 6 (six) hours as needed for moderate pain or headache.     amLODipine (NORVASC) 5 MG tablet Take 1 tablet (5 mg total) by mouth daily. 90 tablet 3   benazepril (LOTENSIN) 40 MG tablet TAKE 1 TABLET DAILY 90 tablet 3   ELIQUIS 2.5 MG TABS tablet TAKE 1 TABLET TWICE A DAY 180 tablet 3   finasteride (PROSCAR) 5 MG tablet TAKE 1 TABLET DAILY (NEED OFFICE VISIT) 90 tablet 3   flecainide (TAMBOCOR) 100 MG tablet TAKE 1 TABLET TWICE A DAY 180 tablet 3   metoprolol succinate (TOPROL XL) 25 MG 24 hr tablet Take 1 tablet (25 mg total) by mouth daily. 90 tablet 3   Multiple Vitamins-Minerals (CENTRUM SILVER PO) Take 1 tablet by mouth daily.     nitroGLYCERIN (NITROSTAT) 0.4 MG SL tablet Place 1 tablet (0.4 mg total) under the tongue every 5 (five) minutes as needed for chest pain. 25 tablet 3   pantoprazole (PROTONIX) 40 MG tablet TAKE 1 TABLET DAILY 90 tablet 3   rosuvastatin (CRESTOR) 20 MG tablet TAKE 1 TABLET DAILY (Tykeisha Peer NEED TO SCHEDULE AN OFFICE VISIT FOR FURTHER REFILLS) 90 tablet 3   No current facility-administered medications for this visit.    Allergies:   Diltiazem   Social History:  The patient  reports that he quit smoking about 55 years ago. His smoking use included cigarettes. He has never used smokeless tobacco.  He reports that he does not drink alcohol and does not use drugs.   Family History:  The patient's family history includes Esophageal cancer in his mother; Heart disease in his brother; Kidney cancer in his sister; Kidney disease in his brother; Other in his brother; Stroke in his father.   ROS:  Please see the history of present illness.   Otherwise, review of systems is positive for none.   All other systems are reviewed and negative.   PHYSICAL EXAM: VS:  BP 114/67   Pulse (!) 50   Ht 6' (1.829 m)   Wt 203 lb (92.1 kg)   SpO2 97%   BMI 27.53 kg/m  , BMI Body mass index is 27.53 kg/m. GEN: Well nourished, well developed, in no acute distress  HEENT: normal   Neck: no JVD, carotid bruits, or masses Cardiac: RRR; no murmurs, rubs, or gallops,no edema  Respiratory:  clear to auscultation bilaterally, normal work of breathing GI: soft, nontender, nondistended, + BS MS: no deformity or atrophy  Skin: warm and dry Neuro:  Strength and sensation are intact Psych: euthymic mood, full affect  EKG:  EKG is ordered today. Personal review of the ekg ordered  shows sinus rhythm, rate 57  Recent Labs: 10/06/2022: Hemoglobin 14.8; Platelets 197 10/26/2022: ALT 18; BUN 15; Creat 1.33; Potassium 4.6; Sodium 139    Lipid Panel     Component Value Date/Time   CHOL 119 10/06/2022 0804   TRIG 149 10/06/2022 0804   HDL 42 10/06/2022 0804   CHOLHDL 2.8 10/06/2022 0804   VLDL 42 (H) 08/20/2016 1105   LDLCALC 54 10/06/2022 0804     Wt Readings from Last 3 Encounters:  11/11/22 203 lb (92.1 kg)  10/12/22 202 lb (91.6 kg)  04/08/22 202 lb 3.2 oz (91.7 kg)      Other studies Reviewed: Additional studies/ records that were reviewed today include: Epic notes   Holter monitor 01/05/18-personally reviewed Minimum heart rate 41 bpm at 2:25 AM Heart rate 144 bpm 6:05 PM Average heart rate 54 bpm Sinus rhythm with APCs and PVCs Wide-complex tachycardia, irregular, likely atrial fibrillation  ASSESSMENT AND PLAN:  1.  Paroxysmal atrial fibrillation: Currently on flecainide 100 mg twice daily, carvedilol 6.25 mg twice daily, Eliquis 2.5 mg daily.  CHA2DS2-VASc of at least 3.  Has remained in sinus rhythm.  No changes.  2.  Hypertension: Currently well-controlled  3.  Hyperlipidemia: Continue Crestor 20 mg daily per primary physician  4.  Secondary hypercoagulable state: Currently on Eliquis for atrial fibrillation as above  5.  High risk medication monitoring: Currently on flecainide for atrial fibrillation as above.  QRS remains narrow.  6.  Aortic aneurysm: Found on CTA.  Plan for yearly CT scan.  Current medicines are reviewed at length with  the patient today.   The patient does not have concerns regarding his medicines.  The following changes were made today: None  Labs/ tests ordered today include:  Orders Placed This Encounter  Procedures   EKG 12-Lead      Disposition:   FU 6 months  Signed, Xavius Spadafore Meredith Leeds, MD  11/11/2022 2:19 PM     [facility] [address] [place]  [address] [phone] (office) [phone] (fax)

## 2022-11-29 ENCOUNTER — Other Ambulatory Visit: Payer: Self-pay | Admitting: Family Medicine

## 2023-01-17 ENCOUNTER — Other Ambulatory Visit: Payer: Self-pay | Admitting: Cardiology

## 2023-01-18 ENCOUNTER — Other Ambulatory Visit: Payer: Self-pay | Admitting: Family Medicine

## 2023-01-19 NOTE — Telephone Encounter (Signed)
Requested Prescriptions  Pending Prescriptions Disp Refills   pantoprazole (PROTONIX) 40 MG tablet [Pharmacy Med Name: PANTOPRAZOLE SODIUM DR TABS 40MG] 90 tablet 3    Sig: TAKE 1 TABLET DAILY     Gastroenterology: Proton Pump Inhibitors Passed - 01/18/2023  6:35 AM      Passed - Valid encounter within last 12 months    Recent Outpatient Visits           9 months ago Constipation, unspecified constipation type   [place] Pickard, Cammie Mcgee, MD   1 year ago General medical exam   [place] [name], MD   2 years ago General medical exam   [place], Warren T, MD   3 years ago Drop in hemoglobin   [place], Cammie Mcgee, MD   3 years ago Orthostatic hypotension   [place] Pickard, Cammie Mcgee, MD

## 2023-02-28 DIAGNOSIS — L309 Dermatitis, unspecified: Secondary | ICD-10-CM | POA: Diagnosis not present

## 2023-04-15 DIAGNOSIS — L578 Other skin changes due to chronic exposure to nonionizing radiation: Secondary | ICD-10-CM | POA: Diagnosis not present

## 2023-04-15 DIAGNOSIS — L570 Actinic keratosis: Secondary | ICD-10-CM | POA: Diagnosis not present

## 2023-04-15 DIAGNOSIS — L720 Epidermal cyst: Secondary | ICD-10-CM | POA: Diagnosis not present

## 2023-04-15 DIAGNOSIS — D225 Melanocytic nevi of trunk: Secondary | ICD-10-CM | POA: Diagnosis not present

## 2023-04-15 DIAGNOSIS — D485 Neoplasm of uncertain behavior of skin: Secondary | ICD-10-CM | POA: Diagnosis not present

## 2023-04-15 DIAGNOSIS — Z85828 Personal history of other malignant neoplasm of skin: Secondary | ICD-10-CM | POA: Diagnosis not present

## 2023-04-15 DIAGNOSIS — C44622 Squamous cell carcinoma of skin of right upper limb, including shoulder: Secondary | ICD-10-CM | POA: Diagnosis not present

## 2023-04-15 DIAGNOSIS — L821 Other seborrheic keratosis: Secondary | ICD-10-CM | POA: Diagnosis not present

## 2023-04-18 ENCOUNTER — Telehealth: Payer: Self-pay | Admitting: Cardiology

## 2023-04-18 MED ORDER — NITROGLYCERIN 0.4 MG SL SUBL: 0.4000 mg | SUBLINGUAL_TABLET | SUBLINGUAL | 3 refills | Status: AC | PRN

## 2023-04-18 NOTE — Telephone Encounter (Signed)
Spoke with pt who complains of intermittent CP over the past week at night only when lying down.  Pt states last episode was Saturday night and he took a nitro tablet which did resolve the pain.  Pt states he does not eat past 7pm and goes to bed around 10pm.  BP last night 126/60 HR - 50.  Pt's cardiac CT 2023 showed non-obstructive CAD.  Pt also reports separate episodes of visual "flashes" that lasts for about 10-15 seconds.  He does not consider this blurred vision.  He states he does have an appointment with optometrist/ophthalmologist on 04/27/2023 for further evaluation.  Pt is due for f/u appointment with Dr Elberta Fortis. Will forward to Dr Elberta Fortis and his RN for further review and recommendation.  Pt advised if he develops chest pain again he should be evaluated further in the ED.  Refilled pt's nitro as last filled in 2020.  Pt states has been taking his wife's Rx of nitro.  Pt verbalizes understanding and agrees with current plan.

## 2023-04-18 NOTE — Telephone Encounter (Signed)
Pt c/o of Chest Pain: STAT if CP now or developed within 24 hours  1. Are you having CP right now? No  2. Are you experiencing any other symptoms (ex. SOB, nausea, vomiting, sweating)? No  3. How long have you been experiencing CP? 4 out of 6 nights  4. Is your CP continuous or coming and going? Coming and going   5. Have you taken Nitroglycerin? Yes  Patient stated he has been having chest pain when he lays down at night. Last night he took a nitroglycerin for the pain because of how much it had started to hurt. Patient stated that he has also had moments where his eyes are "flashing" and the left eye looks like he is looking through a moving screen for about 10-15 seconds. Patient stated that when this happens with his eyes, it is not while he is having chest pains. Please advise.

## 2023-04-21 ENCOUNTER — Ambulatory Visit (INDEPENDENT_AMBULATORY_CARE_PROVIDER_SITE_OTHER): Payer: Medicare Other | Admitting: Family Medicine

## 2023-04-21 ENCOUNTER — Encounter: Payer: Self-pay | Admitting: Family Medicine

## 2023-04-21 VITALS — BP 120/78 | HR 50 | Ht 72.0 in | Wt 204.4 lb

## 2023-04-21 DIAGNOSIS — R0789 Other chest pain: Secondary | ICD-10-CM | POA: Diagnosis not present

## 2023-04-21 MED ORDER — SUCRALFATE 1 G PO TABS: 1.0000 g | ORAL_TABLET | Freq: Three times a day (TID) | ORAL | 1 refills | Status: AC

## 2023-04-21 MED ORDER — PANTOPRAZOLE SODIUM 40 MG PO TBEC: 40.0000 mg | DELAYED_RELEASE_TABLET | Freq: Two times a day (BID) | ORAL | 3 refills | Status: DC

## 2023-04-21 NOTE — Progress Notes (Signed)
Subjective:    Patient ID: David Bush, male    DOB: [birth year], 89 y.o.   MRN: [id number]  Abdominal Pain  Eye Problem    Patient states that he has had pain in his chest 6 times over the last few weeks.  It occurs when he lays down at night.  He will feel an intense pain below his sternum.  It will ache and throb.  There is no short breath.  Reasonably as long.  No nausea or vomiting.  During the day when he sits up he is pain-free.  He denies any chest pain with activity.  He denies any shortness of breath or dyspnea on exertion.  EKG today shows sinus bradycardia with nonspecific ST changes in lead I and aVL.  This is a chronic finding for this patient.  The patient had a coronary artery calcium score last year that showed a score of 231 in the left anterior descending artery but was otherwise clear.  Therefore it was thought that he had mild nonobstructive plaque less than a year ago in his left anterior descending coronary artery.  He also has a history of a hiatal hernia and is taking Protonix 40 mg a day.  Food seems to exacerbate the pain   Past Medical History:  Diagnosis Date   Atrial fibrillation (HCC)    Barrett's esophagus    Cataract    surgery to remove bilateral   Chest pain    in feb 2020/ saw cardiologist- no heart attack!   CKD (chronic kidney disease) stage 3, GFR 30-59 ml/min (HCC)    GERD (gastroesophageal reflux disease)    Hx of hiatal hernia    Hyperlipidemia    Hypertension    Skin cancer    squamous cell of face/arm/legs   Substance abuse (HCC)    stopped drinking in 1993   Past Surgical History:  Procedure Laterality Date   CATARACT EXTRACTION, BILATERAL  2012   COLONOSCOPY     KNEE ARTHROSCOPY Left 2010   torn meniscus   SKIN CANCER EXCISION  2015, 2012   face, arms and legs   UPPER GASTROINTESTINAL ENDOSCOPY     barrett's esophagus   WISDOM TOOTH EXTRACTION  1980   WISDOM TOOTH EXTRACTION     Current Outpatient Medications on File  Prior to Visit  Medication Sig Dispense Refill   acetaminophen (TYLENOL) 325 MG tablet Take 650 mg by mouth every 6 (six) hours as needed for moderate pain or headache.     amLODipine (NORVASC) 5 MG tablet Take 1 tablet (5 mg total) by mouth daily. 90 tablet 3   benazepril (LOTENSIN) 40 MG tablet TAKE 1 TABLET DAILY 90 tablet 3   ELIQUIS 2.5 MG TABS tablet TAKE 1 TABLET TWICE A DAY 180 tablet 3   finasteride (PROSCAR) 5 MG tablet TAKE 1 TABLET DAILY (NEED OFFICE VISIT) 90 tablet 3   flecainide (TAMBOCOR) 100 MG tablet TAKE 1 TABLET TWICE A DAY 180 tablet 3   metoprolol succinate (TOPROL-XL) 25 MG 24 hr tablet TAKE 1 TABLET DAILY 90 tablet 3   Multiple Vitamins-Minerals (CENTRUM SILVER PO) Take 1 tablet by mouth daily.     nitroGLYCERIN (NITROSTAT) 0.4 MG SL tablet Place 1 tablet (0.4 mg total) under the tongue every 5 (five) minutes as needed for chest pain. 25 tablet 3   pantoprazole (PROTONIX) 40 MG tablet TAKE 1 TABLET DAILY 90 tablet 3   rosuvastatin (CRESTOR) 20 MG tablet TAKE 1 TABLET DAILY (WILL NEED  TO SCHEDULE AN OFFICE VISIT FOR FURTHER REFILLS) 90 tablet 3   No current facility-administered medications on file prior to visit.   Allergies  Allergen Reactions   Diltiazem Itching and Rash   Social History   Socioeconomic History   Marital status: Married    Spouse name: Not on file   Number of children: 1   Years of education: Not on file   Highest education level: Not on file  Occupational History   Not on file  Tobacco Use   Smoking status: Former    Years: 16    Types: Cigarettes    Quit date: 12/06/1966    Years since quitting: 56.4   Smokeless tobacco: Never  Vaping Use   Vaping Use: Never used  Substance and Sexual Activity   Alcohol use: No    Alcohol/week: 0.0 standard drinks of alcohol    Comment: 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   Drug use: No   Sexual activity: Not on file  Other Topics Concern   Not on file  Social History Narrative    Not on file   Social Determinants of Health   Financial Resource Strain: Not on file  Food Insecurity: Not on file  Transportation Needs: Not on file  Physical Activity: Not on file  Stress: Not on file  Social Connections: Not on file  Intimate Partner Violence: Not on file   Family History  Problem Relation Age of Onset   Esophageal cancer Mother    Stroke Father    Kidney cancer Sister    Kidney disease Brother    Other Brother    Heart disease Brother    Colon cancer Neg Hx    Colon polyps Neg Hx    Rectal cancer Neg Hx    Stomach cancer Neg Hx       Review of Systems  Gastrointestinal:  Positive for abdominal pain.  All other systems reviewed and are negative.      Objective:   Physical Exam Vitals reviewed.  Constitutional:      General: He is not in acute distress.    Appearance: He is well-developed. He is not diaphoretic.  HENT:     Head: Normocephalic and atraumatic.     Right Ear: External ear normal.     Left Ear: External ear normal.     Nose: Nose normal.     Mouth/Throat:     Pharynx: No oropharyngeal exudate.  Eyes:     General: No scleral icterus.       Right eye: No discharge.        Left eye: No discharge.     Conjunctiva/sclera: Conjunctivae normal.     Pupils: Pupils are equal, round, and reactive to light.  Neck:     Thyroid: No thyromegaly.     Vascular: No JVD.     Trachea: No tracheal deviation.  Cardiovascular:     Rate and Rhythm: Normal rate and regular rhythm.     Heart sounds: Murmur heard.     No friction rub. No gallop.  Pulmonary:     Effort: Pulmonary effort is normal. No respiratory distress.     Breath sounds: Normal breath sounds. No stridor. No wheezing or rales.  Chest:     Chest wall: No tenderness.  Abdominal:     General: Bowel sounds are normal. There is no distension.     Palpations: Abdomen is soft. There is no mass.     Tenderness: There  is no abdominal tenderness. There is no guarding or rebound.   Genitourinary:    Penis: Normal.      Prostate: Normal.     Rectum: Normal.  Musculoskeletal:        General: No deformity.     Cervical back: Normal range of motion and neck supple.  Lymphadenopathy:     Cervical: No cervical adenopathy.  Skin:    General: Skin is warm.     Coloration: Skin is not pale.     Findings: No erythema or rash.  Neurological:     Mental Status: He is alert and oriented to person, place, and time.     Cranial Nerves: No cranial nerve deficit.     Motor: No abnormal muscle tone.     Coordination: Coordination normal.     Deep Tendon Reflexes: Reflexes are normal and symmetric.  Psychiatric:        Behavior: Behavior normal.        Thought Content: Thought content normal.        Judgment: Judgment normal.           Assessment & Plan:  Other chest pain - Plan: EKG 12-Lead Based on his description, I feel that this is more likely related to the hiatal hernia given its persistently, lack of association with activity, the lack of any shortness of breath, and his coronary artery calcium score from last year.  Therefore morning increase his Protonix to 40 mg twice a day and add sucralfate 1 g p.o. q. ACH S.  I recommended that he elevate the head of his bed 2 inches I will recheck the patient next week.  If he develops intense pain he is to go to the hospital.  Also on the differential diagnosis aside from cardiac issues would be potential gallstones however this seems to be more midline.  Of note, the patient states that twice he has had sudden change in his vision.  He reports seeing zigzag lines in his vision.  He describes it as though the image she is looking at is a puzzle and suddenly the puzzle with a break like shattered glass.  This lasts for a few seconds and then spontaneously resolves.  He denies any vision loss.  He denies any blurry vision.  He denies any diplopia.  He has an appointment next week to see his ophthalmologist.  I suspect that these  are ocular migraines.  It tends to occur in both eyes simultaneously therefore I do not suspect that this is a retinal tear.  This also does not sound like amaurosis fugax or transient binocular vision loss associated with vertebrobasilar insufficiency.

## 2023-04-22 NOTE — Telephone Encounter (Signed)
Pt reports he went to PCP this week and his PCP does not think it is heart related CP. They feel it is acid reflux or his hiatal hernia. They are starting him on medication for reflux. Pt will let office know if cardiology needs to address further.  Aware forwarding to scheduler to arrange follow up in June/July per recall. Pt aware someone will call to arrange routine follow up Pt agreeable to plan.

## 2023-04-27 DIAGNOSIS — H01024 Squamous blepharitis left upper eyelid: Secondary | ICD-10-CM | POA: Diagnosis not present

## 2023-04-27 DIAGNOSIS — H01021 Squamous blepharitis right upper eyelid: Secondary | ICD-10-CM | POA: Diagnosis not present

## 2023-04-27 DIAGNOSIS — H43813 Vitreous degeneration, bilateral: Secondary | ICD-10-CM | POA: Diagnosis not present

## 2023-04-27 DIAGNOSIS — H35033 Hypertensive retinopathy, bilateral: Secondary | ICD-10-CM | POA: Diagnosis not present

## 2023-04-28 ENCOUNTER — Ambulatory Visit: Payer: Medicare Other | Admitting: Family Medicine

## 2023-04-29 ENCOUNTER — Ambulatory Visit (INDEPENDENT_AMBULATORY_CARE_PROVIDER_SITE_OTHER): Payer: Medicare Other | Admitting: Family Medicine

## 2023-04-29 VITALS — BP 102/52 | HR 55 | Temp 97.5°F | Ht 72.0 in | Wt 206.0 lb

## 2023-04-29 DIAGNOSIS — R0789 Other chest pain: Secondary | ICD-10-CM

## 2023-04-29 DIAGNOSIS — H5330 Unspecified disorder of binocular vision: Secondary | ICD-10-CM | POA: Diagnosis not present

## 2023-04-29 DIAGNOSIS — N400 Enlarged prostate without lower urinary tract symptoms: Secondary | ICD-10-CM | POA: Insufficient documentation

## 2023-04-29 MED ORDER — PANTOPRAZOLE SODIUM 40 MG PO TBEC: 40.0000 mg | DELAYED_RELEASE_TABLET | Freq: Two times a day (BID) | ORAL | 3 refills | Status: DC

## 2023-04-29 NOTE — Progress Notes (Signed)
With  Subjective:    Patient ID: David Bush, male    DOB: 1934-05-11, 89 y.o.   MRN: [id number]  Abdominal Pain  Eye Problem   04/21/23 Patient states that he has had pain in his chest 6 times over the last few weeks.  It occurs when he lays down at night.  He will feel an intense pain below his sternum.  It will ache and throb.  There is no short breath.  Reasonably as long.  No nausea or vomiting.  During the day when he sits up he is pain-free.  He denies any chest pain with activity.  He denies any shortness of breath or dyspnea on exertion.  EKG today shows sinus bradycardia with nonspecific ST changes in lead I and aVL.  This is a chronic finding for this patient.  The patient had a coronary artery calcium score last year that showed a score of 231 in the left anterior descending artery but was otherwise clear.  Therefore it was thought that he had mild nonobstructive plaque less than a year ago in his left anterior descending coronary artery.  He also has a history of a hiatal hernia and is taking Protonix 40 mg a day.  Food seems to exacerbate the pain.  At that time, my plan was: Based on his description, I feel that this is more likely related to the hiatal hernia given its persistently, lack of association with activity, the lack of any shortness of breath, and his coronary artery calcium score from last year.  Therefore morning increase his Protonix to 40 mg twice a day and add sucralfate 1 g p.o. q. ACH S.  I recommended that he elevate the head of his bed 2 inches I will recheck the patient next week.  If he develops intense pain he is to go to the hospital.  Also on the differential diagnosis aside from cardiac issues would be potential gallstones however this seems to be more midline.  Of note, the patient states that twice he has had sudden change in his vision.  He reports seeing zigzag lines in his vision.  He describes it as though the image she is looking at is a puzzle and  suddenly the puzzle with a break like shattered glass.  This lasts for a few seconds and then spontaneously resolves.  He denies any vision loss.  He denies any blurry vision.  He denies any diplopia.  He has an appointment next week to see his ophthalmologist.  I suspect that these are ocular migraines.  It tends to occur in both eyes simultaneously therefore I do not suspect that this is a retinal tear.  This also does not sound like amaurosis fugax or transient binocular vision loss associated with vertebrobasilar insufficiency.  04/29/23 Patient states that the chest pain has completely stopped after increasing pantoprazole to 40 mg twice a day and elevating the head of his bed.  He denies any further pain in his chest.  He denies any shortness of breath.  He saw his ophthalmologist who saw no concerning findings on his retinal exam.  I believe that he is likely having ocular migraines.  He denies any further attacks.  Specifically he denies any dizziness slurred speech or neurologic deficits.     Past Medical History:  Diagnosis Date   Atrial fibrillation (HCC)    Barrett's esophagus    Cataract    surgery to remove bilateral   Chest pain    in feb  2020/ saw cardiologist- no heart attack!   CKD (chronic kidney disease) stage 3, GFR 30-59 ml/min (HCC)    GERD (gastroesophageal reflux disease)    Hx of hiatal hernia    Hyperlipidemia    Hypertension    Skin cancer    squamous cell of face/arm/legs   Substance abuse (HCC)    stopped drinking in 1993   Past Surgical History:  Procedure Laterality Date   CATARACT EXTRACTION, BILATERAL  2012   COLONOSCOPY     KNEE ARTHROSCOPY Left 2010   torn meniscus   SKIN CANCER EXCISION  2015, 2012   face, arms and legs   UPPER GASTROINTESTINAL ENDOSCOPY     barrett's esophagus   WISDOM TOOTH EXTRACTION  1980   WISDOM TOOTH EXTRACTION     Current Outpatient Medications on File Prior to Visit  Medication Sig Dispense Refill   acetaminophen  (TYLENOL) 325 MG tablet Take 650 mg by mouth every 6 (six) hours as needed for moderate pain or headache.     amLODipine (NORVASC) 5 MG tablet Take 1 tablet (5 mg total) by mouth daily. 90 tablet 3   benazepril (LOTENSIN) 40 MG tablet TAKE 1 TABLET DAILY 90 tablet 3   ELIQUIS 2.5 MG TABS tablet TAKE 1 TABLET TWICE A DAY 180 tablet 3   finasteride (PROSCAR) 5 MG tablet TAKE 1 TABLET DAILY (NEED OFFICE VISIT) 90 tablet 3   flecainide (TAMBOCOR) 100 MG tablet TAKE 1 TABLET TWICE A DAY 180 tablet 3   metoprolol succinate (TOPROL-XL) 25 MG 24 hr tablet TAKE 1 TABLET DAILY 90 tablet 3   Multiple Vitamins-Minerals (CENTRUM SILVER PO) Take 1 tablet by mouth daily.     nitroGLYCERIN (NITROSTAT) 0.4 MG SL tablet Place 1 tablet (0.4 mg total) under the tongue every 5 (five) minutes as needed for chest pain. 25 tablet 3   pantoprazole (PROTONIX) 40 MG tablet TAKE 1 TABLET DAILY 90 tablet 3   pantoprazole (PROTONIX) 40 MG tablet Take 1 tablet (40 mg total) by mouth 2 (two) times daily. 60 tablet 3   rosuvastatin (CRESTOR) 20 MG tablet TAKE 1 TABLET DAILY (WILL NEED TO SCHEDULE AN OFFICE VISIT FOR FURTHER REFILLS) 90 tablet 3   sucralfate (CARAFATE) 1 g tablet Take 1 tablet (1 g total) by mouth 4 (four) times daily -  with meals and at bedtime. 120 tablet 1   No current facility-administered medications on file prior to visit.   Allergies  Allergen Reactions   Diltiazem Itching and Rash   Social History   Socioeconomic History   Marital status: Married    Spouse name: Not on file   Number of children: 1   Years of education: Not on file   Highest education level: Not on file  Occupational History   Not on file  Tobacco Use   Smoking status: Former    Years: 16    Types: Cigarettes    Quit date: 12/06/1966    Years since quitting: 56.4   Smokeless tobacco: Never  Vaping Use   Vaping Use: Never used  Substance and Sexual Activity   Alcohol use: No    Alcohol/week: 0.0 standard drinks of  alcohol    Comment: 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   Drug use: No   Sexual activity: Not on file  Other Topics Concern   Not on file  Social History Narrative   Not on file   Social Determinants of Health   Financial Resource Strain: Not  on file  Food Insecurity: Not on file  Transportation Needs: Not on file  Physical Activity: Not on file  Stress: Not on file  Social Connections: Not on file  Intimate Partner Violence: Not on file   Family History  Problem Relation Age of Onset   Esophageal cancer Mother    Stroke Father    Kidney cancer Sister    Kidney disease Brother    Other Brother    Heart disease Brother    Colon cancer Neg Hx    Colon polyps Neg Hx    Rectal cancer Neg Hx    Stomach cancer Neg Hx       Review of Systems  Gastrointestinal:  Positive for abdominal pain.  All other systems reviewed and are negative.      Objective:   Physical Exam Vitals reviewed.  Constitutional:      General: He is not in acute distress.    Appearance: He is well-developed. He is not diaphoretic.  HENT:     Head: Normocephalic and atraumatic.     Right Ear: External ear normal.     Left Ear: External ear normal.     Nose: Nose normal.     Mouth/Throat:     Pharynx: No oropharyngeal exudate.  Eyes:     General: No scleral icterus.       Right eye: No discharge.        Left eye: No discharge.     Conjunctiva/sclera: Conjunctivae normal.     Pupils: Pupils are equal, round, and reactive to light.  Neck:     Thyroid: No thyromegaly.     Vascular: No JVD.     Trachea: No tracheal deviation.  Cardiovascular:     Rate and Rhythm: Normal rate and regular rhythm.     Heart sounds: Murmur heard.     No friction rub. No gallop.  Pulmonary:     Effort: Pulmonary effort is normal. No respiratory distress.     Breath sounds: Normal breath sounds. No stridor. No wheezing or rales.  Chest:     Chest wall: No tenderness.  Abdominal:     General: Bowel  sounds are normal. There is no distension.     Palpations: Abdomen is soft. There is no mass.     Tenderness: There is no abdominal tenderness. There is no guarding or rebound.  Genitourinary:    Penis: Normal.      Prostate: Normal.     Rectum: Normal.  Musculoskeletal:        General: No deformity.     Cervical back: Normal range of motion and neck supple.  Lymphadenopathy:     Cervical: No cervical adenopathy.  Skin:    General: Skin is warm.     Coloration: Skin is not pale.     Findings: No erythema or rash.  Neurological:     Mental Status: He is alert and oriented to person, place, and time.     Cranial Nerves: No cranial nerve deficit.     Motor: No abnormal muscle tone.     Coordination: Coordination normal.     Deep Tendon Reflexes: Reflexes are normal and symmetric.  Psychiatric:        Behavior: Behavior normal.        Thought Content: Thought content normal.        Judgment: Judgment normal.           Assessment & Plan:  Binocular vision disorder - Plan: US Carotid  Duplex Bilateral  Other chest pain I am very happy that the patient with chest pain.  I believe that this proves that this was GI related.  Continue pantoprazole 40 mg twice a day.  I believe we can discontinue sucralfate and simply continue to have the head of his bed elevated.  We may have a ocular migraines.  Given the fact that it is bilateral, I am not concerned about embolic stroke to either eye as a potential cause.  I will check carotid Dopplers to evaluate for any retrograde flow in the vertebral system to suggest vertebrobasilar insufficiency

## 2023-05-05 ENCOUNTER — Ambulatory Visit (INDEPENDENT_AMBULATORY_CARE_PROVIDER_SITE_OTHER): Payer: Medicare Other | Admitting: Family Medicine

## 2023-05-05 ENCOUNTER — Encounter: Payer: Self-pay | Admitting: Family Medicine

## 2023-05-05 VITALS — BP 130/50 | HR 48 | Temp 97.7°F | Ht 73.0 in | Wt 208.0 lb

## 2023-05-05 DIAGNOSIS — J069 Acute upper respiratory infection, unspecified: Secondary | ICD-10-CM | POA: Diagnosis not present

## 2023-05-05 NOTE — Patient Instructions (Addendum)
Your symptoms and exam findings are most consistent with a viral upper respiratory infection. These usually run their course in 5-7 days. Unfortunately, antibiotics don't work against viruses and just increase your risk of other issues such as diarrhea, yeast infections, and resistant infections.  If your symptoms last longer than 10 days and/or you start feeling worse with facial pain, high fever, cough, shortness of breath or start feeling significantly worse, please call us right away to be further evaluated.  Some things that can make you feel better are: - Increased rest - Increasing fluid with water/sugar free electrolytes - Acetaminophen as needed for fever/pain.  - Salt water gargling, chloraseptic spray and throat lozenges - OTC guaifenesin (Mucinex).  - Saline sinus flushes or a neti pot.  - Humidifying the air. - Delsym for cough 

## 2023-05-05 NOTE — Progress Notes (Signed)
   Acute Office Visit  Subjective:     Patient ID: David Bush, male    DOB: 1934-05-06, 89 y.o.   MRN: [id number]  Chief Complaint  Patient presents with   Follow-up    scratchy throat, cough, hoarse - JBG\\\    HPI Patient is in today for 2 days of dry cough, scratchy throat, hoarseness, headache, runny nose. Denies fever, shortness of breath, wheezing, body aches, chills No known sick exposures, no home covid test Has tried nothing.  Review of Systems  All other systems reviewed and are negative.       Objective:    BP (!) 130/50   Pulse (!) 48   Temp 97.7 F (36.5 C) (Oral)   Ht 6\' 1"  (1.854 m)   Wt 208 lb (94.3 kg)   SpO2 98%   BMI 27.44 kg/m    Physical Exam Vitals and nursing note reviewed.  Constitutional:      Appearance: Normal appearance. He is normal weight.  HENT:     Head: Normocephalic and atraumatic.     Right Ear: Tympanic membrane, ear canal and external ear normal.     Left Ear: Tympanic membrane, ear canal and external ear normal.     Nose: Nose normal.     Mouth/Throat:     Mouth: Mucous membranes are moist.     Pharynx: Oropharynx is clear.  Eyes:     Conjunctiva/sclera: Conjunctivae normal.  Cardiovascular:     Rate and Rhythm: Regular rhythm. Bradycardia present.     Pulses: Normal pulses.     Heart sounds: Murmur heard.  Pulmonary:     Effort: Pulmonary effort is normal.     Breath sounds: Normal breath sounds.  Lymphadenopathy:     Cervical: No cervical adenopathy.  Skin:    General: Skin is warm and dry.     Capillary Refill: Capillary refill takes less than 2 seconds.  Neurological:     General: No focal deficit present.     Mental Status: He is alert and oriented to person, place, and time. Mental status is at baseline.  Psychiatric:        Mood and Affect: Mood normal.        Behavior: Behavior normal.        Thought Content: Thought content normal.        Judgment: Judgment normal.     No results found  for any visits on 05/05/23.      Assessment & Plan:   Problem List Items Addressed This Visit     Viral URI - Primary    Reassured patient that symptoms and exam findings are most consistent with a viral upper respiratory infection and explained lack of efficacy of antibiotics against viruses.  Discussed expected course and features suggestive of secondary bacterial infection.  Continue supportive care. Increase fluid intake with water or electrolyte solution like pedialyte. Encouraged acetaminophen as needed for fever/pain. Encouraged salt water gargling, chloraseptic spray and throat lozenges. Encouraged OTC guaifenesin. Encouraged saline sinus flushes and/or neti with humidified air.         No orders of the defined types were placed in this encounter.   Return if symptoms worsen or fail to improve.  Park Meo, FNP

## 2023-05-05 NOTE — Assessment & Plan Note (Signed)

## 2023-05-17 DIAGNOSIS — C44622 Squamous cell carcinoma of skin of right upper limb, including shoulder: Secondary | ICD-10-CM | POA: Diagnosis not present

## 2023-05-17 DIAGNOSIS — D0461 Carcinoma in situ of skin of right upper limb, including shoulder: Secondary | ICD-10-CM | POA: Diagnosis not present

## 2023-06-02 ENCOUNTER — Ambulatory Visit
Admission: RE | Admit: 2023-06-02 | Discharge: 2023-06-02 | Disposition: A | Discharge status: Home / Self Care | Payer: Medicare Other | Source: Ambulatory Visit | Attending: Family Medicine | Admitting: Family Medicine

## 2023-06-02 DIAGNOSIS — H5330 Unspecified disorder of binocular vision: Secondary | ICD-10-CM

## 2023-06-02 DIAGNOSIS — H538 Other visual disturbances: Secondary | ICD-10-CM | POA: Diagnosis not present

## 2023-06-07 DIAGNOSIS — C44622 Squamous cell carcinoma of skin of right upper limb, including shoulder: Secondary | ICD-10-CM | POA: Diagnosis not present

## 2023-06-26 ENCOUNTER — Other Ambulatory Visit: Payer: Self-pay | Admitting: Family Medicine

## 2023-07-01 ENCOUNTER — Ambulatory Visit: Payer: Medicare Other | Admitting: Cardiology

## 2023-07-06 ENCOUNTER — Telehealth: Payer: Self-pay | Admitting: Family Medicine

## 2023-07-06 DIAGNOSIS — C44529 Squamous cell carcinoma of skin of other part of trunk: Secondary | ICD-10-CM | POA: Diagnosis not present

## 2023-07-06 DIAGNOSIS — D485 Neoplasm of uncertain behavior of skin: Secondary | ICD-10-CM | POA: Diagnosis not present

## 2023-07-06 DIAGNOSIS — L570 Actinic keratosis: Secondary | ICD-10-CM | POA: Diagnosis not present

## 2023-07-06 DIAGNOSIS — C44622 Squamous cell carcinoma of skin of right upper limb, including shoulder: Secondary | ICD-10-CM | POA: Diagnosis not present

## 2023-07-06 NOTE — Telephone Encounter (Signed)
Patient stated he received a call from [facility]; stated they asked him to have his PCP inform them of the max day supply his plan allows (usually 90 days) for refill of finasteride (PROSCAR) 5 MG tablet   Pharmacy info:  [facility] - [place], [place] - [address], [place] [address] Phone: [phone]  Fax: [phone]   Please contact pharmacist.

## 2023-07-07 ENCOUNTER — Other Ambulatory Visit: Payer: Self-pay

## 2023-07-07 DIAGNOSIS — I10 Essential (primary) hypertension: Secondary | ICD-10-CM

## 2023-07-07 MED ORDER — FINASTERIDE 5 MG PO TABS
ORAL_TABLET | ORAL | 1 refills | Status: DC
Start: 2023-07-07 — End: 2024-01-03

## 2023-08-03 ENCOUNTER — Other Ambulatory Visit: Payer: Self-pay | Admitting: Cardiology

## 2023-08-03 DIAGNOSIS — I480 Paroxysmal atrial fibrillation: Secondary | ICD-10-CM

## 2023-08-16 DIAGNOSIS — C44622 Squamous cell carcinoma of skin of right upper limb, including shoulder: Secondary | ICD-10-CM | POA: Diagnosis not present

## 2023-08-22 ENCOUNTER — Other Ambulatory Visit: Payer: Self-pay | Admitting: Family Medicine

## 2023-08-22 DIAGNOSIS — I10 Essential (primary) hypertension: Secondary | ICD-10-CM

## 2023-08-23 DIAGNOSIS — L905 Scar conditions and fibrosis of skin: Secondary | ICD-10-CM | POA: Diagnosis not present

## 2023-08-23 DIAGNOSIS — C44529 Squamous cell carcinoma of skin of other part of trunk: Secondary | ICD-10-CM | POA: Diagnosis not present

## 2023-08-23 NOTE — Telephone Encounter (Signed)
Requested by [facility].  Future visit in 1 month.  Requested Prescriptions  Pending Prescriptions Disp Refills   amLODipine (NORVASC) 5 MG tablet [Pharmacy Med Name: AMLODIPINE BESYLATE TABS 5MG ] 90 tablet 0    Sig: TAKE 1 TABLET DAILY     Cardiovascular: Calcium Channel Blockers 2 Failed - 08/22/2023  3:28 AM      Failed - Valid encounter within last 6 months    Recent Outpatient Visits           1 year ago Constipation, unspecified constipation type   [facility] Pickard, Priscille Heidelberg, MD   1 year ago General medical exam   [facility] Donita Brooks, MD   2 years ago General medical exam   [facility] Donita Brooks, MD   3 years ago Drop in hemoglobin   [facility] Pickard, Priscille Heidelberg, MD   4 years ago Orthostatic hypotension   [facility] Pickard, Priscille Heidelberg, MD       Future Appointments             In 3 weeks Camnitz, Andree Coss, MD [facility]   In 1 month Pickard, Priscille Heidelberg, MD [facility], [facility]            Passed - Last BP in normal range    BP Readings from Last 1 Encounters:  05/05/23 (!) 130/50         Passed - Last Heart Rate in normal range    Pulse Readings from Last 1 Encounters:  05/05/23 (!) 48

## 2023-08-26 ENCOUNTER — Ambulatory Visit (INDEPENDENT_AMBULATORY_CARE_PROVIDER_SITE_OTHER): Payer: Medicare Other | Admitting: Family Medicine

## 2023-08-26 ENCOUNTER — Encounter: Payer: Self-pay | Admitting: Family Medicine

## 2023-08-26 VITALS — BP 124/68 | HR 60 | Temp 97.8°F | Ht 73.0 in | Wt 207.2 lb

## 2023-08-26 DIAGNOSIS — R1031 Right lower quadrant pain: Secondary | ICD-10-CM

## 2023-08-26 NOTE — Progress Notes (Signed)
With  Subjective:    Patient ID: David Bush, male    DOB: July 12, 1934, 89 y.o.   MRN: [id number]  Patient is an 89 year old Caucasian gentleman who presents with right lower quadrant abdominal pain.  He states that he is been dealing with the pain for the last 4 weeks.  He denies any fevers or chills.  He denies any melena or hematochezia.  He denies any nausea or vomiting.  Having a bowel movement does not exacerbate the pain or alleviate the pain.  Food does not exacerbate the pain or alleviate the pain.  There is no visible hernia.  There is no palpable abdominal wall defect.  I am unable to reproduce the pain with deep palpation into his abdomen.  Nothing makes the pain worse or better.  It is constant and has been a dull constant ache now for more than 4 weeks.  Past Medical History:  Diagnosis Date   Atrial fibrillation (HCC)    Barrett's esophagus    Cataract    surgery to remove bilateral   Chest pain    in feb 2020/ saw cardiologist- no heart attack!   CKD (chronic kidney disease) stage 3, GFR 30-59 ml/min (HCC)    GERD (gastroesophageal reflux disease)    Hx of hiatal hernia    Hyperlipidemia    Hypertension    Skin cancer    squamous cell of face/arm/legs   Substance abuse (HCC)    stopped drinking in 1993   Past Surgical History:  Procedure Laterality Date   CATARACT EXTRACTION, BILATERAL  2012   COLONOSCOPY     KNEE ARTHROSCOPY Left 2010   torn meniscus   SKIN CANCER EXCISION  2015, 2012   face, arms and legs   UPPER GASTROINTESTINAL ENDOSCOPY     barrett's esophagus   WISDOM TOOTH EXTRACTION  1980   WISDOM TOOTH EXTRACTION     Current Outpatient Medications on File Prior to Visit  Medication Sig Dispense Refill   acetaminophen (TYLENOL) 325 MG tablet Take 650 mg by mouth every 6 (six) hours as needed for moderate pain or headache.     amLODipine (NORVASC) 5 MG tablet TAKE 1 TABLET DAILY 90 tablet 0   benazepril (LOTENSIN) 40 MG tablet TAKE 1 TABLET DAILY  90 tablet 3   ELIQUIS 2.5 MG TABS tablet TAKE 1 TABLET TWICE A DAY 180 tablet 3   finasteride (PROSCAR) 5 MG tablet TAKE 1 TABLET DAILY. 90 tablet 1   flecainide (TAMBOCOR) 100 MG tablet TAKE 1 TABLET TWICE A DAY 180 tablet 1   metoprolol succinate (TOPROL-XL) 25 MG 24 hr tablet TAKE 1 TABLET DAILY 90 tablet 3   Multiple Vitamins-Minerals (CENTRUM SILVER PO) Take 1 tablet by mouth daily.     nitroGLYCERIN (NITROSTAT) 0.4 MG SL tablet Place 1 tablet (0.4 mg total) under the tongue every 5 (five) minutes as needed for chest pain. 25 tablet 3   pantoprazole (PROTONIX) 40 MG tablet TAKE 1 TABLET DAILY 90 tablet 3   pantoprazole (PROTONIX) 40 MG tablet Take 1 tablet (40 mg total) by mouth 2 (two) times daily. 180 tablet 3   rosuvastatin (CRESTOR) 20 MG tablet TAKE 1 TABLET DAILY (WILL NEED TO SCHEDULE AN OFFICE VISIT FOR FURTHER REFILLS) 90 tablet 3   sucralfate (CARAFATE) 1 g tablet Take 1 tablet (1 g total) by mouth 4 (four) times daily -  with meals and at bedtime. 120 tablet 1   No current facility-administered medications on file prior to visit.  Allergies  Allergen Reactions   Diltiazem Itching and Rash   Social History   Socioeconomic History   Marital status: Married    Spouse name: Not on file   Number of children: 1   Years of education: Not on file   Highest education level: Not on file  Occupational History   Not on file  Tobacco Use   Smoking status: Former    Current packs/day: 0.00    Types: Cigarettes    Start date: 12/06/1950    Quit date: 12/06/1966    Years since quitting: 56.7   Smokeless tobacco: Never  Vaping Use   Vaping status: Never Used  Substance and Sexual Activity   Alcohol use: No    Alcohol/week: 0.0 standard drinks of alcohol    Comment: 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   Drug use: No   Sexual activity: Not on file  Other Topics Concern   Not on file  Social History Narrative   Not on file   Social Determinants of Health    Financial Resource Strain: Not on file  Food Insecurity: Not on file  Transportation Needs: Not on file  Physical Activity: Not on file  Stress: Not on file  Social Connections: Unknown (04/20/2022)   Received from [facility], [facility]   Social Network    Social Network: Not on file  Intimate Partner Violence: Unknown (03/12/2022)   Received from [facility], [facility]   HITS    Physically Hurt: Not on file    Insult or Talk Down To: Not on file    Threaten Physical Harm: Not on file    Scream or Curse: Not on file   Family History  Problem Relation Age of Onset   Esophageal cancer Mother    Stroke Father    Kidney cancer Sister    Kidney disease Brother    Other Brother    Heart disease Brother    Colon cancer Neg Hx    Colon polyps Neg Hx    Rectal cancer Neg Hx    Stomach cancer Neg Hx       Review of Systems  Gastrointestinal:  Positive for abdominal pain.  All other systems reviewed and are negative.      Objective:   Physical Exam Vitals reviewed.  Constitutional:      General: He is not in acute distress.    Appearance: He is well-developed. He is not diaphoretic.  HENT:     Head: Normocephalic and atraumatic.     Right Ear: External ear normal.     Left Ear: External ear normal.     Nose: Nose normal.     Mouth/Throat:     Pharynx: No oropharyngeal exudate.  Eyes:     General: No scleral icterus.       Right eye: No discharge.        Left eye: No discharge.     Conjunctiva/sclera: Conjunctivae normal.     Pupils: Pupils are equal, round, and reactive to light.  Neck:     Thyroid: No thyromegaly.     Vascular: No JVD.     Trachea: No tracheal deviation.  Cardiovascular:     Rate and Rhythm: Normal rate and regular rhythm.     Heart sounds: Murmur heard.     No friction rub. No gallop.  Pulmonary:     Effort: Pulmonary effort is normal. No respiratory distress.     Breath sounds: Normal breath sounds.  No stridor. No wheezing or  rales.  Chest:     Chest wall: No tenderness.  Abdominal:     General: Bowel sounds are normal. There is no distension.     Palpations: Abdomen is soft. There is no mass.     Tenderness: There is no abdominal tenderness. There is no guarding or rebound.    Genitourinary:    Penis: Normal.      Testes: Normal.        Right: Tenderness or swelling not present.        Left: Tenderness or swelling not present.     Prostate: Normal.     Rectum: Normal.  Musculoskeletal:        General: No deformity.     Cervical back: Normal range of motion and neck supple.  Lymphadenopathy:     Cervical: No cervical adenopathy.  Skin:    General: Skin is warm.     Coloration: Skin is not pale.     Findings: No erythema or rash.  Neurological:     Mental Status: He is alert and oriented to person, place, and time.     Cranial Nerves: No cranial nerve deficit.     Motor: No abnormal muscle tone.     Coordination: Coordination normal.     Deep Tendon Reflexes: Reflexes are normal and symmetric.  Psychiatric:        Behavior: Behavior normal.        Thought Content: Thought content normal.        Judgment: Judgment normal.           Assessment & Plan:  RLQ abdominal pain - Plan: CBC with Differential/Platelet, COMPLETE METABOLIC PANEL WITH GFR Patient is abdominal pain is uncertain.  His history does not provide any clues.  I am unable to reproduce the pain with deep palpation.  I resisted abdominal wall flexion and did not exacerbate the pain.  Twisting motions of the abdomen did not exacerbate the pain.  Therefore I believe we need to proceed with a CT scan of the abdomen and pelvis given his advanced age to evaluate for a mass.

## 2023-08-27 LAB — COMPLETE METABOLIC PANEL WITH GFR
AG Ratio: 1.9 (calc) (ref 1.0–2.5)
ALT: 13 U/L (ref 9–46)
AST: 16 U/L (ref 10–35)
Albumin: 4.2 g/dL (ref 3.6–5.1)
Alkaline phosphatase (APISO): 61 U/L (ref 35–144)
BUN/Creatinine Ratio: 15 (calc) (ref 6–22)
BUN: 21 mg/dL (ref 7–25)
CO2: 26 mmol/L (ref 20–32)
Calcium: 9.1 mg/dL (ref 8.6–10.3)
Chloride: 105 mmol/L (ref 98–110)
Creat: 1.42 mg/dL — ABNORMAL HIGH (ref 0.70–1.22)
Globulin: 2.2 g/dL (calc) (ref 1.9–3.7)
Glucose, Bld: 117 mg/dL — ABNORMAL HIGH (ref 65–99)
Potassium: 4.2 mmol/L (ref 3.5–5.3)
Sodium: 139 mmol/L (ref 135–146)
Total Bilirubin: 0.4 mg/dL (ref 0.2–1.2)
Total Protein: 6.4 g/dL (ref 6.1–8.1)
eGFR: 48 mL/min/{1.73_m2} — ABNORMAL LOW (ref >=60)

## 2023-08-27 LAB — CBC WITH DIFFERENTIAL/PLATELET
Absolute Monocytes: 600 cells/uL (ref 200–950)
Basophils Absolute: 53 cells/uL (ref 0–200)
Basophils Relative: 0.7 %
Eosinophils Absolute: 270 cells/uL (ref 15–500)
Eosinophils Relative: 3.6 %
HCT: 39.6 % (ref 38.5–50.0)
Hemoglobin: 13 g/dL — ABNORMAL LOW (ref 13.2–17.1)
Lymphs Abs: 1680 cells/uL (ref 850–3900)
MCH: 30.7 pg (ref 27.0–33.0)
MCHC: 32.8 g/dL (ref 32.0–36.0)
MCV: 93.4 fL (ref 80.0–100.0)
MPV: 10.3 fL (ref 7.5–12.5)
Monocytes Relative: 8 %
Neutro Abs: 4898 cells/uL (ref 1500–7800)
Neutrophils Relative %: 65.3 %
Platelets: 208 10*3/uL (ref 140–400)
RBC: 4.24 10*6/uL (ref 4.20–5.80)
RDW: 12.3 % (ref 11.0–15.0)
Total Lymphocyte: 22.4 %
WBC: 7.5 10*3/uL (ref 3.8–10.8)

## 2023-09-12 ENCOUNTER — Inpatient Hospital Stay
Admission: RE | Admit: 2023-09-12 | Discharge: 2023-09-12 | Discharge status: Home / Self Care | Payer: Medicare Other | Source: Ambulatory Visit | Attending: Family Medicine | Admitting: Family Medicine

## 2023-09-12 DIAGNOSIS — R1031 Right lower quadrant pain: Secondary | ICD-10-CM | POA: Diagnosis not present

## 2023-09-12 DIAGNOSIS — K5730 Diverticulosis of large intestine without perforation or abscess without bleeding: Secondary | ICD-10-CM | POA: Diagnosis not present

## 2023-09-12 MED ORDER — IOPAMIDOL (ISOVUE-300) INJECTION 61%
200.0000 mL | Freq: Once | INTRAVENOUS | Status: AC | PRN
  Administered 2023-09-12: 80 mL via INTRAVENOUS

## 2023-09-14 ENCOUNTER — Ambulatory Visit: Payer: Medicare Other | Attending: Cardiology | Admitting: Cardiology

## 2023-09-14 ENCOUNTER — Encounter: Payer: Self-pay | Admitting: Cardiology

## 2023-09-14 VITALS — BP 136/64 | HR 49 | Ht 72.0 in | Wt 208.6 lb

## 2023-09-14 DIAGNOSIS — I10 Essential (primary) hypertension: Secondary | ICD-10-CM | POA: Insufficient documentation

## 2023-09-14 DIAGNOSIS — D6869 Other thrombophilia: Secondary | ICD-10-CM | POA: Diagnosis not present

## 2023-09-14 DIAGNOSIS — I480 Paroxysmal atrial fibrillation: Secondary | ICD-10-CM | POA: Diagnosis not present

## 2023-09-14 DIAGNOSIS — I7121 Aneurysm of the ascending aorta, without rupture: Secondary | ICD-10-CM | POA: Diagnosis not present

## 2023-09-14 NOTE — Progress Notes (Signed)
Electrophysiology Office Note:   Date:  09/14/2023  ID:  David Bush, DOB 09/11/1934, MRN [id number]  Primary Cardiologist: None Electrophysiologist: Keeanna Villafranca Jorja Loa, MD      History of Present Illness:   David Bush is a 89 y.o. male with h/o Barrett's esophagus, CKD stage III, hypertension, hyperlipidemia, atrial fibrillation, aortic aneurysm seen today for routine electrophysiology followup.   Since last being seen in our clinic the patient reports doing overall well.  He has noted no episodes of atrial fibrillation.  He is able to do all of his daily activities without restriction.  He has noted that he has been slowing down some, having to take more breaks when he is exerting himself, but he attributes this to aging.  He did notice, a few months ago, that he was having changes in vision.  He did see his eye doctor and his primary physician, who found no acute issue.  he denies chest pain, palpitations, dyspnea, PND, orthopnea, nausea, vomiting, dizziness, syncope, edema, weight gain, or early satiety.   Review of systems complete and found to be negative unless listed in HPI.   EP Information / Studies Reviewed:    EKG is ordered today. Personal review as below.  EKG Interpretation Date/Time:  Wednesday September 14 2023 11:25:57 EDT Ventricular Rate:  49 PR Interval:  244 QRS Duration:  108 QT Interval:  472 QTC Calculation: 426 R Axis:   -11  Text Interpretation: Sinus bradycardia with 1st degree A-V block When compared with ECG of 07-Feb-2022 21:54, Sinus bradycardia Confirmed by Maison Agrusa ([id number]) on 09/14/2023 11:45:23 AM     Risk Assessment/Calculations:    CHA2DS2-VASc Score = 4   This indicates a 4.8% annual risk of stroke. The patient's score is based upon:   Physical Exam:   VS:  BP 136/64   Pulse (!) 49   Ht 6' (1.829 m)   Wt 208 lb 9.6 oz (94.6 kg)   SpO2 98%   BMI 28.29 kg/m    Wt Readings from Last 3 Encounters:  09/14/23 208 lb  9.6 oz (94.6 kg)  08/26/23 207 lb 3.2 oz (94 kg)  05/05/23 208 lb (94.3 kg)     GEN: Well nourished, well developed in no acute distress NECK: No JVD; No carotid bruits CARDIAC: Regular rate and rhythm, no murmurs, rubs, gallops RESPIRATORY:  Clear to auscultation without rales, wheezing or rhonchi  ABDOMEN: Soft, non-tender, non-distended EXTREMITIES:  No edema; No deformity   ASSESSMENT AND PLAN:    1.  Paroxysmal atrial fibrillation: Currently on flecainide and carvedilol.  In sinus rhythm.  Continue with current management.  2.  Hypertension: Currently well-controlled  3.  Hyperlipidemia: Continue Crestor per primary physician  4.  Secondary hypercoagulable state: Currently on Eliquis for atrial fibrillation  5.  Aortic aneurysm: Found on CTA.  Plan for repeat CTA.  Follow up with EP APP in 6 months  Signed, Emon Miggins Jorja Loa, MD

## 2023-09-21 ENCOUNTER — Other Ambulatory Visit: Payer: Self-pay | Admitting: *Deleted

## 2023-09-21 DIAGNOSIS — I480 Paroxysmal atrial fibrillation: Secondary | ICD-10-CM

## 2023-09-21 MED ORDER — APIXABAN 2.5 MG PO TABS
2.5000 mg | ORAL_TABLET | Freq: Two times a day (BID) | ORAL | 1 refills | Status: DC
Start: 2023-09-21 — End: 2024-03-12

## 2023-09-21 NOTE — Telephone Encounter (Signed)
Prescription refill request for Eliquis received. Indication: PAF Last office visit: 09/14/23  Carleene Mains MD Scr: 1.42 on 08/26/23  Epic Age: 89 Weight: 94.6kg  Pt is on Eliquis 2.5mg  twice daily.  Continue current dose due to age and  Borderline renal fx.  Refill approved.

## 2023-10-03 ENCOUNTER — Other Ambulatory Visit: Payer: Self-pay | Admitting: Family Medicine

## 2023-10-04 NOTE — Telephone Encounter (Signed)
Requested Prescriptions  Pending Prescriptions Disp Refills   rosuvastatin (CRESTOR) 20 MG tablet [Pharmacy Med Name: ROSUVASTATIN TABS 20MG ] 90 tablet 0    Sig: TAKE 1 TABLET DAILY (WILL NEED TO SCHEDULE AN OFFICE VISIT FOR FURTHER REFILLS)     Cardiovascular:  Antilipid - Statins 2 Failed - 10/03/2023  3:15 AM      Failed - Cr in normal range and within 360 days    Creat  Date Value Ref Range Status  08/26/2023 1.42 (H) 0.70 - 1.22 mg/dL Final         Failed - Valid encounter within last 12 months    Recent Outpatient Visits           1 year ago Constipation, unspecified constipation type   [facility] Pickard, Priscille Heidelberg, MD   1 year ago General medical exam   [facility] Donita Brooks, MD   3 years ago General medical exam   [facility] Donita Brooks, MD   4 years ago Drop in hemoglobin   [facility] Pickard, Priscille Heidelberg, MD   4 years ago Orthostatic hypotension   [facility] Pickard, Priscille Heidelberg, MD       Future Appointments             In 1 week Tanya Nones, Priscille Heidelberg, MD [facility]            Failed - Lipid Panel in normal range within the last 12 months    Cholesterol  Date Value Ref Range Status  10/06/2022 119 <200 mg/dL Final   LDL Cholesterol (Calc)  Date Value Ref Range Status  10/06/2022 54 mg/dL (calc) Final    Comment:    Reference range: <100 . Desirable range <100 mg/dL for primary prevention;   <70 mg/dL for patients with CHD or diabetic patients  with > or = 2 CHD risk factors. Marland Kitchen LDL-C is now calculated using the Martin-Hopkins  calculation, which is a validated novel method providing  better accuracy than the Friedewald equation in the  estimation of LDL-C.  Horald Pollen et al. Lenox Ahr. 7829;562(13): 2061-2068  (http://education.[facility].com/faq/FAQ164)    HDL  Date Value Ref Range Status  10/06/2022 42 > OR = 40  mg/dL Final   Triglycerides  Date Value Ref Range Status  10/06/2022 149 <150 mg/dL Final         Passed - Patient is not pregnant

## 2023-10-10 ENCOUNTER — Other Ambulatory Visit: Payer: Medicare Other

## 2023-10-10 DIAGNOSIS — E7800 Pure hypercholesterolemia, unspecified: Secondary | ICD-10-CM

## 2023-10-10 DIAGNOSIS — N400 Enlarged prostate without lower urinary tract symptoms: Secondary | ICD-10-CM

## 2023-10-10 DIAGNOSIS — I10 Essential (primary) hypertension: Secondary | ICD-10-CM | POA: Diagnosis not present

## 2023-10-11 LAB — COMPLETE METABOLIC PANEL WITH GFR
AG Ratio: 2.1 (calc) (ref 1.0–2.5)
ALT: 17 U/L (ref 9–46)
AST: 21 U/L (ref 10–35)
Albumin: 4.2 g/dL (ref 3.6–5.1)
Alkaline phosphatase (APISO): 64 U/L (ref 35–144)
BUN/Creatinine Ratio: 11 (calc) (ref 6–22)
BUN: 16 mg/dL (ref 7–25)
CO2: 26 mmol/L (ref 20–32)
Calcium: 9.2 mg/dL (ref 8.6–10.3)
Chloride: 105 mmol/L (ref 98–110)
Creat: 1.41 mg/dL — ABNORMAL HIGH (ref 0.70–1.22)
Globulin: 2 g/dL (ref 1.9–3.7)
Glucose, Bld: 108 mg/dL — ABNORMAL HIGH (ref 65–99)
Potassium: 4.6 mmol/L (ref 3.5–5.3)
Sodium: 139 mmol/L (ref 135–146)
Total Bilirubin: 0.5 mg/dL (ref 0.2–1.2)
Total Protein: 6.2 g/dL (ref 6.1–8.1)
eGFR: 48 mL/min/{1.73_m2} — ABNORMAL LOW (ref >=60)

## 2023-10-11 LAB — CBC WITH DIFFERENTIAL/PLATELET
Absolute Lymphocytes: 1340 {cells}/uL (ref 850–3900)
Absolute Monocytes: 455 {cells}/uL (ref 200–950)
Basophils Absolute: 53 {cells}/uL (ref 0–200)
Basophils Relative: 0.8 %
Eosinophils Absolute: 231 {cells}/uL (ref 15–500)
Eosinophils Relative: 3.5 %
HCT: 42.6 % (ref 38.5–50.0)
Hemoglobin: 13.8 g/dL (ref 13.2–17.1)
MCH: 30.9 pg (ref 27.0–33.0)
MCHC: 32.4 g/dL (ref 32.0–36.0)
MCV: 95.3 fL (ref 80.0–100.0)
MPV: 9.8 fL (ref 7.5–12.5)
Monocytes Relative: 6.9 %
Neutro Abs: 4521 {cells}/uL (ref 1500–7800)
Neutrophils Relative %: 68.5 %
Platelets: 200 10*3/uL (ref 140–400)
RBC: 4.47 10*6/uL (ref 4.20–5.80)
RDW: 12.1 % (ref 11.0–15.0)
Total Lymphocyte: 20.3 %
WBC: 6.6 10*3/uL (ref 3.8–10.8)

## 2023-10-11 LAB — LIPID PANEL
Cholesterol: 111 mg/dL (ref <200)
HDL: 38 mg/dL — ABNORMAL LOW (ref >=40)
LDL Cholesterol (Calc): 52 mg/dL
Non-HDL Cholesterol (Calc): 73 mg/dL (ref <130)
Total CHOL/HDL Ratio: 2.9 (calc) (ref <5.0)
Triglycerides: 128 mg/dL (ref <150)

## 2023-10-11 LAB — PSA: PSA: 3.74 ng/mL (ref <=4.00)

## 2023-10-14 ENCOUNTER — Encounter: Payer: Self-pay | Admitting: Family Medicine

## 2023-10-14 ENCOUNTER — Ambulatory Visit: Payer: Medicare Other | Admitting: Family Medicine

## 2023-10-14 VITALS — BP 120/60 | HR 69 | Temp 97.9°F | Ht 72.0 in | Wt 206.4 lb

## 2023-10-14 DIAGNOSIS — Z0001 Encounter for general adult medical examination with abnormal findings: Secondary | ICD-10-CM | POA: Diagnosis not present

## 2023-10-14 DIAGNOSIS — Z23 Encounter for immunization: Secondary | ICD-10-CM | POA: Diagnosis not present

## 2023-10-14 DIAGNOSIS — Z0000 Encounter for general adult medical examination without abnormal findings: Secondary | ICD-10-CM

## 2023-10-14 DIAGNOSIS — E7800 Pure hypercholesterolemia, unspecified: Secondary | ICD-10-CM | POA: Diagnosis not present

## 2023-10-14 DIAGNOSIS — I10 Essential (primary) hypertension: Secondary | ICD-10-CM

## 2023-10-14 DIAGNOSIS — I480 Paroxysmal atrial fibrillation: Secondary | ICD-10-CM

## 2023-10-14 DIAGNOSIS — C44529 Squamous cell carcinoma of skin of other part of trunk: Secondary | ICD-10-CM | POA: Insufficient documentation

## 2023-10-14 NOTE — Progress Notes (Signed)
Subjective:    Patient ID: David Bush, male    DOB: [birth year], 89 y.o.   MRN: [id number]  HPI Patient is a very pleasant 89 year old Caucasian gentleman here today for complete physical exam.  Patient's PSA has risen.  It increased from 2.7 last year to 3.7 this year.  He denies any hematuria or dysuria or urgency or frequency.  Given his advanced age, we discussed this.  I would recommend that we just monitor this at the present time.  As long as his PSA is not rising rapidly I would not recommend any treatment.  Patient is comfortable with this plan.  Due to his age he does not require colonoscopy.  He denies any falls, depression, or memory loss. Immunization History  Administered Date(s) Administered   Fluad Quad(high Dose 65+) 09/28/2019, 09/01/2020, 10/05/2021, 10/08/2022   Influenza, High Dose Seasonal PF 10/02/1996, 08/25/2017   Influenza,inj,Quad PF,6+ Mos 08/31/2016, 08/30/2018   Influenza-Unspecified 10/06/1997, 10/03/1998, 11/21/2000, 10/13/2001, 10/04/2002, 09/12/2003, 10/07/2004, 09/05/2013   PFIZER Comirnaty(Gray Top)Covid-19 Tri-Sucrose Vaccine 04/13/2021   PFIZER(Purple Top)SARS-COV-2 Vaccination 12/31/2019, 01/21/2020, 09/08/2020   Pneumococcal Conjugate-13 10/01/2016   Pneumococcal Polysaccharide-23 12/06/2013   Pneumococcal-Unspecified 08/28/2001   Lab on 10/10/2023  Component Date Value Ref Range Status   WBC 10/10/2023 6.6  3.8 - 10.8 Thousand/uL Final   RBC 10/10/2023 4.47  4.20 - 5.80 Million/uL Final   Hemoglobin 10/10/2023 13.8  13.2 - 17.1 g/dL Final   HCT 40/98/1191 42.6  38.5 - 50.0 % Final   MCV 10/10/2023 95.3  80.0 - 100.0 fL Final   MCH 10/10/2023 30.9  27.0 - 33.0 pg Final   MCHC 10/10/2023 32.4  32.0 - 36.0 g/dL Final   Comment: For adults, a slight decrease in the calculated MCHC value (in the range of 30 to 32 g/dL) is most likely not clinically significant; however, it should be interpreted with caution in correlation with other red  cell parameters and the patient's clinical condition.    RDW 10/10/2023 12.1  11.0 - 15.0 % Final   Platelets 10/10/2023 200  140 - 400 Thousand/uL Final   MPV 10/10/2023 9.8  7.5 - 12.5 fL Final   Neutro Abs 10/10/2023 4,521  1,500 - 7,800 cells/uL Final   Absolute Lymphocytes 10/10/2023 1,340  850 - 3,900 cells/uL Final   Absolute Monocytes 10/10/2023 455  200 - 950 cells/uL Final   Eosinophils Absolute 10/10/2023 231  15 - 500 cells/uL Final   Basophils Absolute 10/10/2023 53  0 - 200 cells/uL Final   Neutrophils Relative % 10/10/2023 68.5  % Final   Total Lymphocyte 10/10/2023 20.3  % Final   Monocytes Relative 10/10/2023 6.9  % Final   Eosinophils Relative 10/10/2023 3.5  % Final   Basophils Relative 10/10/2023 0.8  % Final   Glucose, Bld 10/10/2023 108 (H)  65 - 99 mg/dL Final   Comment: .            Fasting reference interval . For someone without known diabetes, a glucose value between 100 and 125 mg/dL is consistent with prediabetes and should be confirmed with a follow-up test. .    BUN 10/10/2023 16  7 - 25 mg/dL Final   Creat 47/82/9562 1.41 (H)  0.70 - 1.22 mg/dL Final   eGFR 13/07/6577 48 (L)  > OR = 60 mL/min/1.72m2 Final   BUN/Creatinine Ratio 10/10/2023 11  6 - 22 (calc) Final   Sodium 10/10/2023 139  135 - 146 mmol/L Final   Potassium 10/10/2023 4.6  3.5 - 5.3 mmol/L Final   Chloride 10/10/2023 105  98 - 110 mmol/L Final   CO2 10/10/2023 26  20 - 32 mmol/L Final   Calcium 10/10/2023 9.2  8.6 - 10.3 mg/dL Final   Total Protein 16/09/9603 6.2  6.1 - 8.1 g/dL Final   Albumin 54/08/8118 4.2  3.6 - 5.1 g/dL Final   Globulin 14/78/2956 2.0  1.9 - 3.7 g/dL (calc) Final   AG Ratio 10/10/2023 2.1  1.0 - 2.5 (calc) Final   Total Bilirubin 10/10/2023 0.5  0.2 - 1.2 mg/dL Final   Alkaline phosphatase (APISO) 10/10/2023 64  35 - 144 U/L Final   AST 10/10/2023 21  10 - 35 U/L Final   ALT 10/10/2023 17  9 - 46 U/L Final   Cholesterol 10/10/2023 111  <200 mg/dL Final    HDL 21/30/8657 38 (L)  > OR = 40 mg/dL Final   Triglycerides 84/69/6295 128  <150 mg/dL Final   LDL Cholesterol (Calc) 10/10/2023 52  mg/dL (calc) Final   Comment: Reference range: <100 . Desirable range <100 mg/dL for primary prevention;   <70 mg/dL for patients with CHD or diabetic patients  with > or = 2 CHD risk factors. Marland Kitchen LDL-C is now calculated using the Martin-Hopkins  calculation, which is a validated novel method providing  better accuracy than the Friedewald equation in the  estimation of LDL-C.  Horald Pollen et al. Lenox Ahr. 2841;324(40): 2061-2068  (http://education.[facility].com/faq/FAQ164)    Total CHOL/HDL Ratio 10/10/2023 2.9  <1.0 (calc) Final   Non-HDL Cholesterol (Calc) 10/10/2023 73  <130 mg/dL (calc) Final   Comment: For patients with diabetes plus 1 major ASCVD risk  factor, treating to a non-HDL-C goal of <100 mg/dL  (LDL-C of <27 mg/dL) is considered a therapeutic  option.    PSA 10/10/2023 3.74  < OR = 4.00 ng/mL Final   Comment: The total PSA value from this assay system is  standardized against the WHO standard. The test  result will be approximately 20% lower when compared  to the equimolar-standardized total PSA (Beckman  Coulter). Comparison of serial PSA results should be  interpreted with this fact in mind. . This test was performed using the Siemens  chemiluminescent method. Values obtained from  different assay methods cannot be used interchangeably. PSA levels, regardless of value, should not be interpreted as absolute evidence of the presence or absence of disease.     Past Medical History:  Diagnosis Date   Atrial fibrillation (HCC)    Barrett's esophagus    Cataract    surgery to remove bilateral   Chest pain    in feb 2020/ saw cardiologist- no heart attack!   CKD (chronic kidney disease) stage 3, GFR 30-59 ml/min (HCC)    GERD (gastroesophageal reflux disease)    Hx of hiatal hernia    Hyperlipidemia    Hypertension    Skin  cancer    squamous cell of face/arm/legs   Substance abuse (HCC)    stopped drinking in 1993   Past Surgical History:  Procedure Laterality Date   CATARACT EXTRACTION, BILATERAL  2012   COLONOSCOPY     KNEE ARTHROSCOPY Left 2010   torn meniscus   SKIN CANCER EXCISION  2015, 2012   face, arms and legs   UPPER GASTROINTESTINAL ENDOSCOPY     barrett's esophagus   WISDOM TOOTH EXTRACTION  1980   WISDOM TOOTH EXTRACTION     Current Outpatient Medications on File Prior to Visit  Medication Sig  Dispense Refill   acetaminophen (TYLENOL) 325 MG tablet Take 650 mg by mouth every 6 (six) hours as needed for moderate pain or headache.     amLODipine (NORVASC) 5 MG tablet TAKE 1 TABLET DAILY 90 tablet 0   apixaban (ELIQUIS) 2.5 MG TABS tablet Take 1 tablet (2.5 mg total) by mouth 2 (two) times daily. 180 tablet 1   benazepril (LOTENSIN) 40 MG tablet TAKE 1 TABLET DAILY 90 tablet 3   finasteride (PROSCAR) 5 MG tablet TAKE 1 TABLET DAILY. 90 tablet 1   flecainide (TAMBOCOR) 100 MG tablet TAKE 1 TABLET TWICE A DAY 180 tablet 1   metoprolol succinate (TOPROL-XL) 25 MG 24 hr tablet TAKE 1 TABLET DAILY 90 tablet 3   Multiple Vitamins-Minerals (CENTRUM SILVER PO) Take 1 tablet by mouth daily.     pantoprazole (PROTONIX) 40 MG tablet TAKE 1 TABLET DAILY 90 tablet 3   pantoprazole (PROTONIX) 40 MG tablet Take 1 tablet (40 mg total) by mouth 2 (two) times daily. 180 tablet 3   rosuvastatin (CRESTOR) 20 MG tablet TAKE 1 TABLET DAILY (WILL NEED TO SCHEDULE AN OFFICE VISIT FOR FURTHER REFILLS) 90 tablet 0   sucralfate (CARAFATE) 1 g tablet Take 1 tablet (1 g total) by mouth 4 (four) times daily -  with meals and at bedtime. 120 tablet 1   nitroGLYCERIN (NITROSTAT) 0.4 MG SL tablet Place 1 tablet (0.4 mg total) under the tongue every 5 (five) minutes as needed for chest pain. 25 tablet 3   No current facility-administered medications on file prior to visit.   Allergies  Allergen Reactions   Diltiazem  Itching and Rash   Social History   Socioeconomic History   Marital status: Married    Spouse name: Not on file   Number of children: 1   Years of education: Not on file   Highest education level: Not on file  Occupational History   Not on file  Tobacco Use   Smoking status: Former    Current packs/day: 0.00    Types: Cigarettes    Start date: 12/06/1950    Quit date: 12/06/1966    Years since quitting: 56.8   Smokeless tobacco: Never  Vaping Use   Vaping status: Never Used  Substance and Sexual Activity   Alcohol use: No    Alcohol/week: 0.0 standard drinks of alcohol    Comment: 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   Drug use: No   Sexual activity: Not on file  Other Topics Concern   Not on file  Social History Narrative   Not on file   Social Determinants of Health   Financial Resource Strain: Not on file  Food Insecurity: Not on file  Transportation Needs: Not on file  Physical Activity: Not on file  Stress: Not on file  Social Connections: Unknown (04/20/2022)   Received from [facility]   Social Network    Social Network: Not on file  Intimate Partner Violence: Unknown (03/12/2022)   Received from [facility]   HITS    Physically Hurt: Not on file    Insult or Talk Down To: Not on file    Threaten Physical Harm: Not on file    Scream or Curse: Not on file   Family History  Problem Relation Age of Onset   Esophageal cancer Mother    Stroke Father    Kidney cancer Sister    Kidney disease Brother    Other Brother  Heart disease Brother    Colon cancer Neg Hx    Colon polyps Neg Hx    Rectal cancer Neg Hx    Stomach cancer Neg Hx       Review of Systems  All other systems reviewed and are negative.      Objective:   Physical Exam Vitals reviewed.  Constitutional:      General: He is not in acute distress.    Appearance: He is well-developed. He is not diaphoretic.  HENT:     Head: Normocephalic  and atraumatic.     Right Ear: External ear normal.     Left Ear: External ear normal.     Nose: Nose normal.     Mouth/Throat:     Pharynx: No oropharyngeal exudate.  Eyes:     General: No scleral icterus.       Right eye: No discharge.        Left eye: No discharge.     Conjunctiva/sclera: Conjunctivae normal.     Pupils: Pupils are equal, round, and reactive to light.  Neck:     Thyroid: No thyromegaly.     Vascular: No JVD.     Trachea: No tracheal deviation.  Cardiovascular:     Rate and Rhythm: Normal rate and regular rhythm.     Heart sounds: Murmur heard.     No friction rub. No gallop.  Pulmonary:     Effort: Pulmonary effort is normal. No respiratory distress.     Breath sounds: Normal breath sounds. No stridor. No wheezing or rales.  Chest:     Chest wall: No tenderness.  Abdominal:     General: Bowel sounds are normal. There is no distension.     Palpations: Abdomen is soft. There is no mass.     Tenderness: There is no abdominal tenderness. There is no guarding or rebound.  Genitourinary:    Penis: Normal.      Prostate: Normal.     Rectum: Normal.  Musculoskeletal:        General: No deformity.     Cervical back: Normal range of motion and neck supple.  Lymphadenopathy:     Cervical: No cervical adenopathy.  Skin:    General: Skin is warm.     Coloration: Skin is not pale.     Findings: No erythema or rash.  Neurological:     Mental Status: He is alert and oriented to person, place, and time.     Cranial Nerves: No cranial nerve deficit.     Motor: No abnormal muscle tone.     Coordination: Coordination normal.     Deep Tendon Reflexes: Reflexes are normal and symmetric.  Psychiatric:        Behavior: Behavior normal.        Thought Content: Thought content normal.        Judgment: Judgment normal.           Assessment & Plan:  General medical exam  Benign essential HTN  Pure hypercholesterolemia  Paroxysmal atrial fibrillation  (HCC) Patient received his flu shot today.  I recommended the RSV vaccine I recommended that we recheck his PSA in 6 months just to monitor it more closely.  As long as it is stable, I would not recommend any treatment.  He does not require colonoscopy.  His cholesterol is outstanding.  He does have some age-related chronic kidney disease stage IIIa.  I recommended that he avoid NSAIDs and drink plenty of water every  day.  There is a mild elevation in his blood sugar and I recommended that he reduce his intake of carbohydrates.  Otherwise he is doing quite well.  Regular anticipatory guidance is provided.

## 2023-10-14 NOTE — Addendum Note (Signed)
Addended by: Venia Carbon K on: 10/14/2023 09:26 AM   Modules accepted: Orders

## 2023-10-20 DIAGNOSIS — D0462 Carcinoma in situ of skin of left upper limb, including shoulder: Secondary | ICD-10-CM | POA: Diagnosis not present

## 2023-10-20 DIAGNOSIS — C44622 Squamous cell carcinoma of skin of right upper limb, including shoulder: Secondary | ICD-10-CM | POA: Diagnosis not present

## 2023-10-20 DIAGNOSIS — Z85828 Personal history of other malignant neoplasm of skin: Secondary | ICD-10-CM | POA: Diagnosis not present

## 2023-10-20 DIAGNOSIS — L570 Actinic keratosis: Secondary | ICD-10-CM | POA: Diagnosis not present

## 2023-10-20 DIAGNOSIS — D225 Melanocytic nevi of trunk: Secondary | ICD-10-CM | POA: Diagnosis not present

## 2023-10-20 DIAGNOSIS — L578 Other skin changes due to chronic exposure to nonionizing radiation: Secondary | ICD-10-CM | POA: Diagnosis not present

## 2023-10-20 DIAGNOSIS — D485 Neoplasm of uncertain behavior of skin: Secondary | ICD-10-CM | POA: Diagnosis not present

## 2023-10-20 DIAGNOSIS — L821 Other seborrheic keratosis: Secondary | ICD-10-CM | POA: Diagnosis not present

## 2023-10-27 ENCOUNTER — Other Ambulatory Visit: Payer: Self-pay

## 2023-10-27 ENCOUNTER — Telehealth: Payer: Self-pay

## 2023-10-27 DIAGNOSIS — I10 Essential (primary) hypertension: Secondary | ICD-10-CM

## 2023-10-27 MED ORDER — BENAZEPRIL HCL 40 MG PO TABS: 40.0000 mg | ORAL_TABLET | Freq: Every day | ORAL | 3 refills | Status: DC

## 2023-10-27 NOTE — Telephone Encounter (Signed)
Copied from CRM [id number]. Topic: Clinical - Medication Refill >> Oct 27, 2023  4:18 PM [name] wrote: Most Recent Primary Care Visit:  Provider: Lynnea Ferrier T  Department: [facility]  Visit Type: PHYSICAL  Date: 10/14/2023  Medication: benazepril (LOTENSIN) 40 MG tablet  Has the patient contacted their pharmacy? Yes (Agent: If no, request that the patient contact the pharmacy for the refill. If patient does not wish to contact the pharmacy document the reason why and proceed with request.) (Agent: If yes, when and what did the pharmacy advise?)  Is this the correct pharmacy for this prescription? Yes If no, delete pharmacy and type the correct one.  This is the patient's preferred pharmacy:  [facility] DELIV[facility] - [place], [place] - [address] [place] [address] Phone: [phone] Fax: [phone]     Has the prescription been filled recently? No  Is the patient out of the medication? Yes  Has the patient been seen for an appointment in the last year OR does the patient have an upcoming appointment? Yes  Can we respond through MyChart? No  Agent: Please be advised that Rx refills may take up to 3 business days. We ask that you follow-up with your pharmacy.

## 2023-10-31 ENCOUNTER — Other Ambulatory Visit: Payer: Self-pay

## 2023-10-31 ENCOUNTER — Telehealth: Payer: Self-pay

## 2023-10-31 DIAGNOSIS — I10 Essential (primary) hypertension: Secondary | ICD-10-CM

## 2023-10-31 MED ORDER — BENAZEPRIL HCL 40 MG PO TABS: 40.0000 mg | ORAL_TABLET | Freq: Every day | ORAL | 1 refills | Status: DC

## 2023-10-31 NOTE — Telephone Encounter (Signed)
Copied from CRM [id number]. Topic: Clinical - Medication Refill >> Oct 27, 2023  4:18 PM [name] wrote: Most Recent Primary Care Visit:  Provider: Lynnea Ferrier T  Department: [facility]  Visit Type: PHYSICAL  Date: 10/14/2023  Medication: benazepril (LOTENSIN) 40 MG tablet  Has the patient contacted their pharmacy? Yes (Agent: If no, request that the patient contact the pharmacy for the refill. If patient does not wish to contact the pharmacy document the reason why and proceed with request.) (Agent: If yes, when and what did the pharmacy advise?)  Is this the correct pharmacy for this prescription? Yes If no, delete pharmacy and type the correct one.  This is the patient's preferred pharmacy:  [facility] - [place], [place] - [address] [place] [address] Phone: [phone] Fax: [phone]     Has the prescription been filled recently? No  Is the patient out of the medication? Yes  Has the patient been seen for an appointment in the last year OR does the patient have an upcoming appointment? Yes  Can we respond through MyChart? No  Agent: Please be advised that Rx refills may take up to 3 business days. We ask that you follow-up with your pharmacy.

## 2023-10-31 NOTE — Telephone Encounter (Signed)
Copied from CRM [id number]. Topic: Clinical - Medication Refill >> Oct 27, 2023  4:18 PM [name] wrote: Most Recent Primary Care Visit:  Provider: Lynnea Ferrier T  Department: [facility]  Visit Type: PHYSICAL  Date: 10/14/2023  Medication: benazepril (LOTENSIN) 40 MG tablet  Has the patient contacted their pharmacy? Yes (Agent: If no, request that the patient contact the pharmacy for the refill. If patient does not wish to contact the pharmacy document the reason why and proceed with request.) (Agent: If yes, when and what did the pharmacy advise?)  Is this the correct pharmacy for this prescription? Yes If no, delete pharmacy and type the correct one.  This is the patient's preferred pharmacy:  [facility] DELIV[facility] - [place], [place] - [address] [place] [address] Phone: [phone] Fax: [phone]     Has the prescription been filled recently? No  Is the patient out of the medication? Yes  Has the patient been seen for an appointment in the last year OR does the patient have an upcoming appointment? Yes  Can we respond through MyChart? No  Agent: Please be advised that Rx refills may take up to 3 business days. We ask that you follow-up with your pharmacy.

## 2023-11-15 ENCOUNTER — Telehealth: Payer: Self-pay | Admitting: Cardiology

## 2023-11-15 DIAGNOSIS — R011 Cardiac murmur, unspecified: Secondary | ICD-10-CM

## 2023-11-15 NOTE — Telephone Encounter (Signed)
Patient states during his most recent appointment something serious happened and he would like to discuss his concerns with Dr. Elberta Fortis' nurse. He states he has been thinking about it for the past 2 months. He states it was disappointing and he has considered switching doctors, and would like to speak directly with RN to discuss further.

## 2023-11-15 NOTE — Telephone Encounter (Signed)
Called patient who states he has been thinking about this/bothered by this since he saw Dr. Elberta Fortis in October. Says when he saw MD that he came in after EKG & VS.  "He opened the door and stood there by the door and we talked about things", but after about 2 or 3 minutes he said things looked good and would see him in 6 months.  "He did not listen to my heart or nothing".   Patient then reports he went to his PCP a few weeks later for his normal physical/follow up.  During this PCP listed to his heart and said he "had a pretty bad murmur and asked when I saw my cardiologist and I told him I just saw him". Patient voices that he is very disappointed w/ Dr. Elberta Fortis for not checking this or catching this.  He is not sure what he wants to do going forward. Patient informed that I would discuss this more w/ MD and reach back out after discussing things with him. Patient is agreeable to plan.

## 2023-11-16 NOTE — Telephone Encounter (Signed)
Discussed with patient what Dr. Elberta Fortis had to say and that he apologized for the situation that occurred and pt's disappointment afterwards.  He is aware what happened was not a typical occurrence.  Patient appreciates the acknowledgment of this and knows that was not like him. Advised MD recommends and echocardiogram follow up.  Aware I will place order and someone will call him to arrange. Patient verbalized understanding and agreeable to plan.

## 2023-11-21 ENCOUNTER — Other Ambulatory Visit: Payer: Self-pay | Admitting: Family Medicine

## 2023-11-21 DIAGNOSIS — I10 Essential (primary) hypertension: Secondary | ICD-10-CM

## 2023-12-22 ENCOUNTER — Ambulatory Visit (HOSPITAL_COMMUNITY): Payer: Medicare Other | Attending: Internal Medicine

## 2023-12-22 DIAGNOSIS — R011 Cardiac murmur, unspecified: Secondary | ICD-10-CM | POA: Diagnosis not present

## 2023-12-22 LAB — ECHOCARDIOGRAM COMPLETE
AR max vel: 1.48 cm2
AV Area VTI: 1.57 cm2
AV Area mean vel: 1.44 cm2
AV Mean grad: 9 mm[Hg]
AV Peak grad: 18.5 mm[Hg]
Ao pk vel: 2.15 m/s
Area-P 1/2: 2.36 cm2
S' Lateral: 3.2 cm

## 2024-01-02 ENCOUNTER — Encounter: Payer: Self-pay | Admitting: Family Medicine

## 2024-01-02 ENCOUNTER — Other Ambulatory Visit: Payer: Self-pay | Admitting: Family Medicine

## 2024-01-02 ENCOUNTER — Ambulatory Visit (INDEPENDENT_AMBULATORY_CARE_PROVIDER_SITE_OTHER): Payer: Medicare Other | Admitting: Family Medicine

## 2024-01-02 VITALS — BP 122/80 | HR 68 | Temp 97.6°F | Ht 72.0 in | Wt 205.0 lb

## 2024-01-02 DIAGNOSIS — R972 Elevated prostate specific antigen [PSA]: Secondary | ICD-10-CM | POA: Diagnosis not present

## 2024-01-02 DIAGNOSIS — J069 Acute upper respiratory infection, unspecified: Secondary | ICD-10-CM

## 2024-01-02 MED ORDER — HYDROCODONE BIT-HOMATROP MBR 5-1.5 MG/5ML PO SOLN: 5.0000 mL | Freq: Three times a day (TID) | ORAL | 0 refills | Status: AC | PRN

## 2024-01-02 MED ORDER — AZITHROMYCIN 250 MG PO TABS: ORAL_TABLET | ORAL | 0 refills | Status: DC

## 2024-01-02 NOTE — Progress Notes (Signed)
Subjective:    Patient ID: David Bush, male    DOB: 01-Oct-1934, 89 y.o.   MRN: [id number] Patient is a very pleasant 89 year old Caucasian gentleman has been coughing since Wednesday of last week.  The cough is nonproductive.  He does have copious rhinorrhea.  He denies any purulent sputum.  He denies any sinus pain.  He denies any shortness of breath.  He denies any pleurisy.  He denies any otalgia.  He denies any sore throat.  The cough is more frustrating.  However he states that he is feeling better than he was last week.  Friday last week was the worst day.   Past Medical History:  Diagnosis Date   Atrial fibrillation (HCC)    Barrett's esophagus    Cataract    surgery to remove bilateral   Chest pain    in feb 2020/ saw cardiologist- no heart attack!   CKD (chronic kidney disease) stage 3, GFR 30-59 ml/min (HCC)    GERD (gastroesophageal reflux disease)    Hx of hiatal hernia    Hyperlipidemia    Hypertension    Skin cancer    squamous cell of face/arm/legs   Substance abuse (HCC)    stopped drinking in 1993   Past Surgical History:  Procedure Laterality Date   CATARACT EXTRACTION, BILATERAL  2012   COLONOSCOPY     KNEE ARTHROSCOPY Left 2010   torn meniscus   SKIN CANCER EXCISION  2015, 2012   face, arms and legs   UPPER GASTROINTESTINAL ENDOSCOPY     barrett's esophagus   WISDOM TOOTH EXTRACTION  1980   WISDOM TOOTH EXTRACTION     Current Outpatient Medications on File Prior to Visit  Medication Sig Dispense Refill   acetaminophen (TYLENOL) 325 MG tablet Take 650 mg by mouth every 6 (six) hours as needed for moderate pain or headache.     amLODipine (NORVASC) 5 MG tablet TAKE 1 TABLET DAILY 90 tablet 3   apixaban (ELIQUIS) 2.5 MG TABS tablet Take 1 tablet (2.5 mg total) by mouth 2 (two) times daily. 180 tablet 1   benazepril (LOTENSIN) 40 MG tablet Take 1 tablet (40 mg total) by mouth daily. 90 tablet 1   finasteride (PROSCAR) 5 MG tablet TAKE 1 TABLET  DAILY. 90 tablet 1   flecainide (TAMBOCOR) 100 MG tablet TAKE 1 TABLET TWICE A DAY 180 tablet 1   metoprolol succinate (TOPROL-XL) 25 MG 24 hr tablet TAKE 1 TABLET DAILY 90 tablet 3   Multiple Vitamins-Minerals (CENTRUM SILVER PO) Take 1 tablet by mouth daily.     nitroGLYCERIN (NITROSTAT) 0.4 MG SL tablet Place 1 tablet (0.4 mg total) under the tongue every 5 (five) minutes as needed for chest pain. 25 tablet 3   pantoprazole (PROTONIX) 40 MG tablet TAKE 1 TABLET DAILY 90 tablet 3   pantoprazole (PROTONIX) 40 MG tablet Take 1 tablet (40 mg total) by mouth 2 (two) times daily. 180 tablet 3   rosuvastatin (CRESTOR) 20 MG tablet TAKE 1 TABLET DAILY (WILL NEED TO SCHEDULE AN OFFICE VISIT FOR FURTHER REFILLS) 90 tablet 0   sucralfate (CARAFATE) 1 g tablet Take 1 tablet (1 g total) by mouth 4 (four) times daily -  with meals and at bedtime. 120 tablet 1   No current facility-administered medications on file prior to visit.   Allergies  Allergen Reactions   Diltiazem Itching and Rash   Social History   Socioeconomic History   Marital status: Married  Spouse name: Not on file   Number of children: 1   Years of education: Not on file   Highest education level: Some college, no degree  Occupational History   Not on file  Tobacco Use   Smoking status: Former    Current packs/day: 0.00    Types: Cigarettes    Start date: 12/06/1950    Quit date: 12/06/1966    Years since quitting: 57.1   Smokeless tobacco: Never  Vaping Use   Vaping status: Never Used  Substance and Sexual Activity   Alcohol use: No    Alcohol/week: 0.0 standard drinks of alcohol    Comment: 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   Drug use: No   Sexual activity: Not on file  Other Topics Concern   Not on file  Social History Narrative   Not on file   Social Drivers of Health   Financial Resource Strain: Low Risk  (12/31/2023)   Overall Financial Resource Strain (CARDIA)    Difficulty of Paying Living  Expenses: Not hard at all  Food Insecurity: No Food Insecurity (12/31/2023)   Hunger Vital Sign    Worried About Running Out of Food in the Last Year: Never true    Ran Out of Food in the Last Year: Never true  Transportation Needs: No Transportation Needs (12/31/2023)   PRAPARE - Administrator, Civil Service (Medical): No    Lack of Transportation (Non-Medical): No  Physical Activity: Insufficiently Active (12/31/2023)   Exercise Vital Sign    Days of Exercise per Week: 3 days    Minutes of Exercise per Session: 40 min  Stress: No Stress Concern Present (12/31/2023)   [employer] of Occupational Health - Occupational Stress Questionnaire    Feeling of Stress : Not at all  Social Connections: Socially Integrated (12/31/2023)   Social Connection and Isolation Panel [NHANES]    Frequency of Communication with Friends and Family: More than three times a week    Frequency of Social Gatherings with Friends and Family: Three times a week    Attends Religious Services: More than 4 times per year    Active Member of Clubs or Organizations: Yes    Attends Banker Meetings: More than 4 times per year    Marital Status: Married  Catering manager Violence: Unknown (03/12/2022)   Received from [employer], [facility]   HITS    Physically Hurt: Not on file    Insult or Talk Down To: Not on file    Threaten Physical Harm: Not on file    Scream or Curse: Not on file   Family History  Problem Relation Age of Onset   Esophageal cancer Mother    Stroke Father    Kidney cancer Sister    Kidney disease Brother    Other Brother    Heart disease Brother    Colon cancer Neg Hx    Colon polyps Neg Hx    Rectal cancer Neg Hx    Stomach cancer Neg Hx       Review of Systems  All other systems reviewed and are negative.      Objective:   Physical Exam Vitals reviewed.  Constitutional:      General: He is not in acute distress.    Appearance: He is  well-developed. He is not diaphoretic.  HENT:     Head: Normocephalic and atraumatic.     Right Ear: External ear normal.  Left Ear: External ear normal.     Nose: Nose normal.     Mouth/Throat:     Pharynx: No oropharyngeal exudate.  Eyes:     General: No scleral icterus.       Right eye: No discharge.        Left eye: No discharge.     Conjunctiva/sclera: Conjunctivae normal.     Pupils: Pupils are equal, round, and reactive to light.  Neck:     Thyroid: No thyromegaly.     Vascular: No JVD.     Trachea: No tracheal deviation.  Cardiovascular:     Rate and Rhythm: Normal rate and regular rhythm.     Heart sounds: Murmur heard.     No friction rub. No gallop.  Pulmonary:     Effort: Pulmonary effort is normal. No respiratory distress.     Breath sounds: Normal breath sounds. No stridor. No wheezing or rales.  Chest:     Chest wall: No tenderness.  Musculoskeletal:     Cervical back: Normal range of motion and neck supple.  Lymphadenopathy:     Cervical: No cervical adenopathy.           Assessment & Plan:  Elevated PSA - Plan: PSA  Upper respiratory tract infection, unspecified type I believe the patient has a viral upper respiratory infection.  I recommended tincture of time.  I will treat the patient supportively with Hycodan to help him sleep at night and stop the coughing.  Otherwise I expect the cough and virus to pass over the next 3 to 4 days on its own.  I did give him a Z-Pak with strict instructions not to fill unless he develops purulent sputum and fever.  We will repeat a PSA while the patient is here.  In November he was found to have an elevated PSA and we want to monitor its trajectory

## 2024-01-03 ENCOUNTER — Other Ambulatory Visit: Payer: Self-pay | Admitting: Family Medicine

## 2024-01-03 DIAGNOSIS — I10 Essential (primary) hypertension: Secondary | ICD-10-CM

## 2024-01-03 LAB — PSA: PSA: 3.56 ng/mL (ref <=4.00)

## 2024-01-03 NOTE — Telephone Encounter (Signed)
Requested by interface surescripts. Last OV 01/02/24.  Requested Prescriptions  Pending Prescriptions Disp Refills   finasteride (PROSCAR) 5 MG tablet [Pharmacy Med Name: FINASTERIDE TABS 5MG ] 90 tablet 3    Sig: TAKE 1 TABLET DAILY     Urology: 5-alpha Reductase Inhibitors Failed - 01/03/2024  2:17 PM      Failed - Valid encounter within last 12 months    Recent Outpatient Visits           1 year ago Constipation, unspecified constipation type   [facility] Pickard, Priscille Heidelberg, MD   2 years ago General medical exam   [facility] Donita Brooks, MD   3 years ago General medical exam   [facility] Donita Brooks, MD   4 years ago Drop in hemoglobin   [facility] Pickard, Priscille Heidelberg, MD   4 years ago Orthostatic hypotension   [facility] Pickard, Priscille Heidelberg, MD              Passed - PSA in normal range and within 360 days    PSA  Date Value Ref Range Status  01/02/2024 3.56 < OR = 4.00 ng/mL Final    Comment:    The total PSA value from this assay system is  standardized against the WHO standard. The test  result will be approximately 20% lower when compared  to the equimolar-standardized total PSA (Beckman  Coulter). Comparison of serial PSA results should be  interpreted with this fact in mind. . This test was performed using the Siemens  chemiluminescent method. Values obtained from  different assay methods cannot be used interchangeably. PSA levels, regardless of value, should not be interpreted as absolute evidence of the presence or absence of disease.

## 2024-01-06 ENCOUNTER — Ambulatory Visit: Payer: Medicare Other | Admitting: Family Medicine

## 2024-01-10 DIAGNOSIS — L820 Inflamed seborrheic keratosis: Secondary | ICD-10-CM | POA: Diagnosis not present

## 2024-01-10 DIAGNOSIS — L988 Other specified disorders of the skin and subcutaneous tissue: Secondary | ICD-10-CM | POA: Diagnosis not present

## 2024-01-10 DIAGNOSIS — C44622 Squamous cell carcinoma of skin of right upper limb, including shoulder: Secondary | ICD-10-CM | POA: Diagnosis not present

## 2024-01-10 DIAGNOSIS — D0462 Carcinoma in situ of skin of left upper limb, including shoulder: Secondary | ICD-10-CM | POA: Diagnosis not present

## 2024-01-10 DIAGNOSIS — D485 Neoplasm of uncertain behavior of skin: Secondary | ICD-10-CM | POA: Diagnosis not present

## 2024-01-10 DIAGNOSIS — C44729 Squamous cell carcinoma of skin of left lower limb, including hip: Secondary | ICD-10-CM | POA: Diagnosis not present

## 2024-01-14 ENCOUNTER — Other Ambulatory Visit: Payer: Self-pay | Admitting: Cardiology

## 2024-01-16 ENCOUNTER — Telehealth: Payer: Self-pay | Admitting: Cardiology

## 2024-01-16 NOTE — Telephone Encounter (Signed)
 Patient wants a provider switch from Dr. Lawana Pray to Dr. Daneil Dunker.

## 2024-01-30 ENCOUNTER — Other Ambulatory Visit: Payer: Self-pay | Admitting: Cardiology

## 2024-01-30 DIAGNOSIS — I480 Paroxysmal atrial fibrillation: Secondary | ICD-10-CM

## 2024-02-27 ENCOUNTER — Other Ambulatory Visit: Payer: Self-pay | Admitting: Cardiology

## 2024-02-27 DIAGNOSIS — I480 Paroxysmal atrial fibrillation: Secondary | ICD-10-CM

## 2024-02-27 NOTE — Telephone Encounter (Signed)
 Prescription refill request for Eliquis received. Indication: Afib  Last office visit: 09/14/23 ([place])  Scr: 1.41 (10/10/23)  Age: 89 Weight: 93kg  Per dosing criteria, current dose is not appropriate. Will forward to PharmD to review.

## 2024-03-09 NOTE — Telephone Encounter (Signed)
 Would recommended increasing dose. He has not had a scr >1.5 for a few years

## 2024-03-12 NOTE — Telephone Encounter (Signed)
 Per Malena Peer, PharmD, Eliquis dose should be increased to 5mg  BID. Called and spoke with pt's wife, Mervyn Gay. Made her aware of dose increase from 2.5mg  to 5mg . Verbalized understanding and was very appreciative for the call. Pt also has a scheduled appt with Dr Jimmey Ralph on Friday, 03/16/24 and she states they will discuss this chang with him as well. Refill sent.

## 2024-03-15 NOTE — Progress Notes (Signed)
 Electrophysiology Office Note:   Date:  03/16/2024  ID:  David Bush, DOB Apr 19, 1934, MRN [id number]  Primary Cardiologist: None Electrophysiologist: Nobie Putnam, MD      History of Present Illness:   David Bush is a 89 y.o. male with h/o Barrett's esophagus, CKD stage III, hypertension, hyperlipidemia, atrial fibrillation, aortic aneurysm who is being seen today for electrophysiology follow-up.  Discussed the use of AI scribe software for clinical note transcription with the patient, who gave verbal consent to proceed.  History of Present Illness David Bush "David Bush" is a 89 year old male with atrial fibrillation who presents for follow-up regarding echocardiogram results and medication management. He has a history of atrial fibrillation and has been on flecainide, with no episodes of atrial fibrillation since an episode five years ago. He takes flecainide 100 mg twice daily. Recently, his Eliquis dosage was changed from 2.5 mg twice daily to 5 mg twice daily, adjusted based on age, body size, and kidney function. He is concerned about bleeding, but has no active bleeding issues. He was seeking clarification regarding his echo results. His echocardiogram shows stable ascending aorta dilatation, measuring 43 mm, which is a 1 mm increase from a previous measurement in 2023. His heart valves, including the mitral and aortic valves, show no significant valvular disease, with only trivial mitral regurgitation and mild aortic stenosis noted. He is otherwise well with no new or acute complaints. He can do his usual activities without difficulty. In terms of his social history, he has given up playing golf due to fatigue and a loss of interest, despite previously enjoying the sport and achieving scores below his age. He now prefers reading and taking things slowly, stating, 'I love to read.' He can work in the yard for 30-40 minutes before feeling fatigued.  Review of systems  complete and found to be negative unless listed in HPI.   EP Information / Studies Reviewed:    EKG is ordered today. Personal review as below.  EKG Interpretation Date/Time:  Friday March 16 2024 07:50:34 EDT Ventricular Rate:  59 PR Interval:  226 QRS Duration:  116 QT Interval:  470 QTC Calculation: 465 R Axis:   -40  Text Interpretation: Sinus bradycardia with 1st degree A-V block Left axis deviation When compared with ECG of 14-Sep-2023 11:25, No significant change was found Confirmed by Nobie Putnam [phone]) on 03/16/2024 8:02:30 AM   Echo 12/22/2023:  1. Left ventricular ejection fraction, by estimation, is 55 to 60%. Left  ventricular ejection fraction by 3D volume is 56 %. The left ventricle has  normal function. The left ventricle has no regional wall motion  abnormalities. Left ventricular diastolic   parameters were normal. The average left ventricular global longitudinal  strain is -21.9 %.   2. Right ventricular systolic function is normal. The right ventricular  size is normal. Tricuspid regurgitation signal is inadequate for assessing  PA pressure.   3. The mitral valve is normal in structure. Trivial mitral valve  regurgitation. No evidence of mitral stenosis.   4. The aortic valve is tricuspid. There is moderate calcification of the  aortic valve. Aortic valve regurgitation is trivial. Mild aortic valve  stenosis. Vmax 2.2 m/s, MG , AVA 1.6 cm^2, DI 0.5   5. Aortic dilatation noted. There is dilatation of the ascending aorta,  measuring 43 mm.   6. The inferior vena cava is normal in size with greater than 50%  respiratory variability, suggesting right atrial pressure of 3 mmHg.  CTA Coronary 03/19/22:  MPRESSION: 1. Coronary calcium score of 231. 2. Normal coronary origin with right dominance. 3. Evidence of mild ascending aortic dilation, 42 mm. Consider secondary imaging modality (echocardiogram, CTA Aorta Protocol, MRA Aorta Protocol) in one year  follow up if clinically indicated. 4. CAD-RADS 2. Mild non-obstructive CAD (25-49%). Consider non-atherosclerotic causes of chest pain. Consider preventive therapy and risk factor modification.  Risk Assessment/Calculations:    CHA2DS2-VASc Score = 4   This indicates a 4.8% annual risk of stroke. The patient's score is based upon: CHF History: 0 HTN History: 1 Diabetes History: 0 Stroke History: 0 Vascular Disease History: 1 Age Score: 2 Gender Score: 0             Physical Exam:   VS:  BP 136/78   Pulse (!) 59   Ht 6' (1.829 m)   Wt 210 lb (95.3 kg)   SpO2 98%   BMI 28.48 kg/m    Wt Readings from Last 3 Encounters:  03/16/24 210 lb (95.3 kg)  01/02/24 205 lb (93 kg)  10/14/23 206 lb 6.4 oz (93.6 kg)     GEN: Well nourished, well developed in no acute distress NECK: No JVD CARDIAC: Normal rate, regular rhythm. Soft systolic murmur.  RESPIRATORY:  Clear to auscultation without rales, wheezing or rhonchi  ABDOMEN: Soft, non-distended EXTREMITIES:  No edema; No deformity   ASSESSMENT AND PLAN:    #.  Paroxysmal atrial fibrillation: #.  High risk medication use: Antiarrhythmic drug therapy - flecainide.  EKG with sinus bradycardia, stable first degree AV delay and narrow QRS.  -Continue flecainide 100 mg twice daily. -Continue metoprolol XL 25 mg once daily.  #.  Secondary hypercoagulable state due to atrial fibrillation: CHADSVASC score of 4. -Continue Eliquis 5 mg twice daily.   #. Hypertension -At goal today.  Recommend checking blood pressures 1-2 times per week at home and recording the values.  Recommend bringing these recordings to the primary care physician.  #.  Ascending aortic dilatation: Patient had CTA in 2023 which showed sending aorta 42 mm.  Echocardiogram was performed in January of this year which measured 43 mm. -Continue annual surveillance.  Follow up with Dr. Jimmey Ralph in 6 months  Signed, Nobie Putnam, MD

## 2024-03-16 ENCOUNTER — Ambulatory Visit: Payer: Medicare Other | Attending: Cardiology | Admitting: Cardiology

## 2024-03-16 ENCOUNTER — Encounter: Payer: Self-pay | Admitting: Cardiology

## 2024-03-16 VITALS — BP 136/78 | HR 59 | Ht 72.0 in | Wt 210.0 lb

## 2024-03-16 DIAGNOSIS — Z79899 Other long term (current) drug therapy: Secondary | ICD-10-CM | POA: Diagnosis not present

## 2024-03-16 DIAGNOSIS — D6869 Other thrombophilia: Secondary | ICD-10-CM | POA: Diagnosis not present

## 2024-03-16 DIAGNOSIS — I480 Paroxysmal atrial fibrillation: Secondary | ICD-10-CM | POA: Diagnosis not present

## 2024-03-16 DIAGNOSIS — I10 Essential (primary) hypertension: Secondary | ICD-10-CM | POA: Diagnosis not present

## 2024-03-16 DIAGNOSIS — I77810 Thoracic aortic ectasia: Secondary | ICD-10-CM

## 2024-03-16 NOTE — Patient Instructions (Signed)
 Medication Instructions:  Your physician recommends that you continue on your current medications as directed. Please refer to the Current Medication list given to you today.  *If you need a refill on your cardiac medications before your next appointment, please call your pharmacy*  Follow-Up: At [facility], you and your health needs are our priority.  As part of our continuing mission to provide you with exceptional heart care, our providers are all part of one team.  This team includes your primary Cardiologist (physician) and Advanced Practice Providers or APPs (Physician Assistants and Nurse Practitioners) who all work together to provide you with the care you need, when you need it.  Your next appointment:   6 months  Provider:   You will see one of the following Advanced Practice Providers on your designated Care Team:   Francis Dowse, [place] Casimiro Needle "Mardelle Matte" [place], PA-C Sherie Don, NP Canary Brim, NP        1st Floor: - Lobby - Registration  - Pharmacy  - Lab - Cafe  2nd Floor: - PV Lab - Diagnostic Testing (echo, CT, nuclear med)  3rd Floor: - Vacant  4th Floor: - TCTS (cardiothoracic surgery) - AFib Clinic - Structural Heart Clinic - Vascular Surgery  - Vascular Ultrasound  5th Floor: - HeartCare Cardiology (general and EP) - Clinical Pharmacy for coumadin, hypertension, lipid, weight-loss medications, and med management appointments    Valet parking services will be available as well.

## 2024-03-28 DIAGNOSIS — L578 Other skin changes due to chronic exposure to nonionizing radiation: Secondary | ICD-10-CM | POA: Diagnosis not present

## 2024-03-28 DIAGNOSIS — L570 Actinic keratosis: Secondary | ICD-10-CM | POA: Diagnosis not present

## 2024-03-28 DIAGNOSIS — D0461 Carcinoma in situ of skin of right upper limb, including shoulder: Secondary | ICD-10-CM | POA: Diagnosis not present

## 2024-03-28 DIAGNOSIS — D225 Melanocytic nevi of trunk: Secondary | ICD-10-CM | POA: Diagnosis not present

## 2024-03-28 DIAGNOSIS — L821 Other seborrheic keratosis: Secondary | ICD-10-CM | POA: Diagnosis not present

## 2024-03-28 DIAGNOSIS — D485 Neoplasm of uncertain behavior of skin: Secondary | ICD-10-CM | POA: Diagnosis not present

## 2024-03-28 DIAGNOSIS — L720 Epidermal cyst: Secondary | ICD-10-CM | POA: Diagnosis not present

## 2024-03-28 DIAGNOSIS — Z85828 Personal history of other malignant neoplasm of skin: Secondary | ICD-10-CM | POA: Diagnosis not present

## 2024-04-10 DIAGNOSIS — C44729 Squamous cell carcinoma of skin of left lower limb, including hip: Secondary | ICD-10-CM | POA: Diagnosis not present

## 2024-04-10 DIAGNOSIS — L309 Dermatitis, unspecified: Secondary | ICD-10-CM | POA: Diagnosis not present

## 2024-04-12 DIAGNOSIS — M1712 Unilateral primary osteoarthritis, left knee: Secondary | ICD-10-CM | POA: Diagnosis not present

## 2024-04-12 DIAGNOSIS — M25562 Pain in left knee: Secondary | ICD-10-CM | POA: Diagnosis not present

## 2024-04-19 DIAGNOSIS — M1712 Unilateral primary osteoarthritis, left knee: Secondary | ICD-10-CM | POA: Diagnosis not present

## 2024-04-24 DIAGNOSIS — T8149XA Infection following a procedure, other surgical site, initial encounter: Secondary | ICD-10-CM | POA: Diagnosis not present

## 2024-04-26 DIAGNOSIS — M1712 Unilateral primary osteoarthritis, left knee: Secondary | ICD-10-CM | POA: Diagnosis not present

## 2024-05-17 ENCOUNTER — Other Ambulatory Visit: Payer: Self-pay | Admitting: Family Medicine

## 2024-06-14 DIAGNOSIS — D0461 Carcinoma in situ of skin of right upper limb, including shoulder: Secondary | ICD-10-CM | POA: Diagnosis not present

## 2024-06-14 DIAGNOSIS — L570 Actinic keratosis: Secondary | ICD-10-CM | POA: Diagnosis not present

## 2024-09-11 ENCOUNTER — Other Ambulatory Visit: Payer: Self-pay | Admitting: Cardiology

## 2024-09-11 DIAGNOSIS — I480 Paroxysmal atrial fibrillation: Secondary | ICD-10-CM

## 2024-09-11 NOTE — Telephone Encounter (Signed)
 Prescription refill request for David Bush  received. Indication: PAF Last office visit: 03/16/24  David Kitty MD Scr: 1.41 on 10/10/23  Epic Age: 89 Weight: 95.3kg  Based on above findings David Bush  5mg  twice daily is the appropriate dose.  Refill approved.

## 2024-10-02 DIAGNOSIS — L820 Inflamed seborrheic keratosis: Secondary | ICD-10-CM | POA: Diagnosis not present

## 2024-10-02 DIAGNOSIS — C44622 Squamous cell carcinoma of skin of right upper limb, including shoulder: Secondary | ICD-10-CM | POA: Diagnosis not present

## 2024-10-02 DIAGNOSIS — C44529 Squamous cell carcinoma of skin of other part of trunk: Secondary | ICD-10-CM | POA: Diagnosis not present

## 2024-10-02 DIAGNOSIS — L570 Actinic keratosis: Secondary | ICD-10-CM | POA: Diagnosis not present

## 2024-10-02 DIAGNOSIS — D485 Neoplasm of uncertain behavior of skin: Secondary | ICD-10-CM | POA: Diagnosis not present

## 2024-10-02 DIAGNOSIS — D229 Melanocytic nevi, unspecified: Secondary | ICD-10-CM | POA: Diagnosis not present

## 2024-10-11 ENCOUNTER — Other Ambulatory Visit

## 2024-10-11 ENCOUNTER — Ambulatory Visit (INDEPENDENT_AMBULATORY_CARE_PROVIDER_SITE_OTHER)

## 2024-10-11 DIAGNOSIS — R972 Elevated prostate specific antigen [PSA]: Secondary | ICD-10-CM | POA: Diagnosis not present

## 2024-10-11 DIAGNOSIS — Z0000 Encounter for general adult medical examination without abnormal findings: Secondary | ICD-10-CM

## 2024-10-11 DIAGNOSIS — E7800 Pure hypercholesterolemia, unspecified: Secondary | ICD-10-CM | POA: Diagnosis not present

## 2024-10-11 DIAGNOSIS — I10 Essential (primary) hypertension: Secondary | ICD-10-CM | POA: Diagnosis not present

## 2024-10-11 DIAGNOSIS — Z23 Encounter for immunization: Secondary | ICD-10-CM

## 2024-10-11 NOTE — Progress Notes (Signed)
 Patient is in office today for a nurse visit for Immunization. Patient Injection was given in the  Left deltoid. Patient tolerated injection well.

## 2024-10-12 ENCOUNTER — Ambulatory Visit: Payer: Self-pay | Admitting: Family Medicine

## 2024-10-12 LAB — CBC WITH DIFFERENTIAL/PLATELET
Absolute Lymphocytes: 1477 {cells}/uL (ref 850–3900)
Absolute Monocytes: 560 {cells}/uL (ref 200–950)
Basophils Absolute: 49 {cells}/uL (ref 0–200)
Basophils Relative: 0.7 %
Eosinophils Absolute: 182 {cells}/uL (ref 15–500)
Eosinophils Relative: 2.6 %
HCT: 41.2 % (ref 38.5–50.0)
Hemoglobin: 13.6 g/dL (ref 13.2–17.1)
MCH: 31.7 pg (ref 27.0–33.0)
MCHC: 33 g/dL (ref 32.0–36.0)
MCV: 96 fL (ref 80.0–100.0)
MPV: 10 fL (ref 7.5–12.5)
Monocytes Relative: 8 %
Neutro Abs: 4732 {cells}/uL (ref 1500–7800)
Neutrophils Relative %: 67.6 %
Platelets: 198 Thousand/uL (ref 140–400)
RBC: 4.29 Million/uL (ref 4.20–5.80)
RDW: 12 % (ref 11.0–15.0)
Total Lymphocyte: 21.1 %
WBC: 7 Thousand/uL (ref 3.8–10.8)

## 2024-10-12 LAB — LIPID PANEL
Cholesterol: 109 mg/dL (ref <200)
HDL: 37 mg/dL — ABNORMAL LOW (ref >=40)
LDL Cholesterol (Calc): 48 mg/dL
Non-HDL Cholesterol (Calc): 72 mg/dL (ref <130)
Total CHOL/HDL Ratio: 2.9 (calc) (ref <5.0)
Triglycerides: 159 mg/dL — ABNORMAL HIGH (ref <150)

## 2024-10-12 LAB — COMPLETE METABOLIC PANEL WITHOUT GFR
AG Ratio: 2 (calc) (ref 1.0–2.5)
ALT: 14 U/L (ref 9–46)
AST: 19 U/L (ref 10–35)
Albumin: 4.1 g/dL (ref 3.6–5.1)
Alkaline phosphatase (APISO): 60 U/L (ref 35–144)
BUN/Creatinine Ratio: 12 (calc) (ref 6–22)
BUN: 18 mg/dL (ref 7–25)
CO2: 28 mmol/L (ref 20–32)
Calcium: 8.8 mg/dL (ref 8.6–10.3)
Chloride: 103 mmol/L (ref 98–110)
Creat: 1.46 mg/dL — ABNORMAL HIGH (ref 0.70–1.22)
Globulin: 2.1 g/dL (ref 1.9–3.7)
Glucose, Bld: 107 mg/dL — ABNORMAL HIGH (ref 65–99)
Potassium: 4.6 mmol/L (ref 3.5–5.3)
Sodium: 138 mmol/L (ref 135–146)
Total Bilirubin: 0.5 mg/dL (ref 0.2–1.2)
Total Protein: 6.2 g/dL (ref 6.1–8.1)

## 2024-10-12 LAB — PSA: PSA: 4.2 ng/mL — ABNORMAL HIGH (ref <=4.00)

## 2024-10-13 ENCOUNTER — Other Ambulatory Visit: Payer: Self-pay | Admitting: Family Medicine

## 2024-10-13 DIAGNOSIS — I10 Essential (primary) hypertension: Secondary | ICD-10-CM

## 2024-10-15 NOTE — Telephone Encounter (Signed)
 Requested Prescriptions  Pending Prescriptions Disp Refills   benazepril  (LOTENSIN ) 40 MG tablet [Pharmacy Med Name: BENAZEPRIL  HCL TABS 40MG ] 90 tablet 0    Sig: TAKE 1 TABLET DAILY     Cardiovascular:  ACE Inhibitors Failed - 10/15/2024  1:30 PM      Failed - Cr in normal range and within 180 days    Creat  Date Value Ref Range Status  10/11/2024 1.46 (H) 0.70 - 1.22 mg/dL Final         Failed - Valid encounter within last 6 months    Recent Outpatient Visits           9 months ago Elevated PSA   [place] 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

## 2024-10-16 ENCOUNTER — Ambulatory Visit: Admitting: Family Medicine

## 2024-10-16 ENCOUNTER — Encounter: Payer: Self-pay | Admitting: Family Medicine

## 2024-10-16 VITALS — BP 128/72 | HR 73 | Temp 97.8°F | Ht 72.0 in | Wt 201.2 lb

## 2024-10-16 DIAGNOSIS — Z0000 Encounter for general adult medical examination without abnormal findings: Secondary | ICD-10-CM

## 2024-10-16 DIAGNOSIS — E756 Lipid storage disorder, unspecified: Secondary | ICD-10-CM | POA: Insufficient documentation

## 2024-10-16 DIAGNOSIS — E7800 Pure hypercholesterolemia, unspecified: Secondary | ICD-10-CM

## 2024-10-16 DIAGNOSIS — K219 Gastro-esophageal reflux disease without esophagitis: Secondary | ICD-10-CM | POA: Insufficient documentation

## 2024-10-16 DIAGNOSIS — I480 Paroxysmal atrial fibrillation: Secondary | ICD-10-CM

## 2024-10-16 DIAGNOSIS — I10 Essential (primary) hypertension: Secondary | ICD-10-CM

## 2024-10-16 DIAGNOSIS — F1021 Alcohol dependence, in remission: Secondary | ICD-10-CM | POA: Insufficient documentation

## 2024-10-16 DIAGNOSIS — K2100 Gastro-esophageal reflux disease with esophagitis, without bleeding: Secondary | ICD-10-CM | POA: Insufficient documentation

## 2024-10-16 NOTE — Progress Notes (Addendum)
 Subjective:    Patient ID: David Bush Alas, male    DOB: 11-04-1934, 89 y.o.   MRN: [id number]  HPI Patient is a very pleasant 89 year old Caucasian gentleman here today for complete physical exam.  Patient's PSA has risen.  It increased from 2.7 2023 to 3.7 2024 to 4.2 now.  He denies any hematuria or dysuria or urgency or frequency.  Given his advanced age, we discussed this.  I would recommend that we just monitor this at the present time.  As long as his PSA is not rising rapidly I would not recommend any treatment.  Patient is comfortable with this plan.  Due to his age he does not require colonoscopy.  He denies any falls, depression, or memory loss.  He is due for a COVID shot.  He is also due for the RSV vaccine.  He denies any chest pain shortness of breath or dyspnea on exertion.  Lab work as listed below and shows stable chronic kidney disease, mild elevation in his sugar and triglycerides, and the elevation in the PSA discussed earlier Immunization History  Administered Date(s) Administered   Fluad Quad(high Dose 65+) 09/28/2019, 09/01/2020, 10/05/2021, 10/08/2022   Fluad Trivalent(High Dose 65+) 10/14/2023   INFLUENZA, HIGH DOSE SEASONAL PF 10/02/1996, 08/25/2017, 10/11/2024   Influenza,inj,Quad PF,6+ Mos 08/31/2016, 08/30/2018   Influenza-Unspecified 10/06/1997, 10/03/1998, 11/21/2000, 10/13/2001, 10/04/2002, 09/12/2003, 10/07/2004, 09/05/2013   PFIZER Comirnaty(Gray Top)Covid-19 Tri-Sucrose Vaccine 04/13/2021   PFIZER(Purple Top)SARS-COV-2 Vaccination 12/31/2019, 01/21/2020, 09/08/2020   Pneumococcal Conjugate-13 10/01/2016   Pneumococcal Polysaccharide-23 12/06/2013   Pneumococcal-Unspecified 08/28/2001   Lab on 10/11/2024  Component Date Value Ref Range Status   WBC 10/11/2024 7.0  3.8 - 10.8 Thousand/uL Final   RBC 10/11/2024 4.29  4.20 - 5.80 Million/uL Final   Hemoglobin 10/11/2024 13.6  13.2 - 17.1 g/dL Final   HCT 88/93/7974 41.2  38.5 - 50.0 % Final   MCV  10/11/2024 96.0  80.0 - 100.0 fL Final   MCH 10/11/2024 31.7  27.0 - 33.0 pg Final   MCHC 10/11/2024 33.0  32.0 - 36.0 g/dL Final   Comment: For adults, a slight decrease in the calculated MCHC value (in the range of 30 to 32 g/dL) is most likely not clinically significant; however, it should be interpreted with caution in correlation with other red cell parameters and the patient's clinical condition.    RDW 10/11/2024 12.0  11.0 - 15.0 % Final   Platelets 10/11/2024 198  140 - 400 Thousand/uL Final   MPV 10/11/2024 10.0  7.5 - 12.5 fL Final   Neutro Abs 10/11/2024 4,732  1,500 - 7,800 cells/uL Final   Absolute Lymphocytes 10/11/2024 1,477  850 - 3,900 cells/uL Final   Absolute Monocytes 10/11/2024 560  200 - 950 cells/uL Final   Eosinophils Absolute 10/11/2024 182  15 - 500 cells/uL Final   Basophils Absolute 10/11/2024 49  0 - 200 cells/uL Final   Neutrophils Relative % 10/11/2024 67.6  % Final   Total Lymphocyte 10/11/2024 21.1  % Final   Monocytes Relative 10/11/2024 8.0  % Final   Eosinophils Relative 10/11/2024 2.6  % Final   Basophils Relative 10/11/2024 0.7  % Final   Glucose, Bld 10/11/2024 107 (H)  65 - 99 mg/dL Final   Comment: .            Fasting reference interval . For someone without known diabetes, a glucose value between 100 and 125 mg/dL is consistent with prediabetes and should be confirmed with a follow-up test. .  BUN 10/11/2024 18  7 - 25 mg/dL Final   Creat 88/93/7974 1.46 (H)  0.70 - 1.22 mg/dL Final   BUN/Creatinine Ratio 10/11/2024 12  6 - 22 (calc) Final   Sodium 10/11/2024 138  135 - 146 mmol/L Final   Potassium 10/11/2024 4.6  3.5 - 5.3 mmol/L Final   Chloride 10/11/2024 103  98 - 110 mmol/L Final   CO2 10/11/2024 28  20 - 32 mmol/L Final   Calcium  10/11/2024 8.8  8.6 - 10.3 mg/dL Final   Total Protein 88/93/7974 6.2  6.1 - 8.1 g/dL Final   Albumin 88/93/7974 4.1  3.6 - 5.1 g/dL Final   Globulin 88/93/7974 2.1  1.9 - 3.7 g/dL (calc) Final    AG Ratio 10/11/2024 2.0  1.0 - 2.5 (calc) Final   Total Bilirubin 10/11/2024 0.5  0.2 - 1.2 mg/dL Final   Alkaline phosphatase (APISO) 10/11/2024 60  35 - 144 U/L Final   AST 10/11/2024 19  10 - 35 U/L Final   ALT 10/11/2024 14  9 - 46 U/L Final   Cholesterol 10/11/2024 109  <200 mg/dL Final   HDL 88/93/7974 37 (L)  > OR = 40 mg/dL Final   Triglycerides 88/93/7974 159 (H)  <150 mg/dL Final   LDL Cholesterol (Calc) 10/11/2024 48  mg/dL (calc) Final   Comment: Reference range: <100 . Desirable range <100 mg/dL for primary prevention;   <70 mg/dL for patients with CHD or diabetic patients  with > or = 2 CHD risk factors. SABRA LDL-C is now calculated using the Martin-Hopkins  calculation, which is a validated novel method providing  better accuracy than the Friedewald equation in the  estimation of LDL-C.  Gladis APPLETHWAITE et al. SANDREA. 7986;689(80): 2061-2068  (http://education.[facility].com/faq/FAQ164)    Total CHOL/HDL Ratio 10/11/2024 2.9  <4.9 (calc) Final   Non-HDL Cholesterol (Calc) 10/11/2024 72  <130 mg/dL (calc) Final   Comment: For patients with diabetes plus 1 major ASCVD risk  factor, treating to a non-HDL-C goal of <100 mg/dL  (LDL-C of <29 mg/dL) is considered a therapeutic  option.    PSA 10/11/2024 4.20 (H)  < OR = 4.00 ng/mL Final   Comment: The total PSA value from this assay system is  standardized against the WHO standard. The test  result will be approximately 20% lower when compared  to the equimolar-standardized total PSA (Beckman  Coulter). Comparison of serial PSA results should be  interpreted with this fact in mind. . This test was performed using the Siemens  chemiluminescent method. Values obtained from  different assay methods cannot be used interchangeably. PSA levels, regardless of value, should not be interpreted as absolute evidence of the presence or absence of disease.     Past Medical History:  Diagnosis Date   Atrial fibrillation (HCC)     Barrett's esophagus    Cataract    surgery to remove bilateral   Chest pain    in feb 2020/ saw cardiologist- no heart attack!   CKD (chronic kidney disease) stage 3, GFR 30-59 ml/min (HCC)    GERD (gastroesophageal reflux disease)    Hx of hiatal hernia    Hyperlipidemia    Hypertension    Skin cancer    squamous cell of face/arm/legs   Substance abuse (HCC)    stopped drinking in 1993   Past Surgical History:  Procedure Laterality Date   CATARACT EXTRACTION, BILATERAL  2012   COLONOSCOPY     KNEE ARTHROSCOPY Left 2010   torn meniscus  SKIN CANCER EXCISION  2015, 2012   face, arms and legs   UPPER GASTROINTESTINAL ENDOSCOPY     barrett's esophagus   WISDOM TOOTH EXTRACTION  1980   WISDOM TOOTH EXTRACTION     Current Outpatient Medications on File Prior to Visit  Medication Sig Dispense Refill   acetaminophen (TYLENOL) 325 MG tablet Take 650 mg by mouth every 6 (six) hours as needed for moderate pain or headache.     amLODipine  (NORVASC ) 5 MG tablet TAKE 1 TABLET DAILY 90 tablet 3   apixaban  (ELIQUIS ) 5 MG TABS tablet Take 1 tablet (5 mg total) by mouth 2 (two) times daily. 180 tablet 1   benazepril  (LOTENSIN ) 40 MG tablet TAKE 1 TABLET DAILY 90 tablet 0   pantoprazole  (PROTONIX ) 40 MG tablet TAKE 1 TABLET TWICE A DAY 180 tablet 3   azithromycin  (ZITHROMAX ) 250 MG tablet 2 tabs poqday1, 1 tab poqday 2-5 (Patient not taking: Reported on 03/16/2024) 6 tablet 0   finasteride  (PROSCAR ) 5 MG tablet TAKE 1 TABLET DAILY (Patient not taking: Reported on 03/16/2024) 90 tablet 3   flecainide  (TAMBOCOR ) 100 MG tablet TAKE AS INSTRUCTED BY YOUR PRESCRIBER (Patient not taking: Reported on 03/16/2024) 180 tablet 3   HYDROcodone  bit-homatropine (HYCODAN) 5-1.5 MG/5ML syrup Take 5 mLs by mouth every 8 (eight) hours as needed for cough. (Patient not taking: Reported on 03/16/2024) 120 mL 0   metoprolol  succinate (TOPROL -XL) 25 MG 24 hr tablet TAKE 1 TABLET DAILY (Patient not taking: Reported on  03/16/2024) 90 tablet 3   Multiple Vitamins-Minerals (CENTRUM SILVER PO) Take 1 tablet by mouth daily. (Patient not taking: Reported on 03/16/2024)     nitroGLYCERIN  (NITROSTAT ) 0.4 MG SL tablet Place 1 tablet (0.4 mg total) under the tongue every 5 (five) minutes as needed for chest pain. 25 tablet 3   rosuvastatin  (CRESTOR ) 20 MG tablet TAKE 1 TABLET DAILY (WILL NEED TO SCHEDULE AN OFFICE VISIT FOR FURTHER REFILLS) (Patient not taking: Reported on 03/16/2024) 90 tablet 3   sucralfate  (CARAFATE ) 1 g tablet Take 1 tablet (1 g total) by mouth 4 (four) times daily -  with meals and at bedtime. (Patient not taking: Reported on 03/16/2024) 120 tablet 1   No current facility-administered medications on file prior to visit.   Allergies  Allergen Reactions   Diltiazem  Itching and Rash   Social History   Socioeconomic History   Marital status: Married    Spouse name: Not on file   Number of children: 1   Years of education: Not on file   Highest education level: Some college, no degree  Occupational History   Not on file  Tobacco Use   Smoking status: Former    Current packs/day: 0.00    Types: Cigarettes    Start date: 12/06/1950    Quit date: 12/06/1966    Years since quitting: 57.9   Smokeless tobacco: Never  Vaping Use   Vaping status: Never Used  Substance and Sexual Activity   Alcohol  use: No    Alcohol /week: 0.0 standard drinks of alcohol     Comment: 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   Drug use: No   Sexual activity: Not on file  Other Topics Concern   Not on file  Social History Narrative   Not on file   Social Drivers of Health   Financial Resource Strain: Low Risk  (12/31/2023)   Overall Financial Resource Strain (CARDIA)    Difficulty of Paying Living Expenses: Not hard at all  Food Insecurity: No Food Insecurity (12/31/2023)   Hunger Vital Sign    Worried About Running Out of Food in the Last Year: Never true    Ran Out of Food in the Last Year: Never true   Transportation Needs: No Transportation Needs (12/31/2023)   PRAPARE - Administrator, Civil Service (Medical): No    Lack of Transportation (Non-Medical): No  Physical Activity: Insufficiently Active (12/31/2023)   Exercise Vital Sign    Days of Exercise per Week: 3 days    Minutes of Exercise per Session: 40 min  Stress: No Stress Concern Present (12/31/2023)   [employer] of Occupational Health - Occupational Stress Questionnaire    Feeling of Stress : Not at all  Social Connections: Socially Integrated (12/31/2023)   Social Connection and Isolation Panel    Frequency of Communication with Friends and Family: More than three times a week    Frequency of Social Gatherings with Friends and Family: Three times a week    Attends Religious Services: More than 4 times per year    Active Member of Clubs or Organizations: Yes    Attends Banker Meetings: More than 4 times per year    Marital Status: Married  Catering Manager Violence: Unknown (03/12/2022)   Received from [facility]   HITS    Physically Hurt: Not on file    Insult or Talk Down To: Not on file    Threaten Physical Harm: Not on file    Scream or Curse: Not on file   Family History  Problem Relation Age of Onset   Esophageal cancer Mother    Stroke Father    Kidney cancer Sister    Kidney disease Brother    Other Brother    Heart disease Brother    Colon cancer Neg Hx    Colon polyps Neg Hx    Rectal cancer Neg Hx    Stomach cancer Neg Hx       Review of Systems  All other systems reviewed and are negative.      Objective:   Physical Exam Vitals reviewed.  Constitutional:      General: He is not in acute distress.    Appearance: He is well-developed. He is not diaphoretic.  HENT:     Head: Normocephalic and atraumatic.     Right Ear: External ear normal.     Left Ear: External ear normal.     Nose: Nose normal.     Mouth/Throat:     Pharynx: No oropharyngeal exudate.   Eyes:     General: No scleral icterus.       Right eye: No discharge.        Left eye: No discharge.     Conjunctiva/sclera: Conjunctivae normal.     Pupils: Pupils are equal, round, and reactive to light.  Neck:     Thyroid : No thyromegaly.     Vascular: No JVD.     Trachea: No tracheal deviation.  Cardiovascular:     Rate and Rhythm: Normal rate and regular rhythm.     Heart sounds: Murmur heard.     No friction rub. No gallop.  Pulmonary:     Effort: Pulmonary effort is normal. No respiratory distress.     Breath sounds: Normal breath sounds. No stridor. No wheezing or rales.  Chest:     Chest wall: No tenderness.  Abdominal:     General: Bowel sounds are normal. There is no distension.  Palpations: Abdomen is soft. There is no mass.     Tenderness: There is no abdominal tenderness. There is no guarding or rebound.  Genitourinary:    Penis: Normal.      Prostate: Normal.     Rectum: Normal.  Musculoskeletal:        General: No deformity.     Cervical back: Normal range of motion and neck supple.  Lymphadenopathy:     Cervical: No cervical adenopathy.  Skin:    General: Skin is warm.     Coloration: Skin is not pale.     Findings: No erythema or rash.  Neurological:     Mental Status: He is alert and oriented to person, place, and time.     Cranial Nerves: No cranial nerve deficit.     Motor: No abnormal muscle tone.     Coordination: Coordination normal.     Deep Tendon Reflexes: Reflexes are normal and symmetric.  Psychiatric:        Behavior: Behavior normal.        Thought Content: Thought content normal.        Judgment: Judgment normal.           Assessment & Plan:  General medical exam  Paroxysmal atrial fibrillation (HCC)  Pure hypercholesterolemia  Primary hypertension Patient has already had his flu shot.  I recommended COVID and RSV.  His PSA is elevated.  I explained the patient there is a high likelihood that he has a slow-growing  prostate cancer but at the present time seems to be growing slowly.  I recommended checking a PSA again in 6 months.  As long as there is not a rapid change, I would not recommend further workup.  Blood pressure and cholesterol are outstanding.  We did discuss a low carbohydrate diet to help lower his blood sugar triglycerides.  Kidney function is stable.  Patient does have a squamous cell carcinoma at the base of the neck on the right side but he has already seen dermatology and they are planning excision of this.  Regarding his paroxysmal atrial fibrillation, he is in normal sinus rhythm but still appropriately anticoagulated.

## 2024-10-22 DIAGNOSIS — D485 Neoplasm of uncertain behavior of skin: Secondary | ICD-10-CM | POA: Diagnosis not present

## 2024-10-22 DIAGNOSIS — L821 Other seborrheic keratosis: Secondary | ICD-10-CM | POA: Diagnosis not present

## 2024-10-22 DIAGNOSIS — B078 Other viral warts: Secondary | ICD-10-CM | POA: Diagnosis not present

## 2024-10-22 DIAGNOSIS — D225 Melanocytic nevi of trunk: Secondary | ICD-10-CM | POA: Diagnosis not present

## 2024-10-22 DIAGNOSIS — L578 Other skin changes due to chronic exposure to nonionizing radiation: Secondary | ICD-10-CM | POA: Diagnosis not present

## 2024-10-22 DIAGNOSIS — L570 Actinic keratosis: Secondary | ICD-10-CM | POA: Diagnosis not present

## 2024-10-22 DIAGNOSIS — Z85828 Personal history of other malignant neoplasm of skin: Secondary | ICD-10-CM | POA: Diagnosis not present

## 2024-10-30 DIAGNOSIS — C44529 Squamous cell carcinoma of skin of other part of trunk: Secondary | ICD-10-CM | POA: Diagnosis not present

## 2024-11-07 ENCOUNTER — Ambulatory Visit

## 2024-11-07 VITALS — Ht 72.0 in | Wt 201.0 lb

## 2024-11-07 DIAGNOSIS — Z0000 Encounter for general adult medical examination without abnormal findings: Secondary | ICD-10-CM

## 2024-11-07 NOTE — Patient Instructions (Signed)
 [name],  Thank you for taking the time for your Medicare Wellness Visit. I appreciate your continued commitment to your health goals. Please review the care plan we discussed, and feel free to reach out if I can assist you further.  Please note that Annual Wellness Visits do not include a physical exam. Some assessments may be limited, especially if the visit was conducted virtually. If needed, we may recommend an in-person follow-up with your provider.  Ongoing Care Seeing your primary care provider every 3 to 6 months helps us  monitor your health and provide consistent, personalized care.   Referrals If a referral was made during today's visit and you haven't received any updates within two weeks, please contact the referred provider directly to check on the status.  Recommended Screenings:  Health Maintenance  Topic Date Due   COVID-19 Vaccine (5 - 2025-26 season) 08/06/2024   Zoster (Shingles) Vaccine (1 of 2) 01/16/2025*   Medicare Annual Wellness Visit  11/07/2025   Pneumococcal Vaccine for age over 80  Completed   Flu Shot  Completed   Meningitis B Vaccine  Aged Out   DTaP/Tdap/Td vaccine  Discontinued  *Topic was postponed. The date shown is not the original due date.       11/07/2024    8:36 AM  Advanced Directives  Does Patient Have a Medical Advance Directive? No  Would patient like information on creating a medical advance directive? Yes (MAU/Ambulatory/Procedural Areas - Information given)   Information on Advanced Care Planning can be found at   Secretary of [facility] Advance Health Care Directives Advance Health Care Directives ([email or web address]    Vision: Annual vision screenings are recommended for early detection of glaucoma, cataracts, and diabetic retinopathy. These exams can also reveal signs of chronic conditions such as diabetes and high blood pressure.  Dental: Annual dental screenings help detect early signs of oral cancer, gum disease, and  other conditions linked to overall health, including heart disease and diabetes.  Please see the attached documents for additional preventive care recommendations.

## 2024-11-07 NOTE — Progress Notes (Signed)
 Chief Complaint  Patient presents with   Medicare Wellness     Subjective:   David Bush is a 89 y.o. male who presents for a Medicare Annual Wellness Visit.  Visit info / Clinical Intake: Medicare Wellness Visit Type:: Subsequent Annual Wellness Visit Persons participating in visit and providing information:: patient Medicare Wellness Visit Mode:: Telephone If telephone:: video declined Since this visit was completed virtually, some vitals may be partially provided or unavailable. Missing vitals are due to the limitations of the virtual format.: Documented vitals are patient reported If Telephone or Video please confirm:: I connected with patient using audio/video enable telemedicine. I verified patient identity with two identifiers, discussed telehealth limitations, and patient agreed to proceed. Patient Location:: home Provider Location:: office Interpreter Needed?: No Pre-visit prep was completed: yes AWV questionnaire completed by patient prior to visit?: no Living arrangements:: lives with spouse/significant other Patient's Overall Health Status Rating: good Typical amount of pain: some Does pain affect daily life?: no Are you currently prescribed opioids?: no  Dietary Habits and Nutritional Risks How many meals a day?: 3 Eats fruit and vegetables daily?: yes Most meals are obtained by: having others provide food In the last 2 weeks, have you had any of the following?: none Diabetic:: no  Functional Status Activities of Daily Living (to include ambulation/medication): Independent Ambulation: Independent Medication Administration: Independent Home Management (perform basic housework or laundry): Independent Manage your own finances?: yes Primary transportation is: driving Concerns about vision?: no *vision screening is required for WTM* Concerns about hearing?: no  Fall Screening Falls in the past year?: 0 Number of falls in past year: 0 Was there an  injury with Fall?: 0 Fall Risk Category Calculator: 0 Patient Fall Risk Level: Low Fall Risk  Fall Risk Patient at Risk for Falls Due to: No Fall Risks Fall risk Follow up: Falls evaluation completed; Education provided; Falls prevention discussed  Home and Transportation Safety: All rugs have non-skid backing?: yes All stairs or steps have railings?: yes Grab bars in the bathtub or shower?: yes Have non-skid surface in bathtub or shower?: yes Good home lighting?: yes Regular seat belt use?: yes Hospital stays in the last year:: no  Cognitive Assessment Difficulty concentrating, remembering, or making decisions? : no Will 6CIT or Mini Cog be Completed: no 6CIT or Mini Cog Declined: patient alert, oriented, able to answer questions appropriately and recall recent events  Advance Directives (For Healthcare) Does Patient Have a Medical Advance Directive?: No Would patient like information on creating a medical advance directive?: Yes (MAU/Ambulatory/Procedural Areas - Information given)  Reviewed/Updated  Reviewed/Updated: Reviewed All (Medical, Surgical, Family, Medications, Allergies, Care Teams, Patient Goals)    Allergies (verified) Diltiazem    Current Medications (verified) Outpatient Encounter Medications as of 11/07/2024  Medication Sig   acetaminophen (TYLENOL) 325 MG tablet Take 650 mg by mouth every 6 (six) hours as needed for moderate pain or headache.   amLODipine  (NORVASC ) 5 MG tablet TAKE 1 TABLET DAILY   apixaban  (ELIQUIS ) 5 MG TABS tablet Take 1 tablet (5 mg total) by mouth 2 (two) times daily.   benazepril  (LOTENSIN ) 40 MG tablet TAKE 1 TABLET DAILY   finasteride  (PROSCAR ) 5 MG tablet TAKE 1 TABLET DAILY   flecainide  (TAMBOCOR ) 100 MG tablet TAKE AS INSTRUCTED BY YOUR PRESCRIBER   HYDROcodone  bit-homatropine (HYCODAN) 5-1.5 MG/5ML syrup Take 5 mLs by mouth every 8 (eight) hours as needed for cough.   metoprolol  succinate (TOPROL -XL) 25 MG 24 hr tablet TAKE 1  TABLET  DAILY   Multiple Vitamins-Minerals (CENTRUM SILVER PO) Take 1 tablet by mouth daily.   pantoprazole  (PROTONIX ) 40 MG tablet TAKE 1 TABLET TWICE A DAY   rosuvastatin  (CRESTOR ) 20 MG tablet TAKE 1 TABLET DAILY (WILL NEED TO SCHEDULE AN OFFICE VISIT FOR FURTHER REFILLS)   nitroGLYCERIN  (NITROSTAT ) 0.4 MG SL tablet Place 1 tablet (0.4 mg total) under the tongue every 5 (five) minutes as needed for chest pain. (Patient not taking: Reported on 11/07/2024)   sucralfate  (CARAFATE ) 1 g tablet Take 1 tablet (1 g total) by mouth 4 (four) times daily -  with meals and at bedtime. (Patient not taking: Reported on 11/07/2024)   No facility-administered encounter medications on file as of 11/07/2024.    History: Past Medical History:  Diagnosis Date   Atrial fibrillation (HCC)    Barrett's esophagus    Cataract    surgery to remove bilateral   Chest pain    in feb 2020/ saw cardiologist- no heart attack!   CKD (chronic kidney disease) stage 3, GFR 30-59 ml/min (HCC)    GERD (gastroesophageal reflux disease)    Hx of hiatal hernia    Hyperlipidemia    Hypertension    Skin cancer    squamous cell of face/arm/legs   Substance abuse (HCC)    stopped drinking in 1993   Past Surgical History:  Procedure Laterality Date   CATARACT EXTRACTION, BILATERAL  2012   COLONOSCOPY     KNEE ARTHROSCOPY Left 2010   torn meniscus   SKIN CANCER EXCISION  2015, 2012   face, arms and legs   UPPER GASTROINTESTINAL ENDOSCOPY     barrett's esophagus   WISDOM TOOTH EXTRACTION  1980   WISDOM TOOTH EXTRACTION     Family History  Problem Relation Age of Onset   Esophageal cancer Mother    Stroke Father    Kidney cancer Sister    Kidney disease Brother    Other Brother    Heart disease Brother    Colon cancer Neg Hx    Colon polyps Neg Hx    Rectal cancer Neg Hx    Stomach cancer Neg Hx    Social History   Occupational History   Not on file  Tobacco Use   Smoking status: Former    Current  packs/day: 0.00    Types: Cigarettes    Start date: 12/06/1950    Quit date: 12/06/1966    Years since quitting: 57.9   Smokeless tobacco: Never  Vaping Use   Vaping status: Never Used  Substance and Sexual Activity   Alcohol  use: No    Alcohol /week: 0.0 standard drinks of alcohol     Comment: 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   Drug use: No   Sexual activity: Not on file   Tobacco Counseling Counseling given: Not Answered  SDOH Screenings   Food Insecurity: No Food Insecurity (11/07/2024)  Housing: Low Risk  (11/07/2024)  Transportation Needs: No Transportation Needs (11/07/2024)  Utilities: Not At Risk (11/07/2024)  Alcohol  Screen: Low Risk  (10/05/2021)  Depression (PHQ2-9): Low Risk  (11/07/2024)  Financial Resource Strain: Low Risk  (12/31/2023)  Physical Activity: Insufficiently Active (11/07/2024)  Social Connections: Socially Integrated (11/07/2024)  Stress: No Stress Concern Present (11/07/2024)  Tobacco Use: Medium Risk (11/07/2024)  Health Literacy: Adequate Health Literacy (11/07/2024)   See flowsheets for full screening details  Depression Screen PHQ 2 & 9 Depression Scale- Over the past 2 weeks, how often have you been bothered by any  of the following problems? Little interest or pleasure in doing things: 0 Feeling down, depressed, or hopeless (PHQ Adolescent also includes...irritable): 0 PHQ-2 Total Score: 0 Trouble falling or staying asleep, or sleeping too much: 0 Feeling tired or having little energy: 0 Poor appetite or overeating (PHQ Adolescent also includes...weight loss): 0 Feeling bad about yourself - or that you are a failure or have let yourself or your family down: 0 Trouble concentrating on things, such as reading the newspaper or watching television (PHQ Adolescent also includes...like school work): 0 Moving or speaking so slowly that other people could have noticed. Or the opposite - being so fidgety or restless that you have been moving around  a lot more than usual: 0 Thoughts that you would be better off dead, or of hurting yourself in some way: 0 PHQ-9 Total Score: 0 If you checked off any problems, how difficult have these problems made it for you to do your work, take care of things at home, or get along with other people?: Not difficult at all     Goals Addressed             This Visit's Progress    Remain active and independent   On track            Objective:    Today's Vitals   11/07/24 0815  Weight: 201 lb (91.2 kg)  Height: 6' (1.829 m)   Body mass index is 27.26 kg/m.  Hearing/Vision screen Hearing Screening - Comments:: Patient is able to hear conversational tones without difficulty. No issues reported.   Vision Screening - Comments::  up to date with routine eye exams   Immunizations and Health Maintenance Health Maintenance  Topic Date Due   COVID-19 Vaccine (5 - 2025-26 season) 08/06/2024   Zoster Vaccines- Shingrix (1 of 2) 01/16/2025 (Originally 04/29/1953)   Medicare Annual Wellness (AWV)  11/07/2025   Pneumococcal Vaccine: 50+ Years  Completed   Influenza Vaccine  Completed   Meningococcal B Vaccine  Aged Out   DTaP/Tdap/Td  Discontinued        Assessment/Plan:  This is a routine wellness examination for Bowyn.  Patient Care Team: Duanne Butler DASEN, MD as PCP - General (Family Medicine) Kennyth Chew, MD as PCP - Electrophysiology (Cardiology) Robinson Pao, MD as Consulting Physician (Dermatology) Kristian Stabs, [place] (Orthopedic Surgery)  I have personally reviewed and noted the following in the patient's chart:   Medical and social history Use of alcohol , tobacco or illicit drugs  Current medications and supplements including opioid prescriptions. Functional ability and status Nutritional status Physical activity Advanced directives List of other physicians Hospitalizations, surgeries, and ER visits in previous 12 months Vitals Screenings to include cognitive,  depression, and falls Referrals and appointments  No orders of the defined types were placed in this encounter.  In addition, I have reviewed and discussed with patient certain preventive protocols, quality metrics, and best practice recommendations. A written personalized care plan for preventive services as well as general preventive health recommendations were provided to patient.   Lavelle Charmaine Browner, LPN   [id number]   Return in 1 year (on 11/07/2025).  After Visit Summary: (MyChart) Due to this being a telephonic visit, the after visit summary with patients personalized plan was offered to patient via MyChart   Nurse Notes: No concerns at this time

## 2024-11-08 DIAGNOSIS — M1712 Unilateral primary osteoarthritis, left knee: Secondary | ICD-10-CM | POA: Diagnosis not present

## 2024-11-15 ENCOUNTER — Other Ambulatory Visit: Payer: Self-pay | Admitting: Family Medicine

## 2024-11-15 DIAGNOSIS — M1712 Unilateral primary osteoarthritis, left knee: Secondary | ICD-10-CM | POA: Diagnosis not present

## 2024-11-15 DIAGNOSIS — I10 Essential (primary) hypertension: Secondary | ICD-10-CM

## 2024-12-28 ENCOUNTER — Other Ambulatory Visit: Payer: Self-pay | Admitting: Family Medicine

## 2024-12-28 DIAGNOSIS — I10 Essential (primary) hypertension: Secondary | ICD-10-CM

## 2025-01-10 ENCOUNTER — Other Ambulatory Visit: Payer: Self-pay | Admitting: Cardiology

## 2025-01-11 ENCOUNTER — Other Ambulatory Visit: Payer: Self-pay | Admitting: Cardiology

## 2025-01-11 DIAGNOSIS — I480 Paroxysmal atrial fibrillation: Secondary | ICD-10-CM

## 2025-04-15 ENCOUNTER — Other Ambulatory Visit
